# Patient Record
Sex: Female | Born: 1935 | Race: White | Hispanic: No | Smoking: Former smoker
Health system: Southern US, Community
[De-identification: ages and names within clinical notes are randomized; demographics above are authoritative.]

## PROBLEM LIST (undated history)

## (undated) DIAGNOSIS — E039 Hypothyroidism, unspecified: Secondary | ICD-10-CM

## (undated) DIAGNOSIS — K5792 Diverticulitis of intestine, part unspecified, without perforation or abscess without bleeding: Secondary | ICD-10-CM

## (undated) DIAGNOSIS — M549 Dorsalgia, unspecified: Secondary | ICD-10-CM

## (undated) DIAGNOSIS — K219 Gastro-esophageal reflux disease without esophagitis: Secondary | ICD-10-CM

## (undated) DIAGNOSIS — I10 Essential (primary) hypertension: Secondary | ICD-10-CM

## (undated) DIAGNOSIS — F0390 Unspecified dementia without behavioral disturbance: Secondary | ICD-10-CM

## (undated) DIAGNOSIS — I5032 Chronic diastolic (congestive) heart failure: Secondary | ICD-10-CM

## (undated) DIAGNOSIS — N289 Disorder of kidney and ureter, unspecified: Secondary | ICD-10-CM

## (undated) DIAGNOSIS — Z7401 Bed confinement status: Secondary | ICD-10-CM

## (undated) DIAGNOSIS — Z950 Presence of cardiac pacemaker: Secondary | ICD-10-CM

## (undated) DIAGNOSIS — F419 Anxiety disorder, unspecified: Secondary | ICD-10-CM

## (undated) DIAGNOSIS — E785 Hyperlipidemia, unspecified: Secondary | ICD-10-CM

## (undated) DIAGNOSIS — M40209 Unspecified kyphosis, site unspecified: Secondary | ICD-10-CM

## (undated) DIAGNOSIS — I495 Sick sinus syndrome: Secondary | ICD-10-CM

## (undated) DIAGNOSIS — K589 Irritable bowel syndrome without diarrhea: Secondary | ICD-10-CM

## (undated) DIAGNOSIS — I509 Heart failure, unspecified: Secondary | ICD-10-CM

## (undated) HISTORY — PX: CARDIAC PACEMAKER PLACEMENT: SHX583

## (undated) HISTORY — PX: APPENDECTOMY: SHX54

## (undated) HISTORY — PX: COLECTOMY: SHX59

## (undated) HISTORY — DX: Essential (primary) hypertension: I10

## (undated) HISTORY — PX: OTHER SURGICAL HISTORY: SHX169

## (undated) HISTORY — PX: ABDOMINAL HYSTERECTOMY: SHX81

## (undated) HISTORY — DX: Gastro-esophageal reflux disease without esophagitis: K21.9

## (undated) HISTORY — DX: Irritable bowel syndrome, unspecified: K58.9

## (undated) HISTORY — DX: Presence of cardiac pacemaker: Z95.0

## (undated) HISTORY — PX: PACEMAKER PLACEMENT: SHX43

## (undated) HISTORY — DX: Sick sinus syndrome: I49.5

## (undated) HISTORY — DX: Hypothyroidism, unspecified: E03.9

## (undated) HISTORY — DX: Unspecified kyphosis, site unspecified: M40.209

## (undated) HISTORY — PX: CHOLECYSTECTOMY: SHX55

## (undated) HISTORY — DX: Hyperlipidemia, unspecified: E78.5

## (undated) HISTORY — DX: Diverticulitis of intestine, part unspecified, without perforation or abscess without bleeding: K57.92

## (undated) HISTORY — DX: Chronic diastolic (congestive) heart failure: I50.32

## (undated) HISTORY — PX: ECTOPIC PREGNANCY SURGERY: SHX613

## (undated) HISTORY — DX: Dorsalgia, unspecified: M54.9

---

## 2000-06-07 ENCOUNTER — Ambulatory Visit (HOSPITAL_COMMUNITY): Admission: RE | Admit: 2000-06-07 | Discharge: 2000-06-07 | Payer: Self-pay | Admitting: *Deleted

## 2000-06-07 ENCOUNTER — Encounter: Payer: Self-pay | Admitting: *Deleted

## 2000-07-18 ENCOUNTER — Ambulatory Visit (HOSPITAL_COMMUNITY): Admission: RE | Admit: 2000-07-18 | Discharge: 2000-07-18 | Payer: Self-pay | Admitting: Family Medicine

## 2000-07-18 ENCOUNTER — Encounter: Payer: Self-pay | Admitting: Family Medicine

## 2001-02-13 HISTORY — PX: COLONOSCOPY: SHX174

## 2001-10-30 ENCOUNTER — Ambulatory Visit (HOSPITAL_COMMUNITY): Admission: RE | Admit: 2001-10-30 | Discharge: 2001-10-30 | Payer: Self-pay | Admitting: Family Medicine

## 2001-10-30 ENCOUNTER — Encounter: Payer: Self-pay | Admitting: Family Medicine

## 2001-11-19 ENCOUNTER — Ambulatory Visit (HOSPITAL_COMMUNITY): Admission: RE | Admit: 2001-11-19 | Discharge: 2001-11-19 | Payer: Self-pay | Admitting: General Surgery

## 2002-08-25 ENCOUNTER — Ambulatory Visit (HOSPITAL_COMMUNITY): Admission: RE | Admit: 2002-08-25 | Discharge: 2002-08-25 | Payer: Self-pay | Admitting: *Deleted

## 2002-09-01 ENCOUNTER — Encounter (HOSPITAL_COMMUNITY): Admission: RE | Admit: 2002-09-01 | Discharge: 2002-10-01 | Payer: Self-pay | Admitting: *Deleted

## 2003-04-27 ENCOUNTER — Emergency Department (HOSPITAL_COMMUNITY): Admission: EM | Admit: 2003-04-27 | Discharge: 2003-04-27 | Payer: Self-pay | Admitting: Emergency Medicine

## 2003-10-24 ENCOUNTER — Emergency Department (HOSPITAL_COMMUNITY): Admission: EM | Admit: 2003-10-24 | Discharge: 2003-10-24 | Payer: Self-pay | Admitting: Emergency Medicine

## 2004-11-07 ENCOUNTER — Emergency Department (HOSPITAL_COMMUNITY): Admission: EM | Admit: 2004-11-07 | Discharge: 2004-11-07 | Payer: Self-pay | Admitting: Emergency Medicine

## 2004-11-21 ENCOUNTER — Ambulatory Visit (HOSPITAL_COMMUNITY): Admission: RE | Admit: 2004-11-21 | Discharge: 2004-11-21 | Payer: Self-pay | Admitting: Family Medicine

## 2005-05-22 ENCOUNTER — Ambulatory Visit (HOSPITAL_COMMUNITY): Admission: RE | Admit: 2005-05-22 | Discharge: 2005-05-22 | Payer: Self-pay | Admitting: Family Medicine

## 2005-05-26 ENCOUNTER — Ambulatory Visit: Payer: Self-pay | Admitting: Cardiology

## 2005-06-26 ENCOUNTER — Ambulatory Visit: Payer: Self-pay | Admitting: Cardiology

## 2005-07-06 ENCOUNTER — Ambulatory Visit: Payer: Self-pay | Admitting: Cardiology

## 2005-07-11 ENCOUNTER — Ambulatory Visit: Payer: Self-pay | Admitting: Internal Medicine

## 2005-07-11 ENCOUNTER — Inpatient Hospital Stay (HOSPITAL_COMMUNITY): Admission: RE | Admit: 2005-07-11 | Discharge: 2005-07-12 | Payer: Self-pay | Admitting: Internal Medicine

## 2005-07-13 ENCOUNTER — Ambulatory Visit: Payer: Self-pay | Admitting: *Deleted

## 2005-07-17 ENCOUNTER — Encounter (HOSPITAL_COMMUNITY): Admission: RE | Admit: 2005-07-17 | Discharge: 2005-08-16 | Payer: Self-pay | Admitting: Internal Medicine

## 2005-07-20 ENCOUNTER — Ambulatory Visit: Payer: Self-pay | Admitting: *Deleted

## 2005-07-26 ENCOUNTER — Ambulatory Visit: Payer: Self-pay

## 2005-10-31 ENCOUNTER — Ambulatory Visit: Payer: Self-pay | Admitting: Internal Medicine

## 2006-05-24 ENCOUNTER — Ambulatory Visit (HOSPITAL_COMMUNITY): Admission: RE | Admit: 2006-05-24 | Discharge: 2006-05-24 | Payer: Self-pay | Admitting: Family Medicine

## 2006-05-24 ENCOUNTER — Ambulatory Visit: Payer: Self-pay | Admitting: Cardiology

## 2006-06-27 ENCOUNTER — Ambulatory Visit (HOSPITAL_COMMUNITY): Admission: RE | Admit: 2006-06-27 | Discharge: 2006-06-27 | Payer: Self-pay | Admitting: Ophthalmology

## 2006-07-12 ENCOUNTER — Ambulatory Visit (HOSPITAL_COMMUNITY): Admission: RE | Admit: 2006-07-12 | Discharge: 2006-07-12 | Payer: Self-pay | Admitting: Ophthalmology

## 2006-08-22 ENCOUNTER — Ambulatory Visit: Payer: Self-pay | Admitting: Internal Medicine

## 2006-10-19 ENCOUNTER — Ambulatory Visit (HOSPITAL_COMMUNITY): Admission: RE | Admit: 2006-10-19 | Discharge: 2006-10-19 | Payer: Self-pay | Admitting: Family Medicine

## 2006-11-21 ENCOUNTER — Ambulatory Visit: Payer: Self-pay | Admitting: Internal Medicine

## 2007-02-20 ENCOUNTER — Ambulatory Visit: Payer: Self-pay | Admitting: Internal Medicine

## 2007-04-19 ENCOUNTER — Ambulatory Visit: Payer: Self-pay | Admitting: Internal Medicine

## 2007-07-17 ENCOUNTER — Ambulatory Visit: Payer: Self-pay | Admitting: Internal Medicine

## 2007-07-19 ENCOUNTER — Ambulatory Visit: Payer: Self-pay | Admitting: Cardiology

## 2007-07-24 ENCOUNTER — Ambulatory Visit: Payer: Self-pay | Admitting: Cardiology

## 2007-09-05 ENCOUNTER — Ambulatory Visit: Payer: Self-pay | Admitting: Cardiology

## 2007-10-16 ENCOUNTER — Ambulatory Visit: Payer: Self-pay | Admitting: Internal Medicine

## 2007-10-16 ENCOUNTER — Ambulatory Visit (HOSPITAL_COMMUNITY): Admission: RE | Admit: 2007-10-16 | Discharge: 2007-10-16 | Payer: Self-pay | Admitting: Internal Medicine

## 2007-12-02 ENCOUNTER — Ambulatory Visit: Payer: Self-pay | Admitting: Cardiology

## 2008-01-15 ENCOUNTER — Ambulatory Visit: Payer: Self-pay | Admitting: Internal Medicine

## 2008-01-28 ENCOUNTER — Ambulatory Visit (HOSPITAL_COMMUNITY): Admission: RE | Admit: 2008-01-28 | Discharge: 2008-01-28 | Payer: Self-pay | Admitting: Internal Medicine

## 2008-04-10 ENCOUNTER — Encounter: Payer: Self-pay | Admitting: Internal Medicine

## 2008-04-15 ENCOUNTER — Ambulatory Visit: Payer: Self-pay | Admitting: Internal Medicine

## 2008-06-24 ENCOUNTER — Ambulatory Visit (HOSPITAL_COMMUNITY): Admission: RE | Admit: 2008-06-24 | Discharge: 2008-06-24 | Payer: Self-pay | Admitting: Internal Medicine

## 2008-07-15 ENCOUNTER — Ambulatory Visit: Payer: Self-pay | Admitting: Internal Medicine

## 2008-07-23 ENCOUNTER — Encounter: Payer: Self-pay | Admitting: Internal Medicine

## 2008-10-04 ENCOUNTER — Emergency Department (HOSPITAL_COMMUNITY): Admission: EM | Admit: 2008-10-04 | Discharge: 2008-10-04 | Payer: Self-pay | Admitting: Emergency Medicine

## 2008-10-14 ENCOUNTER — Encounter: Payer: Self-pay | Admitting: Internal Medicine

## 2008-10-14 ENCOUNTER — Ambulatory Visit: Payer: Self-pay | Admitting: Internal Medicine

## 2008-11-19 ENCOUNTER — Encounter: Payer: Self-pay | Admitting: Internal Medicine

## 2009-01-20 ENCOUNTER — Encounter: Payer: Self-pay | Admitting: Internal Medicine

## 2009-01-28 ENCOUNTER — Encounter: Payer: Self-pay | Admitting: Internal Medicine

## 2009-04-30 ENCOUNTER — Ambulatory Visit (HOSPITAL_COMMUNITY): Admission: RE | Admit: 2009-04-30 | Discharge: 2009-04-30 | Payer: Self-pay | Admitting: Family Medicine

## 2009-06-02 DIAGNOSIS — F17200 Nicotine dependence, unspecified, uncomplicated: Secondary | ICD-10-CM

## 2009-06-02 DIAGNOSIS — E785 Hyperlipidemia, unspecified: Secondary | ICD-10-CM

## 2009-06-02 DIAGNOSIS — E039 Hypothyroidism, unspecified: Secondary | ICD-10-CM | POA: Insufficient documentation

## 2009-06-02 DIAGNOSIS — I10 Essential (primary) hypertension: Secondary | ICD-10-CM

## 2009-06-09 ENCOUNTER — Ambulatory Visit: Payer: Self-pay | Admitting: Internal Medicine

## 2009-06-09 DIAGNOSIS — Z950 Presence of cardiac pacemaker: Secondary | ICD-10-CM | POA: Insufficient documentation

## 2009-06-10 ENCOUNTER — Encounter: Payer: Self-pay | Admitting: Internal Medicine

## 2009-08-26 ENCOUNTER — Ambulatory Visit (HOSPITAL_COMMUNITY): Admission: RE | Admit: 2009-08-26 | Discharge: 2009-08-26 | Payer: Self-pay | Admitting: Family Medicine

## 2009-09-10 ENCOUNTER — Encounter: Payer: Self-pay | Admitting: Internal Medicine

## 2009-10-07 ENCOUNTER — Encounter: Payer: Self-pay | Admitting: Orthopedic Surgery

## 2009-10-14 ENCOUNTER — Encounter: Payer: Self-pay | Admitting: Orthopedic Surgery

## 2009-10-24 ENCOUNTER — Emergency Department (HOSPITAL_COMMUNITY): Admission: EM | Admit: 2009-10-24 | Discharge: 2009-10-24 | Payer: Self-pay | Admitting: Emergency Medicine

## 2009-11-13 ENCOUNTER — Encounter: Payer: Self-pay | Admitting: Orthopedic Surgery

## 2010-01-16 ENCOUNTER — Encounter: Payer: Self-pay | Admitting: Internal Medicine

## 2010-01-17 ENCOUNTER — Ambulatory Visit: Payer: Self-pay | Admitting: Cardiology

## 2010-01-17 ENCOUNTER — Ambulatory Visit: Payer: Self-pay | Admitting: Internal Medicine

## 2010-01-18 ENCOUNTER — Encounter: Payer: Self-pay | Admitting: Adult Health

## 2010-01-28 ENCOUNTER — Encounter: Payer: Self-pay | Admitting: Internal Medicine

## 2010-03-06 ENCOUNTER — Encounter: Payer: Self-pay | Admitting: Internal Medicine

## 2010-03-15 ENCOUNTER — Ambulatory Visit
Admission: RE | Admit: 2010-03-15 | Discharge: 2010-03-15 | Payer: Self-pay | Source: Home / Self Care | Attending: Gastroenterology | Admitting: Gastroenterology

## 2010-03-15 DIAGNOSIS — K589 Irritable bowel syndrome without diarrhea: Secondary | ICD-10-CM | POA: Insufficient documentation

## 2010-03-15 DIAGNOSIS — R1013 Epigastric pain: Secondary | ICD-10-CM | POA: Insufficient documentation

## 2010-03-15 NOTE — Assessment & Plan Note (Signed)
Summary: F2Y  Medications Added ASPIR-LOW 81 MG TBEC (ASPIRIN) take 1 tab daily ATENOLOL 50 MG TABS (ATENOLOL) take 1 tab daily PRILOSEC 20 MG CPDR (OMEPRAZOLE) take 1 tab daily      Allergies Added: NKDA  Visit Type:  Follow-up Primary Climmie Cronce:  Dr.Fusco  CC:  some chest pain.  History of Present Illness: Kari Hall is a pleasant 76 y/o CF we are following for continued assessement and treatment of SSS s/p pacemaker implantation by Dr. Graciela Husbands on 07/11/2005, hypertension, former tobacco abuse who has been lost to cardiac follow-up with the exception of pacemaker checks.  She is followed by Dr. Sherwood Gambler for cholesterol checks and thyroid assessement with known history of hypothyroidism.  She comes today as a walk-in  secondary to recurrent chest discomfort around the pacemaker site when she lays on her left side.  She denies chest discomfort when she is exerting herself, none that wakes her up, no associated symptoms of SOB, dizziness, or diaphoresis. This has concerned her over the last couple of weeks. She says that she is doing rehb exercises for chronic back pain in the setting of kyphosis. Since starting these exercises, the chest discomfort has begun.  Current Medications (verified): 1)  Levoxyl 75 Mcg Tabs (Levothyroxine Sodium) .... Take 1 Tab Daily 2)  Vytorin 10-40 Mg Tabs (Ezetimibe-Simvastatin) .... Take 1 Tab Daily 3)  Multivitamins  Caps (Multiple Vitamin) .... Take 1 Tab Daily 4)  Aspir-Low 81 Mg Tbec (Aspirin) .... Take 1 Tab Daily 5)  Atenolol 50 Mg Tabs (Atenolol) .... Take 1 Tab Daily 6)  Prilosec 20 Mg Cpdr (Omeprazole) .... Take 1 Tab Daily  Allergies (verified): No Known Drug Allergies  Comments:  Nurse/Medical Assistant: patient didn't bring meds or list stated all meds she uses the [facility]  we reviewed meds from her ov with gregg taylor  Past History:  Past medical, surgical, family and social histories (including risk factors) reviewed, and no  changes noted (except as noted below).  Past Medical History: Reviewed history from 06/02/2009 and no changes required. Current Problems:  TOBACCO ABUSE (ICD-305.1) DYSLIPIDEMIA (ICD-272.4) HYPERTENSION (ICD-401.9) HYPOTHYROIDISM (ICD-244.9)  Past Surgical History: Reviewed history from 06/02/2009 and no changes required. cholecystectomy hysterectomy appendectomy colectomy  tubal pregnancy  Family History: Reviewed history and no changes required.  Social History: Reviewed history from 06/02/2009 and no changes required. Full Time Tobacco Use - Former.  Alcohol Use - no Regular Exercise - no Drug Use - no  Review of Systems       All other systems have been reviewed and are negative unless stated above.   Vital Signs:  Patient profile:   76 year old female Weight:      179 pounds BMI:     29.00 O2 Sat:      94 % on Room air Pulse rate:   74 / minute BP sitting:   160 / 87  (right arm)  Vitals Entered By: Dreama Saa, CNA (January 17, 2010 2:54 PM)  O2 Flow:  Room air  Physical Exam  General:  Well developed, well nourished, in no acute distress. Chest Wall:  Tender over pacemaker site on palpation. Lungs:  Clear bilaterally to auscultation and percussion. Heart:  Non-displaced PMI, chest non-tender; regular rate and rhythm, S1, S2 without murmurs, rubs or gallops. Carotid upstroke normal, no bruit. Normal abdominal aortic size, no bruits. Femorals normal pulses, no bruits. Pedals normal pulses. No edema, no varicosities. Abdomen:  Bowel sounds positive; abdomen soft and  non-tender without masses, organomegaly, or hernias noted. No hepatosplenomegaly. Msk:  Kyphosis with mild scolosis to the Left. Pulses:  pulses normal in all 4 extremities Extremities:  No clubbing or cyanosis. Neurologic:  Alert and oriented x 3. Psych:  anxious.     EKG  Procedure date:  01/17/2010  Findings:       Atrium and ventricle are paced.    PPM  Specifications Following MD:  Lewayne Bunting, MD     PPM Vendor:  Medtronic     PPM Model Number:  [id number]     PPM Serial Number:  [id number] PPM DOI:  07/11/2005     PPM Implanting MD:  Sherryl Manges, MD  Lead 1    Location: [facility]     DOI: 07/11/2005     Model #: 7829     Serial #: [id number]     Status: active Lead 2    Location: RV     DOI: 07/11/2005     Model #: [id number]     Serial #: [id number]     Status: active  Magnet Response Rate:  BOL 85 ERI  65  Indications:  Sinus node dysfunction  Explantation Comments:  [facility]link  PPM Follow Up Pacer Dependent:  No      Parameters Mode:  DDDR     Lower Rate Limit:  50     Upper Rate Limit:  130 Paced AV Delay:  350     Sensed AV Delay:  350 Rate Response Parameters:  ADL repsonse reprogrammed 2  Impression & Recommendations:  Problem # 1:  CARDIAC PACEMAKER IN SITU (ICD-V45.01) She is experiencing pain over the pacemaker site on palpation and lying on left side, relieved with changing sides when she sleeps.  She appears to be having musculoskeletal pain and not cardiac pain.  Will give ultram 50mg  tablets for pain relief and have her see Dr. Ladona Ridgel for follow-up on his next available.  If pain is persistant, can consider stress test for evaluation of ischemia.  Problem # 2:  HYPERTENSION (ICD-401.9) Currently elevated, but patient has been in a hurry to get her on time.  Will check again on next appointment and make no changes in medications at this time. Her updated medication list for this problem includes:    Aspir-low 81 Mg Tbec (Aspirin) .Marland Kitchen... Take 1 tab daily    Atenolol 50 Mg Tabs (Atenolol) .Marland Kitchen... Take 1 tab daily  Appended Document: F2Y    Clinical Lists Changes  Medications: Added new medication of ULTRAM 50 MG TABS (TRAMADOL HCL) take 1 to 2 tablets at bedtime by mouth as needed for pain - Signed Rx of ULTRAM 50 MG TABS (TRAMADOL HCL) take 1 to 2 tablets at bedtime by mouth as needed for pain;  #60 x 0;  Signed;  Entered by: Larita Fife  Via LPN;  Authorized by: Joni Reining, NP;  Method used: Print then Give to Patient    Prescriptions: ULTRAM 50 MG TABS (TRAMADOL HCL) take 1 to 2 tablets at bedtime by mouth as needed for pain  #60 x 0   Entered by:   Larita Fife Via LPN   Authorized by:   Joni Reining, NP   Signed by:   Larita Fife Via LPN on 44/02/270   Method used:   Print then Give to Patient   RxID:   [id number]

## 2010-03-15 NOTE — Assessment & Plan Note (Signed)
Summary: 1 YR PACER CK PER CKOUT 04/15/08-DSF  Medications Added DRISDOL 65784 UNIT CAPS (ERGOCALCIFEROL) take one by mouth weekly      Allergies Added: NKDA  Visit Type:  Pacemaker check Primary Provider:  [facility]  CC:  pacer check.  History of Present Illness: Ms. Helfrich returns today for followup. She is a pleasant 76 yo woman with a h/o symptomatic bradycardia, s/p PPM, and HTN.  She denies c/p or sob.  She does have some fatigue with exertion. Minimal peripheral edema.  Preventive Screening-Counseling & Management  Alcohol-Tobacco     Smoking Status: quit     Year Quit: 1970's  Current Medications (verified): 1)  Levoxyl 75 Mcg Tabs (Levothyroxine Sodium) .... Take 1 Tab Daily 2)  Vytorin 10-40 Mg Tabs (Ezetimibe-Simvastatin) .... Take 1 Tab Daily 3)  Multivitamins  Caps (Multiple Vitamin) .... Take 1 Tab Daily 4)  Drisdol 69629 Unit Caps (Ergocalciferol) .... Take One By Mouth Weekly  Allergies (verified): No Known Drug Allergies  Comments:  Nurse/Medical Assistant: The patient's medications and allergies were reviewed with the patient and were updated in the Medication and Allergy Lists. Verbally gave names.  Past History:  Past Medical History: Last updated: 06/02/2009 Current Problems:  TOBACCO ABUSE (ICD-305.1) DYSLIPIDEMIA (ICD-272.4) HYPERTENSION (ICD-401.9) HYPOTHYROIDISM (ICD-244.9)  Past Surgical History: Last updated: 06/02/2009 cholecystectomy hysterectomy appendectomy colectomy  tubal pregnancy  Review of Systems  The patient denies chest pain, syncope, dyspnea on exertion, and peripheral edema.    Vital Signs:  Patient profile:   76 year old female Height:      66 inches Weight:      181 pounds BMI:     29.32 Pulse rate:   79 / minute BP sitting:   127 / 71  (left arm) Cuff size:   large  Vitals Entered By: Roxy Cedar  Nutrition Counseling: Patient's BMI is greater than 25 and therefore counseled on weight management  options. CC: pacer check   Physical Exam  General:  Well developed, well nourished, in no acute distress.  HEENT: normal Neck: supple. No JVD. Carotids 2+ bilaterally no bruits Cor: RRR no rubs, gallops or murmur Lungs: CTA. Well healed PPM incision. Ab: soft, nontender. nondistended. No HSM. Good bowel sounds Ext: warm. no cyanosis, clubbing or edema Neuro: alert and oriented. Grossly nonfocal. affect pleasant    PPM Specifications Following MD:  Lewayne Bunting, MD     PPM Vendor:  [employer]tronic     PPM Model Number:  [id number]     PPM Serial Number:  [id number] PPM DOI:  07/11/2005     PPM Implanting MD:  Sherryl Manges, MD  Lead 1    Location: RA     DOI: 07/11/2005     Model #: [id number]     Serial #: [id number]     Status: active Lead 2    Location: RV     DOI: 07/11/2005     Model #: 4742     Serial #: [id number]     Status: active  Magnet Response Rate:  BOL 85 ERI  65  Indications:  Sinus node dysfunction  Explantation Comments:  Carelink  PPM Follow Up Remote Check?  No Battery Voltage:  2.78 V     Battery Est. Longevity:  5.5 years     Pacer Dependent:  No       PPM Device Measurements Atrium  Amplitude: 5.6 mV, Impedance: 426 ohms, Threshold: 0.625 V at 0.4 msec Right Ventricle  Amplitude: 11.2 mV, Impedance:  546 ohms, Threshold: 0.75 V at 0.4 msec  Episodes MS Episodes:  2     Percent Mode Switch:  <0.1%     Ventricular High Rate:  0     Atrial Pacing:  70.1%     Ventricular Pacing:  86.7%  Parameters Mode:  DDDR     Lower Rate Limit:  50     Upper Rate Limit:  130 Paced AV Delay:  350     Sensed AV Delay:  350 Rate Response Parameters:  ADL repsonse reprogrammed 2 Next Remote Date:  09/09/2009     Next Cardiology Appt Due:  05/15/2010 Tech Comments:  c/o lack of energy, ADL response reprogrammed.  AV delay 350/350 with base rate lowered to 50.  Carelink transmissions every 3 hours.   ROV 1 year with Dr. Ladona Ridgel in [place]. Altha Harm, LPN  June 09, 2009 4:00 PM  MD  Comments:  Agree with above.  Impression & Recommendations:  Problem # 1:  CARDIAC PACEMAKER IN SITU (ICD-V45.01) Her device is working normally today.  I have reduced her lower rate to help promote intrinsic conduction. Will recheck in several months.  Problem # 2:  TOBACCO ABUSE (ICD-305.1) smoking cessation is encouraged.  Problem # 3:  HYPERTENSION (ICD-401.9) A low salt diet and continue medical therapy are recommended.

## 2010-03-15 NOTE — Letter (Signed)
Summary: Device-Delinquent Phone Journalist, newspaper, Main Office  [address]   [place], [place] [address]   Phone: [phone]  Fax: [phone]     September 10, 2009 MRN: [id number]   Kari Hall [address] [place], [place]  [address]   Dear Ms. DEVINNEY,  According to our records, you were scheduled for a device phone transmission on 09-09-2009 .     We did not receive any results from this check.  If you transmitted on your scheduled day, please call us to help troubleshoot your system.  If you forgot to send your transmission, please send one upon receipt of this letter.  Thank you,   Arch[facility]ural [facility]

## 2010-03-16 ENCOUNTER — Encounter: Payer: Self-pay | Admitting: Gastroenterology

## 2010-03-17 ENCOUNTER — Encounter: Payer: Self-pay | Admitting: Gastroenterology

## 2010-03-17 ENCOUNTER — Ambulatory Visit: Admit: 2010-03-17 | Payer: Self-pay | Admitting: Internal Medicine

## 2010-03-17 ENCOUNTER — Encounter: Payer: Self-pay | Admitting: Internal Medicine

## 2010-03-17 ENCOUNTER — Encounter (INDEPENDENT_AMBULATORY_CARE_PROVIDER_SITE_OTHER): Payer: PRIVATE HEALTH INSURANCE | Admitting: Internal Medicine

## 2010-03-17 DIAGNOSIS — I10 Essential (primary) hypertension: Secondary | ICD-10-CM

## 2010-03-17 DIAGNOSIS — I495 Sick sinus syndrome: Secondary | ICD-10-CM

## 2010-03-17 NOTE — Letter (Signed)
Summary: Remote Device Check  [employer], Main Office  [address]   [place], [place] [address]   Phone: [phone]  Fax: [phone]     January 28, 2010 MRN: [id number]   Kari Hall [address] [place], [place]  [address]   Dear Ms. VAWTER,   Your remote transmission was recieved and reviewed by your physician.  All diagnostics were within normal limits for you.   ___X___Your next office visit is scheduled for:   03-17-10 @ 1045 with Dr Ladona Ridgel.    Sincerely,  Vella Kohler

## 2010-03-17 NOTE — Cardiovascular Report (Signed)
Summary: Office Visit Remote   Office Visit Remote   Imported By: Roderic Ovens 01/31/2010 16:02:52  _____________________________________________________________________  External Attachment:    Type:   Image     Comment:   External Document

## 2010-03-21 LAB — CONVERTED CEMR LAB
Albumin: 4.1 g/dL (ref 3.5–5.2)
Calcium: 8.5 mg/dL (ref 8.4–10.5)
Creatinine, Ser: 0.97 mg/dL (ref 0.40–1.20)
Eosinophils Absolute: 0.2 10*3/uL (ref 0.0–0.7)
Eosinophils Relative: 4 % (ref 0–5)
Glucose, Bld: 90 mg/dL (ref 70–99)
HCT: 39.9 % (ref 36.0–46.0)
Lymphs Abs: 1.5 10*3/uL (ref 0.7–4.0)
MCHC: 32.8 g/dL (ref 30.0–36.0)
MCV: 93.9 fL (ref 78.0–100.0)
Neutrophils Relative %: 52 % (ref 43–77)
Potassium: 3.8 meq/L (ref 3.5–5.3)
RBC: 4.25 M/uL (ref 3.87–5.11)
Sodium: 144 meq/L (ref 135–145)
Total Protein: 6.2 g/dL (ref 6.0–8.3)
WBC: 4.6 10*3/uL (ref 4.0–10.5)

## 2010-03-23 NOTE — Letter (Signed)
Summary: REF BY [facility]  REF BY [facility]   Imported By: Rexene Alberts 03/16/2010 14:35:02  _____________________________________________________________________  External Attachment:    Type:   Image     Comment:   External Document

## 2010-03-23 NOTE — Letter (Signed)
Summary: AUTH FOR THE RELEASE OF MEDICAL INFO  AUTH FOR THE RELEASE OF MEDICAL INFO   Imported By: Rexene Alberts 03/16/2010 11:07:59  _____________________________________________________________________  External Attachment:    Type:   Image     Comment:   External Document

## 2010-03-23 NOTE — Assessment & Plan Note (Addendum)
Summary: IBS/SS   Visit Type:  Initial Consult Referring Provider:  [facility] Primary Care Provider:  Dr.Fusco  CC:  abd pain and loose stools.  History of Present Illness: Ms. Cockerell is a 76 year old pleasant Caucasian female who presents at the request of Dr. Regino Schultze secondary to abdominal pain and loose stools. Was taking pills for some type of infection X 5 days, prior to Christmas, which precipitated mid-abdominal/epigastric pain, followed by diarrhea.  Was given Bentyl on 1/18, which relieved the discomfort. Now, she denies any abdominal pain. Has reported hx of IBS per pt, normally more constipated than diarrhea. Pt actually presented to ED in [place] at one point secondary to pain.  Reports prior to taking Bentyl, would have immediate postprandial loose stools. No melena or hematochezia. Now reports one BM daily. Last colonoscopy several years ago, but these reports are not available.  Denies issues with reflux. Takes Prilosec each morning. No dysphagia or odynophagia. Is actually trying to lose weight, eating [employer] cereal for breakfast.   Current Medications (verified): 1)  Levoxyl 75 Mcg Tabs (Levothyroxine Sodium) .... Take 1 Tab Daily 2)  Vytorin 10-40 Mg Tabs (Ezetimibe-Simvastatin) .... Take 1 Tab Daily 3)  Multivitamins  Caps (Multiple Vitamin) .... Take 1 Tab Daily 4)  Aspir-Low 81 Mg Tbec (Aspirin) .... Take 1 Tab Daily 5)  Atenolol 50 Mg Tabs (Atenolol) .... Take 1 Tab Daily 6)  Prilosec 20 Mg Cpdr (Omeprazole) .... Take 1 Tab Daily 7)  Caltrate 600 1500 Mg Tabs (Calcium Carbonate) .... Once Daily 8)  Vicodin 5-500 Mg Tabs (Hydrocodone-Acetaminophen) .... Once Daily 9)  Dicyclomine Hcl 10 Mg Caps (Dicyclomine Hcl) .... 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  Alcohol Use - no Regular Exercise - no Drug Use - no  Review of Systems General:  Denies fever, chills, and anorexia. Eyes:  Denies blurring, irritation, and discharge. ENT:  Denies sore throat, hoarseness, and difficulty swallowing. CV:  Denies chest pains and syncope. Resp:  Denies dyspnea at rest and wheezing. GI:  Denies difficulty swallowing, pain on swallowing, nausea, indigestion/heartburn, abdominal pain, diarrhea, constipation, change in bowel habits, bloody BM's, and black BMs. GU:  Denies urinary burning and urinary frequency. MS:  Denies joint pain / LOM, joint swelling, and joint stiffness. Derm:  Denies rash, itching, and dry skin. Neuro:  Denies weakness and syncope. Psych:  Denies depression and anxiety. Endo:  Denies cold intolerance and heat intolerance. Heme:  Denies bruising and bleeding.  Vital Signs:  Patient profile:   76 year old female Height:      66 inches Weight:      180 pounds BMI:     29.16 Temp:     97.9 degrees F oral Pulse rate:   64 / minute BP sitting:   140 / 82  (left arm) Cuff size:   regular  Vitals Entered By: Hendricks Limes LPN (March 15, 2010 1:29 PM)  Physical Exam  General:  Well developed, well nourished, no acute distress. Head:  Normocephalic and atraumatic. Eyes:  sclera without icterus Neck:  Supple; no  masses or thyromegaly. Lungs:  Clear throughout to auscultation. Heart:  S1 S2 present Abdomen:  +BS, soft, non-tender, non-distended. No rebound or guarding. Without HSM.  Msk:  Symmetrical with no gross deformities. Normal posture. Pulses:  Normal pulses  noted. Extremities:  No clubbing, cyanosis, edema or deformities noted. Neurologic:  Alert and  oriented x4;  grossly normal neurologically. Skin:  Intact without significant lesions or rashes. Psych:  Alert and cooperative. Normal mood and affect.  Impression & Recommendations:  Problem # 1:  IBS (ICD-44.56) 76 year old pleasant Caucasian female with reported hx of IBS, usually constipation predominantly. Reports onset of mid-abdominal/epigastric pain prior to Christmas, preceeding loose stools, relieved after BM. +postprandial urgency. No melena or hematochezia. Last colonoscopy several years ago, but not done by [place]. No reports available. Prescribed Bentyl by PCP mid-January, abdominal discomfort completely resolved as well as postprandial urgency. On Prilosec as well, daily. Denies reflux-type symptoms, denies dysphagia/odynophagia. Likely symptoms r/t IBS flare. Doubt infectious etiology at this time. Symptoms have resolved as of this visit.  1. Obtain colonoscopy reports done at outside facility 2. Obtain records from hospital in [place] (radiology, labs, d/c summer as well as any OV from Dr. Regino Schultze from Nov/Dec (unsure name of antibiotic) 3. CBC/CMP 4. Continue Bentyl for now 5. Continue Prilosec daily 6. F/U in 3 mos.  Orders: T-CBC w/Diff ([id number]) T-Comprehensive Metabolic Panel ([id number]) Consultation Level III (46962)  Appended Document: IBS/SS 3 MONTH F/U OPV IS IN THE COMPUTER  Appended Document: IBS/SS received d/c summary. negative MI.  01/2010 H/H 11.8/34.3 colonoscopy Oct 2003 by Dr. Lovell Sheehan: few scattered diverticula in transverse and descending colon. otherwise normal. Repeat in 10 years.   Please NIC for 2013 colonoscopy. Repeat CBC in the next 4-6 weeks. Keep upcoming scheduled f/u appt.   Appended Document: IBS/SS lab order and letter mailed to pt  Appended Document: IBS/SS pt is aware of OV for 05/17/10 @ 0830 w/AS

## 2010-03-31 NOTE — Assessment & Plan Note (Signed)
Summary: pc2/hm   Vital Signs:  Patient profile:   76 year old female Weight:      180 pounds BMI:     29.16 Pulse rate:   76 / minute BP sitting:   142 / 87  (left arm)  Vitals Entered By: Dreama Saa, CNA (March 17, 2010 10:26 AM)  Referring Provider:  [id number] Primary Provider:  Dr.Fusco   History of Present Illness: Kari Hall returns today for followup. She is a pleasant 76 yo woman with a h/o HTN, dyslipidemia and bradycardia. She is s/p PPM. No c/p or sob. She remains active.   Current Medications (verified): 1)  Levoxyl 75 Mcg Tabs (Levothyroxine Sodium) .... Take 1 Tab Daily 2)  Vytorin 10-40 Mg Tabs (Ezetimibe-Simvastatin) .... Take 1 Tab Daily 3)  Multivitamins  Caps (Multiple Vitamin) .... Take 1 Tab Daily 4)  Aspir-Low 81 Mg Tbec (Aspirin) .... Take 1 Tab Daily 5)  Atenolol 50 Mg Tabs (Atenolol) .... Take 1 Tab Daily 6)  Prilosec 20 Mg Cpdr (Omeprazole) .... Take 1 Tab Daily 7)  Caltrate 600 1500 Mg Tabs (Calcium Carbonate) .... Once Daily 8)  Vicodin 5-500 Mg Tabs (Hydrocodone-Acetaminophen) .... Once Daily 9)  Dicyclomine Hcl 10 Mg Caps (Dicyclomine Hcl) .... Qid As Needed  Allergies (verified): No Known Drug Allergies  Comments:  Nurse/Medical Assistant: patient brought med list pharmacy [facility]  Past History:  Past Medical History: Last updated: 03/15/2010 GERD DYSLIPIDEMIA (ICD-272.4) HYPERTENSION (ICD-401.9) HYPOTHYROIDISM (ICD-244.9) IBS pacemaker  hx diverticulitis  Past Surgical History: Last updated: 03/15/2010 cholecystectomy hysterectomy appendectomy colectomy (secondary to diverticulitis) tubal pregnancy pacemaker  Review of Systems  The patient denies chest pain, syncope, dyspnea on exertion, and peripheral edema.    Physical Exam  General:  Well developed, well nourished, in no acute distress.  HEENT: normal Neck: supple. No JVD. Carotids 2+ bilaterally no bruits Cor: RRR no rubs, gallops or murmur Lungs:  CTA. Well healed PPM incision. Ab: soft, nontender. nondistended. No HSM. Good bowel sounds Ext: warm. no cyanosis, clubbing or edema Neuro: alert and oriented. Grossly nonfocal. affect pleasant    Impression & Recommendations:  Problem # 1:  CARDIAC PACEMAKER IN SITU (ICD-V45.01) Her device is working normally. Will recheck in several months.  Problem # 2:  HYPERTENSION (ICD-401.9) she will continue her current meds and maintain a low sodium diet. Her updated medication list for this problem includes:    Aspir-low 81 Mg Tbec (Aspirin) .Marland Kitchen... Take 1 tab daily    Atenolol 50 Mg Tabs (Atenolol) .Marland Kitchen... Take 1 tab daily  Problem # 3:  DYSLIPIDEMIA (ICD-272.4) I have asked her to maintain a low fat diet. Her updated medication list for this problem includes:    Vytorin 10-40 Mg Tabs (Ezetimibe-simvastatin) .Marland Kitchen... Take 1 tab daily  Patient Instructions: 1)  Your physician recommends that you schedule a follow-up appointment in: 1 year 2)  Your physician recommends that you continue on your current medications as directed. Please refer to the Current Medication list given to you today.      PPM Specifications Following MD:  Lewayne Bunting, MD     PPM Vendor:  [employer]     PPM Model Number:  [id number]     PPM Serial Number:  [id number] PPM DOI:  07/11/2005     PPM Implanting MD:  Sherryl Manges, MD  Lead 1    Location: RA     DOI: 07/11/2005     Model #: [id number]     Serial #:  [id number]     Status: active Lead 2    Location: RV     DOI: 07/11/2005     Model #: 5638     Serial #: [id number]     Status: active  Magnet Response Rate:  BOL 85 ERI  65  Indications:  Sinus node dysfunction  Explantation Comments:  [facility]  PPM Follow Up Remote Check?  No Battery Voltage:  2.78 V     Battery Est. Longevity:  5.5 years     Pacer Dependent:  No       PPM Device Measurements Atrium  Amplitude: 2.8 mV, Impedance: 425 ohms, Threshold: 0.75 V at 0.4 msec Right Ventricle  Amplitude: 11.20 mV,  Impedance: 542 ohms, Threshold: 0.75 V at 0.4 msec  Episodes MS Episodes:  6     Percent Mode Switch:  <0.1%     Coumadin:  No Ventricular High Rate:  0     Atrial Pacing:  23.1%     Ventricular Pacing:  7.9%  Parameters Mode:  DDDR     Lower Rate Limit:  50     Upper Rate Limit:  130 Paced AV Delay:  350     Sensed AV Delay:  350 Rate Response Parameters:  ADL repsonse reprogrammed 2 Next Remote Date:  06/16/2010     Next Cardiology Appt Due:  03/17/2011 Tech Comments:  No parameter changes. Device function normal.  Longest mode switch 2:21 minutes, - coumadin.  Ventricular rates sensed >140bpm during A-fib.  Carelink transmissions every 3 months.  ROV 1 year with Dr. Ladona Ridgel in [place]. Altha Harm, LPN  March 17, 2010 10:44 AM

## 2010-04-22 ENCOUNTER — Encounter (INDEPENDENT_AMBULATORY_CARE_PROVIDER_SITE_OTHER): Payer: Self-pay

## 2010-04-22 ENCOUNTER — Encounter: Payer: Self-pay | Admitting: Gastroenterology

## 2010-04-26 NOTE — Letter (Signed)
Summary: Recall, Labs Needed  [facility]  [address]   [place], [place] [address]   Phone: [phone]  Fax: [phone]    April 22, 2010  JOVONNA NICKELL [address] [facility] [address] [place], [place]  [address]  [place] May 27, 1934   Dear Ms. WETTSTEIN,   Our records indicate it is time to repeat your blood work.  You can take the enclosed form to the lab on or near the date indicated.  Please make note of the new location of the lab:   [address], 2nd floor   [facility]  Our office will call you within a week to ten business days with the results.  If you do not hear from [place] in 10 business days, you should call the office.  If you have any questions regarding this, call the office at [phone], and ask for the nurse.  Labs are due on 05/16/10.   Sincerely,    Hendricks Limes LPN  [facility] Ph: [phone]   Fax: [phone]

## 2010-04-26 NOTE — Letter (Signed)
Summary: RECORDS FROM [facility]  RECORDS FROM [facility]   Imported By: Rexene Alberts 04/22/2010 11:43:03  _____________________________________________________________________  External Attachment:    Type:   Image     Comment:   External Document

## 2010-04-26 NOTE — Letter (Signed)
Summary: [facility] PROC REPORT  [facility] PROC REPORT   Imported By: Rexene Alberts 04/22/2010 11:41:06  _____________________________________________________________________  External Attachment:    Type:   Image     Comment:   External Document

## 2010-04-26 NOTE — Letter (Signed)
Summary: [facility] EGD PROC REPORT  [facility] EGD PROC REPORT   Imported By: Rexene Alberts 04/22/2010 11:31:56  _____________________________________________________________________  External Attachment:    Type:   Image     Comment:   External Document

## 2010-05-03 NOTE — Assessment & Plan Note (Signed)
Summary: lab orders   

## 2010-05-04 ENCOUNTER — Encounter: Payer: Self-pay | Admitting: Gastroenterology

## 2010-05-17 ENCOUNTER — Ambulatory Visit: Payer: Medicare PPO | Admitting: Gastroenterology

## 2010-05-21 LAB — DIFFERENTIAL
Basophils Relative: 0 % (ref 0–1)
Lymphocytes Relative: 19 % (ref 12–46)
Lymphs Abs: 1.3 10*3/uL (ref 0.7–4.0)
Monocytes Relative: 12 % (ref 3–12)
Neutro Abs: 4.6 10*3/uL (ref 1.7–7.7)
Neutrophils Relative %: 66 % (ref 43–77)

## 2010-05-21 LAB — BASIC METABOLIC PANEL
Calcium: 7.3 mg/dL — ABNORMAL LOW (ref 8.4–10.5)
Calcium: 7.9 mg/dL — ABNORMAL LOW (ref 8.4–10.5)
Chloride: 108 mEq/L (ref 96–112)
Creatinine, Ser: 1.89 mg/dL — ABNORMAL HIGH (ref 0.4–1.2)
Creatinine, Ser: 2.29 mg/dL — ABNORMAL HIGH (ref 0.4–1.2)
GFR calc Af Amer: 25 mL/min — ABNORMAL LOW (ref >60)
GFR calc Af Amer: 32 mL/min — ABNORMAL LOW (ref >60)
GFR calc non Af Amer: 21 mL/min — ABNORMAL LOW (ref >60)
Sodium: 141 mEq/L (ref 135–145)
Sodium: 142 mEq/L (ref 135–145)

## 2010-05-21 LAB — TSH: TSH: 2.417 u[IU]/mL (ref 0.350–4.500)

## 2010-05-21 LAB — CBC
Hemoglobin: 13.9 g/dL (ref 12.0–15.0)
MCHC: 34.8 g/dL (ref 30.0–36.0)
RBC: 4.23 MIL/uL (ref 3.87–5.11)
WBC: 7.1 10*3/uL (ref 4.0–10.5)

## 2010-05-21 LAB — URINALYSIS, ROUTINE W REFLEX MICROSCOPIC
Hgb urine dipstick: NEGATIVE
Nitrite: NEGATIVE
Specific Gravity, Urine: 1.02 (ref 1.005–1.030)
Urobilinogen, UA: 0.2 mg/dL (ref 0.0–1.0)
pH: 6 (ref 5.0–8.0)

## 2010-06-16 ENCOUNTER — Encounter: Payer: PRIVATE HEALTH INSURANCE | Admitting: *Deleted

## 2010-06-19 ENCOUNTER — Encounter: Payer: Self-pay | Admitting: *Deleted

## 2010-06-28 NOTE — Letter (Signed)
September 05, 2007    Kirk Ruths, MD  [address]  [place], [place] [address]   RE:  ABRI, VACCA  MRN:  [id number]  /  DOB:  12-15-1934   Dear Kari Stable,   Ms. Spoon returns to the office for continued assessment and treatment of  palpitations.  Since she wore her event recorder, her symptoms have  lessened, but not completely resolved.  She notes only brief episodes.  Otherwise, she feels quite well and remains busy.   MEDICATIONS:  Unchanged from her last visit.   PHYSICAL EXAMINATION:  GENERAL:  A pleasant woman in no acute distress.  VITAL SIGNS:  The weight is 178, 2 pounds more than last month.  The  blood pressure 120/70, heart rate 75 and regular, and respirations 16.  NECK:  No jugular venous distention.  Normal carotid upstrokes without  bruits.  LUNGS:  Clear.  CARDIAC:  Normal first and second heart sounds.  Minimal systolic  murmur.  ABDOMEN:  Soft and nontender.  No organomegaly.  EXTREMITIES:  No edema.   Rhythm strip:  Normal sinus rhythm.   Event recorder was reviewed.  She had predominately normal sinus rhythm  with first-degree AV block.  She had a few PVCs and PACs.  She had a  emergence of wide complex rhythm at times that could represent  competition between her pacemaker and her native rhythm, but probably  represents an accelerated junctional rhythm or an AIVR.  She had  episodes of supraventricular tachycardia at a rate of 160.  These  typically lasted for a matter of minutes.  Most were asymptomatic, but  symptoms were reported on at least one occasion.   IMPRESSION:  Ms. Oaxaca as a supraventricular tachycardia at a rate of 160,  most likely atrial flutter.  This was mildly symptomatic.  She does not  have much in the way of risk factors for cardioembolism.  I do not  believe she needs warfarin, particularly since episodes are brief.  We  will start metoprolol 50 mg b.i.d. and reassess this nice woman in 3  months.    Sincerely,      Gerrit Friends.  Dietrich Pates, MD, [facility]  Electronically Signed    RMR/MedQ  DD: 09/05/2007  DT: 09/06/2007  Job #: [id number]

## 2010-06-28 NOTE — Assessment & Plan Note (Signed)
[facility]                         ELECTROPHYSIOLOGY OFFICE NOTE   GERIANNE, SIMONET                          MRN:          [id number]  DATE:04/19/2007                            DOB:          03-Mar-1934    HISTORY:  Ms. Filippone returns here for followup.  She is a very pleasant  elderly woman with a history of symptomatic bradycardia and sinus node  dysfunction status post pacemaker insertion.  She returns today for  followup.  The patient has had some intermittent chest discomfort and  palpitations over the last several months.  Otherwise she had no  specific complaints today.  She has not sought medical attention for  this.   PHYSICAL EXAMINATION:  GENERAL:  On exam today she is a pleasant well-  appearing woman in no acute distress.  VITAL SIGNS:  Blood pressure today was 140/76, pulse was 64 and regular,  respirations were 18.  Weight was 182 pounds.  NECK:  Revealed no jugular venous distention.  LUNGS:  Clear bilaterally to auscultation.  No wheezes, rales or rhonchi  are present.  CARDIOVASCULAR:  Regular rate and rhythm.  Normal S1 and S2.  EXTREMITIES:  Demonstrate no edema.   Interrogation of her pacemaker demonstrates a Medtronic Now[place].  The P  waves are 4 and the R waves 11 and the impedance 415 in the A and 517 in  the V.  The threshold 0.5 at 0.4 in the A and 1 at 0.4 in the V.  The  battery voltage was 2.79 volts.   Interrogation of her histograms demonstrate episodes of what probably  are SVT at rates up to 180 beats per minute for 3 minutes in duration.  I suspect this is an atrial tachycardia though other arrhythmias cannot  be excluded.   IMPRESSION:  1. Symptomatic bradycardia.  2. Status post pacemaker insertion.  3. Palpitations.   DISCUSSION:  Overall Ms. Parlow is stable and her pacemaker is working  normally.  We will plan to see her back in several months.     Doylene Canning. Ladona Ridgel, MD  Electronically Signed    GWT/MedQ   DD: 04/19/2007  DT: 04/21/2007  Job #: [id number]

## 2010-06-28 NOTE — Letter (Signed)
December 02, 2007    Kirk Ruths, MD  [address]  [place], [place] [address]   RE:  Kari Hall, Kari Hall  MRN:  [id number]  /  DOB:  09-24-1934   Dear Annette Stable,   Ms. Jagger returns to the office for continued evaluation of sick sinus  syndrome.  Since her last visit, she has substantially improved.  She  notes occasional and fairly brief palpitations.  She continues to have  chronic chest discomfort that is also infrequent, relatively mild and  unrelated to exertion.  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   CURRENT MEDICATIONS:  1. Prilosec 20 mg daily.  2. Levothyroxine 0.075 mg daily.  3. Aspirin 81 mg daily.  4. Atorvastatin 40 mg daily.  5. Atenolol 50 mg daily.  6. Calcium with vitamin D.   PHYSICAL EXAMINATION:  GENERAL:  A pleasant woman in no acute distress.  VITAL SIGNS:  The weight is 180, 2 pounds more than in July.  Blood  pressure 115/80, heart rate 70 and regular, respirations 14.  NECK:  No jugular venous distention; no carotid bruits.  LUNGS:  Clear.  CARDIAC:  Normal first and second heart sounds; fourth heart sound  present.  ABDOMEN:  Soft and nontender; no organomegaly.  EXTREMITIES:  No edema.   CBC in September was normal as was an anemia profile.  Chemistry profile  was normal in June.  Lipid profile was acceptable.  TSH was 2.1.   IMPRESSION:  Ms. Lampkins is doing well overall.  Her brief episodes of  supraventricular tachycardia are adequately controlled.  She is not  experiencing any serious symptoms.  Her chest discomfort has been  evaluated before without apparent ischemic etiology.  She has good  control of hypertension, hypothyroidism, and dyslipidemia.  I will see  this nice woman again in 6 months.    Sincerely,      Gerrit Friends. Dietrich Pates, MD, [facility]  Electronically Signed    RMR/MedQ  DD: 12/02/2007  DT: 12/03/2007  Job #: [id number]

## 2010-06-28 NOTE — Assessment & Plan Note (Signed)
[facility]                         ELECTROPHYSIOLOGY OFFICE NOTE   Kari, Hall                          MRN:          [id number]  DATE:04/15/2008                            DOB:          [birth year]    Kari Hall returns today for followup.  She is a very pleasant elderly  woman with sinus node dysfunction and borderline hypertension.  She  returns today for followup.  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  She  has some complaints of some peripheral edema today.  She denies sodium  indiscretion, but she does admit to drinking V8 juices several times per  day.  I carefully reminded her that V8 are full sodium, she did not know  this information.   Her medicines include:  1. Prilosec 20 a day.  2. Aspirin 81 a day.  3. Lipitor 40 a day.  4. Atenolol 50 a day.  5. Synthroid 75 mcg daily.   On exam, she is a pleasant 76 year old woman in no acute distress.  The  blood pressure was 130/74, the pulse was 68 and regular, the  respirations were 18, and the weight was 182 pounds.  Neck revealed no  jugular venous distention.  Lungs clear bilaterally to auscultation.  No  wheezes, rales, or rhonchi are present, and no increased work of  breathing.  Cardiovascular exam revealed a regular rate and rhythm.  Normal S1 and S2.  The abdominal exam was soft and nontender.  Extremities demonstrated no edema.   Interrogation of the patient's pacemaker demonstrates Medtronic [place].  The P-waves were greater than 2.  The R-waves were 8.  The impedance 420  in the A and 533 in the V.  The threshold 0.625 at 0.4 in the atrium and  1 at 0.4 in the right ventricle.  The battery voltage was 2.78 V.  She  was V pacing 38% and A pacing 46% of the time.   IMPRESSION:  1. Symptomatic bradycardia.  2. Status post pacemaker insertion.  3. Borderline hypertension, contributed to by dietary  indiscretion      with sodium.   DISCUSSION:  Kari Hall is stable.  I have encouraged her to maintain a low-  salt diet.  I have asked her to stop drinking the V8 juices that she has  been consuming.  Her pacemaker is working normally.  We will see her  back for pacemaker check in 1 year.     Doylene Canning. Ladona Ridgel, MD  Electronically Signed    GWT/MedQ  DD: 04/15/2008  DT: 04/16/2008  Job #: [id number]

## 2010-06-28 NOTE — Letter (Signed)
July 24, 2007    Kirk Ruths, M.D.  [address]  [place], [place] [address]   RE:  Kari Hall, Kari Hall  MRN:  [id number]  /  DOB:  Jan 25, 1935   Dear Annette Stable:   [name] returns to the office at your request for palpitations.  In  recent weeks, she has experienced individual and brief episodes that may  be recurrent over a period of hours or throughout the day.  She came to  the office for such symptoms about a week ago, but no arrhythmia was  captured while she was here.  She has no dyspnea nor chest discomfort.  Exercise tolerance is good.  Hypothyroidism is appropriately treated as  far as she knows.   CURRENT MEDICATIONS:  Include:  1. Prilosec 20 mg daily.  2. Levothyroxine 0.075 mg daily.  3. Aspirin 81 mg daily.  4. Atorvastatin 40 mg daily.   On exam, pleasant overweight woman in no acute distress.  The weight is 176, 6 pounds less than in March 2009.  Blood pressure  110/75.  Heart rate 66 and regular.  Respirations 17.  NECK:  No jugular venous distention; no carotid bruits.  LUNGS:  Clear.  CARDIAC:  Normal first heart sounds; slightly accentuated second heart  sound.  Modest basilar systolic ejection murmur.  ABDOMEN:  Soft and nontender; no masses; no organomegaly.  EXTREMITIES:  No edema; distal pulses intact.   Chemistry profile, CBC and TSH level pending.   IMPRESSION:  [name] has recurrent palpitations.  Most likely, this  represents a benign arrhythmia, perhaps premature atrial contractions or  premature ventricular contractions.  We will provide her with an event  recorder to document the nature of her arrhythmia.  I will see her after  that test has been completed.    Sincerely,      Gerrit Friends. Dietrich Pates, MD, [facility]  Electronically Signed    RMR/MedQ  DD: 07/24/2007  DT: 07/24/2007  Job #: [id number]

## 2010-07-01 NOTE — Assessment & Plan Note (Signed)
[facility]                         ELECTROPHYSIOLOGY OFFICE NOTE   LEZLIE, RITCHEY                          MRN:          [id number]  DATE:05/24/2006                            DOB:          [birth year]    Ms. Pourciau was seen today in the [facility] on May 24, 2006 for  followup of her Medtronic model No. VEDRO1 Versa.  Date of implant was  Jul 11, 2005, for sinus node dysfunction.  On interrogation of her  device today, her battery voltage is 2.78 with an estimated longevity of  7.5 to 10.5 years.  P-waves measured greater than 5.6 millivolts , with  an atrial capture threshold of 0.5 volts at 0.4 milliseconds and an  atrial lead impedence of 432 ohms.  R-waves measured 15.68 to 22.40  millivolts  with a ventricular pacing threshold of 1 volt at 0.4  milliseconds and an ventricular lead impedence of 553 ohms.  No changes  were made in her parameters.  She was enrolled in [facility] today and  will send transmissions in every 3 months, with a return office visit in  one year's times.      Altha Harm, LPN  Electronically Signed      Duke Salvia, MD, [facility]  Electronically Signed   PO/MedQ  DD: 05/24/2006  DT: 05/24/2006  Job #: [id number]

## 2010-07-01 NOTE — H&P (Signed)
Kari Hall, Kari Hall                   ACCOUNT NO.:  [id number]   MEDICAL RECORD NO.:  [id number]          PATIENT TYPE:  OIB   LOCATION:  2852                         FACILITY:  [facility]   PHYSICIAN:  Duke Salvia, M.D.  DATE OF BIRTH:  [birth year]   DATE OF ADMISSION:  07/11/2005  DATE OF DISCHARGE:                                HISTORY & PHYSICAL   CARDIOLOGIST:  Naples Bing, M.D.   PRIMARY CARE GIVER:  [facility] [place].   ELECTROPHYSIOLOGIST:  Duke Salvia, M.D.   ALLERGIES:  No known drug allergies.   PRESENTING PROBLEM:  Is this where they do pacemakers?   HISTORY OF PRESENT ILLNESS:  Kari Hall is a 76 year old female with a history  of presyncope which was correlated with PAUSES seen on an event monitor. She  is also shown to have short runs of SVT. Atenolol has been discontinued but  symptoms have not significantly improved. She has no history of frank  syncope. She presents for pacemaker implant.   PAST MEDICAL HISTORY:  1.  Treated hypothyroidism.  2.  Hypertension (formerly on hydrochlorothiazide/atenolol both now      discontinued).  3.  Dyslipidemia.  4.  Remote tobacco habituation.   PAST SURGICAL HISTORY:  1.  Status post cholecystectomy.  2.  Status post hysterectomy.  3.  Status post appendectomy.  4.  Status post partial colectomy secondary to diverticulitis.  5.  Tubal pregnancy.   SOCIAL HISTORY:  The patient lives in [place]. She does not partake of  alcoholic beverages or smoke tobacco anymore. 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   Echocardiogram July 2004, left ventricle is normal size, systolic function  normal, mildly impaired diastolic relaxation. Myoview July 2004, no  ischemia.   REVIEW OF SYSTEMS:  Negative review of systems. No fever, chills, nausea or  vomiting. No history of myocardial infarction or cerebrovascular accident or  peptic ulcer disease or diabetes. No untoward nasal discharge,  vertigo,  nonhealing ulcerations, rashes, chest pain, dyspnea on exertion, orthopnea,  paroxysmal nocturnal dyspnea, lower extremity edema, or syncope. Positives  are presyncope, thyroid insufficiency, hypertension.   MEDICATIONS:  1.  Levoxyl 75 mcg daily.  2.  Vytorin 10/40 daily at bedtime.  3.  Multivitamin daily.  4.  She could not use an aspirin daily.   PHYSICAL EXAMINATION:  VITAL SIGNS:  Temperature 97.1, pulses 80,  respirations 153/86, oxygen saturations are 96% on room air. Weight is 153.  GENERAL:  She is alert and oriented x3, no acute distress.  HEENT:  Eyes pupils equal round and reactive to light. Extraocular movements  intact. No nasal discharge.  NECK:  Supple, no carotid bruits auscultated. No jugular venous distention.  LUNGS:  Clear to auscultation and percussion bilaterally.  HEART:  Regular rate and rhythm without murmur.  ABDOMEN:  Soft, nontender, bowel sounds are present.  EXTREMITIES:  Show no evidence of edema, clubbing or cyanosis.  NEUROLOGIC:  No deficits noted.   IMPRESSION:  1.  Presyncope correlates with event monitor showing pauses.  2.  PSVT.  3.  Hypertension.  4.  Treated hypothyroidism.  5.  Dyslipidemia.  6.  Tubal pregnancy.  7.  Status post cholecystectomy, appendectomy, hysterectomy.   PLAN:  Pacemaker implantation, Dr. Sherryl Manges.      Maple Mirza, P.A.    ______________________________  Duke Salvia, M.D.    GM/MEDQ  D:  07/11/2005  T:  07/11/2005  Job:  [id number]

## 2010-07-01 NOTE — Procedures (Signed)
   NAME:  Kari Hall, Kari Hall                             ACCOUNT NO.:  [id number]   MEDICAL RECORD NO.:  [id number]                   PATIENT TYPE:  OUT   LOCATION:  RAD                                  FACILITY:  [facility]   PHYSICIAN:  Vida Roller, M.D.                DATE OF BIRTH:  01-Feb-1935   DATE OF PROCEDURE:  08/25/2002  DATE OF DISCHARGE:                                  ECHOCARDIOGRAM   TAPE NUMBER:  [id number]   TAPE COUNT:  [id number]   CLINICAL INFORMATION:  This is a 76 year old woman with an episode of  syncope.   TECHNICAL QUALITY:  Difficult, but not diagnostic.   M-MODE TRACINGS:  1. The aorta is 28 mm.  2. The left atrium is 40 mm.  3. The septum is 13 mm.  4. The posterior wall is 13 mm.  5. The left ventricular diastolic dimension is 41 mm.  6. The left ventricular systolic dimension is 33 mm.   2-D AND DOPPLER IMAGING:  1. The left ventricle is normal size with normal systolic function.  There     are no wall motion abnormalities seen.  Diastolic function seems to be     mildly impaired.  2. The right ventricle is normal size with normal systolic function.  3. Both atria are slightly enlarged, the left greater than the right.  There     is no obvious atrial septal defect.  4. The aortic valve is trileaflet and tricommisural with no evidence of     stenosis or regurgitation.  5. The mitral valve morphologically unremarkable with no stenosis or     regurgitation.  6. The tricuspid valve was morphologically unremarkable with no stenosis or     regurgitation.  7. The pulmonic valve was not well seen.  8. The ascending aorta looks mildly enlarged in the parasternal long axis     views.  Clinical correlation is advised.  9. The pericardium appears to have moderately extensive pericardial fat, but     there is no pericardial effusion seen.  10.      The inferior vena cava appears to be normal size.                                               Vida Roller,  M.D.    JH/MEDQ  D:  08/25/2002  T:  08/26/2002  Job:  [id number]

## 2010-07-01 NOTE — Assessment & Plan Note (Signed)
[facility]                                 ON-CALL NOTE   AVIRA, TILLISON                          MRN:          [id number]  DATE:08/12/2007                            DOB:          1935-01-16    PRIMARY CARDIOLOGIST:  Gerrit Friends. Dietrich Pates, MD, [facility] [address]   HISTORY:  [employer] called this evening at approximately 6:20 stating  that Ms. Muccio has had a run of atrial fibrillation with a rapid  ventricular rate, that she has activated the device.  Followup strip  showed atrial pacing.  They stated that they would send the information  to the office.   According to e-chart, the patient describes recurrent palpitations on  office visit of July 24, 2007.  I have left a message at the [facility], so that they may follow up on her events of this evening.      Joellyn Rued, PA-C  Electronically Signed      Jonelle Sidle, MD  Electronically Signed   EW/MedQ  DD: 08/12/2007  DT: 08/13/2007  Job #: [id number]

## 2010-07-01 NOTE — Assessment & Plan Note (Signed)
[facility]                           ELECTROPHYSIOLOGY OFFICE NOTE   Kari Hall, Kari Hall                          MRN:          [id number]  DATE:10/31/2005                            DOB:          11-20-1934    HISTORY:  Kari Hall returns today for follow up.  She is a very pleasant 76-  year-old woman with a history of symptomatic sinus nodal dysfunction and  pauses associated with presyncope who underwent permanent pacemaker  insertion by Dr. Graciela Husbands back in May 2007.  She returns for follow up as noted  above.   The patient has had no syncope and otherwise feels well.  She has noted very  mild amounts of peripheral edema which occurs when she has been on her feet  for a prolonged period of time and perhaps in relationship to salt  indiscretion.  This improves with wearing tighter fitting stockings.   PHYSICAL EXAMINATION:  GENERAL APPEARANCE:  On exam she is a pleasant woman  in no acute distress.  VITAL SIGNS:  Blood pressure today is 145/89 and the pulse is 79 and  regular.  Respirations are 18.  The weight is 163 pounds.  NECK:  The neck reveals no jugular venous distention.  LUNGS:  The lungs are clear bilaterally to auscultation.  HEART:  Cardiovascular exam reveals regular rate and rhythm with normal S1  and S2.  EXTREMITIES:  The extremities demonstrate no peripheral edema.   PACEMAKER INTERROGATION DATA:  Interrogation of her pacemaker demonstrates a  Medtronic Versa with P and R waves of 4 and 11.  The pacing impedance is 438  in the atrium and 551 in the ventricle with thresholds of 0.25 at 0.4 in the  atrium and 1 at 0.4 in the right ventricle.  The battery voltage is 2.78  volts.  Today her outputs are down to 2 at 0.4 in the atrium and 2.5 at 0.4  in the ventricle.   PLAN:  We will plan to see her back in our [place] in May for a  pacemaker evaluation.                                   Doylene Canning. Ladona Ridgel, MD    GWT/MedQ  DD:  10/31/2005  DT:  11/01/2005  Job #:  [id number]   cc:   Kirk Ruths, M.D.  Margarita Mail

## 2010-07-01 NOTE — Op Note (Signed)
Kari Hall, Kari Hall                   ACCOUNT NO.:  [id number]   MEDICAL RECORD NO.:  [id number]          PATIENT TYPE:  OIB   LOCATION:  4731                         FACILITY:  [facility]   PHYSICIAN:  Duke Salvia, M.D.  DATE OF BIRTH:  [birth year]   DATE OF PROCEDURE:  07/11/2005  DATE OF DISCHARGE:                                 OPERATIVE REPORT   PREOPERATIVE DIAGNOSIS:  Symptomatic sinus node dysfunction.   POSTOPERATIVE DIAGNOSIS:  Symptomatic sinus node dysfunction.   PROCEDURE:  Dual-chamber pacemaker implantation and contrast upper extremity  venogram.   SURGEON:  Duke Salvia, MD.   Following obtaining informed consent, the patient was brought to the  electrophysiology laboratory and placed on the fluoroscopic table in the  supine position.  After routine prep and drape of the left upper chest,  Lidocaine was infiltrated in the prepectoral and subclavicular region.  An  incision was made and then carried down to the layer of the prepectoral  fascia using electrocautery and sharp dissection.  A pocket was formed  similarly, hemostasis was obtained.   Thereafter, attention was turned to gaining access to the extrathoracic left  subclavian vein, which turned out to be quite difficult.  I actually  punctured the artery, held pressure on it for four minutes, and then we  undertook a venogram demonstrating a more caudal displacement of the vein  than had been anticipated and I actually must have walked right over it.  In  any case, we were then able to cannulate the vein on two separate occasions,  guidewires were placed and retained, and then 7-French tearaway introducer  sheaths were placed to assure passage sequentially, and then a Medtronics  5076, 58-cm, active fixation ventricular lead, Serial #[id number], and a  Medtronics 5076, 52-cm, active fixation atrial lead, Serial #[id number].  Under fluoroscopic guidance, these leads were manipulated to the right  ventricular  septum and the right atrial appendage where the bipolar R-wave  was 15 mV with a pacing impedance of 868 ohms, a threshold of 0.7-V at 0.5  msec, currented threshold was 1.0 MA, and there was no diaphragmatic pacing  at 10-V.   The bipolar P-wave was 4.1 mV with a pacing impedance of 2.1-V at 0.5 msec.  Shortly after lead deployment, the currented threshold was 2.3 MA and the  impedance was 979 ohms.  With these acceptable parameters recorded, the  leads were secured to the prepectoral fascia; the ventricular lead was  marked with a tie.  The leads were then attached to a Medtronic Versa VEDR-  01, __________, Serial #[id number].  Through the device, atrial pacing and  then presynchronous pacing were identified.  The pocket was copiously  irrigated with antibiotic containing saline solution, hemostasis was  assured, and the leads in the pulse generator were placed in the pocket and  secured to the prepectoral fascia.  The wound was then closed in three  layers in the normal fashion.  The wound was washed  and dried and then a Benzoin-Steri-Strip dressing was applied.  Needle  counts, sponge counts, and instrument  counts were correct at the end of the  procedure, according to the staff.  The patient tolerated the procedure  without apparent complication.           ______________________________  Duke Salvia, M.D.     SCK/MEDQ  D:  07/11/2005  T:  07/11/2005  Job:  [id number]   cc:   Kirk Ruths, M.D.  Fax: [phone]   Electrophysiology Laboratory   [facility]   [place] - [place], [place]

## 2010-07-01 NOTE — Procedures (Signed)
Kari Hall, Kari Hall                   ACCOUNT NO.:  [id number]   MEDICAL RECORD NO.:  [id number]           PATIENT TYPE:   LOCATION:                                FACILITY:  [facility]   PHYSICIAN:  Vida Roller, M.D.        DATE OF BIRTH:   DATE OF PROCEDURE:  DATE OF DISCHARGE:                                    STRESS TEST   HISTORY:  [name] is a 76 year old female with no known coronary artery  disease.  She was recently admitted to [facility] for placement of  a pacemaker.  She has subsequently had chest discomfort following pacemaker  placement. She had troponins that are mildly elevated to 0.07.  A  recommendation was for a stress test versus catheterization.  She wanted to  have a stress test done.  She denies any recurrent episodes of chest  discomfort since that time.   BASELINE DATA:  Electrocardiogram reveals sinus rhythm at 71 beats per  minute with a prolonged PR interval, poor R wave progression, blood pressure  is 140/68.   The patient exercised for a total of 5 minutes 6 seconds at Bruce protocol  stage II at 7 minutes.  Maximal heart rate was 146 beats per minute which is  97% of predicted maximum.  Maximal blood pressure is 188/72 and resolved  down to 138/78 in recovery.  EKG revealed no significant arrhythmias.  No  ischemic changes were noted.  She did have some mild upsloping ST depression  in the inferolateral leads.  She denies any chest discomfort or shortness of  breath.   Final images results are pending MD review.      Jae Dire, P.A. [facility]      Vida Roller, M.D.  Electronically Signed    AB/MEDQ  D:  07/20/2005  T:  07/20/2005  Job:  [id number]

## 2010-07-01 NOTE — H&P (Signed)
   NAME:  Kari Hall, Kari Hall                             ACCOUNT NO.:  [id number]   MEDICAL RECORD NO.:  [id number]                   PATIENT TYPE:   LOCATION:  RAD                                  FACILITY:  [facility]   PHYSICIAN:  Dalia Heading, M.D.               DATE OF BIRTH:  [birth year]   DATE OF ADMISSION:  11/19/2001  DATE OF DISCHARGE:                                HISTORY & PHYSICAL   CHIEF COMPLAINT:  Diverticulosis, epigastric pain.   HISTORY OF PRESENT ILLNESS:  The patient is a 76 year old white female who  was referred for endoscopic evaluation.  She has a history of  diverticulosis, status post partial colectomy seven years ago for  diverticulitis.  She denies any lightheadedness, weight loss, fever,  constipation, diarrhea, hematochezia, melena.  She does complain of bloating  and epigastric pain.  She does feel that she has a hiatal hernia.  She has  never had a colonoscopy.  She denies any hemorrhoidal problems.  There is no  immediate family history of colon carcinoma.   PAST MEDICAL HISTORY:  Possible irritable bowel disease, hypertension,  hypothyroidism.   PAST SURGICAL HISTORY:  Partial colectomy, hysterectomy, cholecystectomy,  tubal pregnancy.   CURRENT MEDICATIONS:  1. Nexium.  2. Atenolol.  3. Hydrochlorothiazide.  4. Levoxyl.  5. Hyoscyamine.   ALLERGIES:  No known drug allergies.   REVIEW OF SYSTEMS:  Unremarkable.   PHYSICAL EXAMINATION:  GENERAL:  The patient is a well-developed, well-  nourished white female in no acute distress.  VITAL SIGNS:  She is afebrile and vital signs are stable.  NECK:  Supple without lymphadenopathy.  LUNGS:  Clear to auscultation with equal breath sounds bilaterally.  HEART:  Regular rate and rhythm without S3, S4, or murmurs.  ABDOMEN:  Soft, nontender, nondistended.  No hepatosplenomegaly or masses  are noted.  RECTAL:  Examination was deferred to the procedure.   IMPRESSION:  Diverticulosis, epigastric pain.    PLAN:  The patient is scheduled for an esophagogastroduodenoscopy and  colonoscopy on November 19, 2001.  The risks and benefits of the procedures  including bleeding and perforation were fully explained to the patient, gave  informed consent.                                                 Dalia Heading, M.D.    MAJ/MEDQ  D:  11/14/2001  T:  11/15/2001  Job:  [id number]   cc:   Kari Duel, MD  [address]  [place]  [place] [address]  Fax: [phone]

## 2010-07-01 NOTE — Group Therapy Note (Signed)
   NAMESAOIRSE, LEGERE                               ACCOUNT NO.:  [id number]   MEDICAL RECORD NO.:  [id number]                   PATIENT TYPE:  PREC   LOCATION:                                       FACILITY:   PHYSICIAN:  Vida Roller, M.D.                DATE OF BIRTH:   DATE OF PROCEDURE:  09/01/2002  DATE OF DISCHARGE:                                    STRESS TEST   EXERCISE CARDIOLITE   INDICATION:  Kari Hall is a 76 year old female with no known coronary artery  disease who presents with an episode of orthopnea, PND, and near syncope.  Her cardiac risk factors include history of tobacco abuse, hypertension, and  age.   Baseline data shows sinus rhythm at 78 beats per minute with nonspecific ST  abnormalities.  Blood pressure is 146/98.   The patient exercised for a total of six minutes to 7.8 METs and Bruce  protocol stage 3.  Maximum blood pressure was 182/90 which resolved down to  168/88 in recovery.  Maximum heart rate achieved was 138 beats per minute  which is 90% of predicted maximum heart rate.  EKG shows few PVCs and no  ischemic changes.   Final images and results are pending M.D. review.     Amy Mercy Riding, P.A. [facility]                     Vida Roller, M.D.    AB/MEDQ  D:  09/01/2002  T:  09/02/2002  Job:  [id number]

## 2010-07-01 NOTE — Discharge Summary (Signed)
NAMEGIABELLA, DUHART                   ACCOUNT NO.:  [id number]   MEDICAL RECORD NO.:  [id number]          PATIENT TYPE:  OIB   LOCATION:  4731                         FACILITY:  [facility]   PHYSICIAN:  Duke Salvia, M.D.  DATE OF BIRTH:  [birth year]   DATE OF ADMISSION:  07/11/2005  DATE OF DISCHARGE:  07/12/2005                                 DISCHARGE SUMMARY   This patient has no known drug allergies.   PRINCIPAL DIAGNOSES:  1.  Discharging day 1 status post implant Medtronic dual-chamber pacemaker,      with intraoperative venogram.  2.  Sinus node dysfunction, with documented pauses correlating with      presyncopal episodes.  3.  Postoperative chest pain, with troponin I studies 0.07-0.05,      electrocardiogram nondiagnostic for acute myocardial infarction.  The      pain was relieved with sublingual nitroglycerin and intravenous      morphine.   SECONDARY DIAGNOSES:  1.  Treated hypothyroidism.  2.  Hypertension.  3.  Dyslipidemia.  4.  Remote tobacco.  5.  Tubal pregnancy.  6.  Status post cholecystectomy, appendectomy, and hysterectomy.   PROCEDURE:  On Jul 11, 2005, implantation of Medtronic VERSA dual-chamber  pacemaker with intraoperative venogram for sinus node dysfunction by Dr.  Sherryl Manges.   BRIEF HISTORY:  Ms. Boughner is a 76 year old female with a history of presyncope  which is correlated with pauses seen on an event monitor.  She has also been  shown to have short runs of SVT.  Her atenolol was discontinued, but  symptoms have not significantly improved.  She gives no history of frank  syncope.  She presents for pacemaker implantation.   HOSPITAL COURSE:  The patient presented electively to [facility] on  Jul 11, 2005.  She underwent successful implantation of Medtronic dual-  chamber pacemaker.  In the postoperative period, she did have chest pain,  central, going through to the back.  It was relieved with sublingual  nitroglycerin and IV  morphine.  Troponin I studies were obtained, and they  were as follows:  0.07 and 0.05.  An electrocardiogram showed no evidence of  acute myocardial infarction.  The patient has had no recurrence of chest  pain and had no prior cardiac history.  The patient also complains of fairly  significant pain at the pacemaker implantation site, although the site  itself has no hematoma and no ecchymoses.  Telemetry on the morning after  the implantation showed sinus rhythm with A pacing and V sensing at times.  The chest x-[place] shows no evidence of pneumothorax.  The leads are in  appropriate position.  The device has been interrogated, and all values are  within normal limits, and no changes were made.  The patient has been  counseled upon movement of the left arm for the next 4 days.  She is to keep  her arm quiet by her side until Saturday July 15, 2005.  She is asked to keep  her incision dry for the next 7 days, to sponge bathe  until Tuesday July 18, 2005.   DISCHARGE DIET:  Low-sodium, low-cholesterol diet.   She goes home on the following medications:  1.  Percocet 5/325, 1-2 tabs q.4-6 h. as needed for pain.  2.  Ambien 5 mg at bedtime for insomnia.  3.  Prilosec 20 mg daily.  4.  Multivitamin daily.  5.  Vytorin 10/40 daily at bedtime.  6.  Levoxyl 75 mcg daily.   1.  She has followup at [facility], [address],      [facility], Wednesday July 26, 2005 at 9:20.  2.  To see Dr. Graciela Husbands Tuesday October 31, 2005 at 9:00.   LABORATORY STUDIES:  On Jul 11, 2005, white cells 5.1, hemoglobin 13.2,  hematocrit 38.6, platelets 141.  The PT was 14.1, the INR 1.1.  Serum  electrolytes on Jul 11, 2005:  Sodium 144, potassium 3.6, chloride 110,  bicarbonate 28, glucose 85, BUN 15, creatinine 0.9.      Maple Mirza, P.A.    ______________________________  Duke Salvia, M.D.    GM/MEDQ  D:  07/12/2005  T:  07/12/2005  Job:  [id number]   cc:   Charles Bing, M.D. [facility]  [address]  [address]  [place]  [place] [address]   [facility]  [place]

## 2010-07-04 ENCOUNTER — Other Ambulatory Visit: Payer: Self-pay

## 2010-07-04 ENCOUNTER — Ambulatory Visit (INDEPENDENT_AMBULATORY_CARE_PROVIDER_SITE_OTHER): Payer: PRIVATE HEALTH INSURANCE | Admitting: *Deleted

## 2010-07-04 DIAGNOSIS — I495 Sick sinus syndrome: Secondary | ICD-10-CM

## 2010-08-05 ENCOUNTER — Encounter: Payer: Self-pay | Admitting: *Deleted

## 2010-08-18 NOTE — Progress Notes (Signed)
Pacer remote 

## 2010-08-24 ENCOUNTER — Ambulatory Visit (INDEPENDENT_AMBULATORY_CARE_PROVIDER_SITE_OTHER): Payer: PRIVATE HEALTH INSURANCE | Admitting: Adult Health

## 2010-08-24 ENCOUNTER — Encounter: Payer: Self-pay | Admitting: Adult Health

## 2010-08-24 DIAGNOSIS — R404 Transient alteration of awareness: Secondary | ICD-10-CM

## 2010-08-24 DIAGNOSIS — I10 Essential (primary) hypertension: Secondary | ICD-10-CM

## 2010-08-24 DIAGNOSIS — R609 Edema, unspecified: Secondary | ICD-10-CM

## 2010-08-24 DIAGNOSIS — E039 Hypothyroidism, unspecified: Secondary | ICD-10-CM

## 2010-08-24 DIAGNOSIS — R0602 Shortness of breath: Secondary | ICD-10-CM

## 2010-08-24 DIAGNOSIS — Z950 Presence of cardiac pacemaker: Secondary | ICD-10-CM

## 2010-08-24 MED ORDER — HYDROCHLOROTHIAZIDE 12.5 MG PO CAPS: 12.5000 mg | ORAL_CAPSULE | Freq: Every day | ORAL | Status: DC

## 2010-08-24 MED ORDER — HYDROCHLOROTHIAZIDE 12.5 MG PO CAPS
12.5000 mg | ORAL_CAPSULE | Freq: Every day | ORAL | Status: DC | PRN
Start: 2010-08-24 — End: 2010-10-04

## 2010-08-24 NOTE — Assessment & Plan Note (Signed)
I will add HCTZ 12.5mg  daily prn edema.  I will check an echocardiogram to evaluate for Lv fx.  BMET and BNP will be ordered as well for kidney fx.  I have advised her on a low salt diet.  We will get labs recently drawn about a month ago to check TSH level drawn.  Follow-up in one month.

## 2010-08-24 NOTE — Assessment & Plan Note (Signed)
Currently controlled at present.  Will recheck on next visit with addition of HCTZ.

## 2010-08-24 NOTE — Patient Instructions (Addendum)
Your physician has requested that you have an echocardiogram. Echocardiography is a painless test that uses sound waves to create images of your heart. It provides your doctor with information about the size and shape of your heart and how well your heart's chambers and valves are working. This procedure takes approximately one hour. There are no restrictions for this procedure.  Your physician recommends that you return for lab work in: today  Your physician has recommended you make the following change in your medication: start taking Hydrochlorothiazide 12.5mg  as needed for edema  Your physician recommends that you schedule a follow-up appointment in: 1 month

## 2010-08-24 NOTE — Progress Notes (Signed)
HPI:  Mrs. Henkes is a 76 y/o patient of Dr.s Lewayne Bunting and Sherwood Gambler who is here for complaints of edema. She has a history of of SSS w/p implantation of pacemaker on 07/11/2005, hypertension, hypothyroidism,. She has noticed fluid retention for over a month now, with trouble putting on her shoes, breathing when lying down flat, and abdominal distention with trouble buttoning skirts and pants..  She is normally very sedentary and works as a Comptroller for the elderly.  She admits to eating some salty foods, mostly cold cuts.  She states that the edema is causing her to have pain in her legs bilaterally, and is better in the am.She has gained 4 lbs since last visit.  Allergies no known allergies  Current Outpatient Prescriptions  Medication Sig Dispense Refill  . aspirin 81 MG tablet Take 81 mg by mouth daily.        Marland Kitchen atenolol (TENORMIN) 50 MG tablet Take 50 mg by mouth daily.        . Calcium Carbonate (CALTRATE 600) 1500 MG TABS Take 1,500 mg by mouth 1 dose over 46 hours.        Marland Kitchen dicyclomine (BENTYL) 10 MG capsule Take 10 mg by mouth 4 (four) times daily as needed.        . ezetimibe-simvastatin (VYTORIN) 10-40 MG per tablet Take 1 tablet by mouth at bedtime.        Marland Kitchen HYDROcodone-acetaminophen (VICODIN) 5-500 MG per tablet Take 1 tablet by mouth at bedtime as needed.       Marland Kitchen levothyroxine (SYNTHROID, LEVOTHROID) 75 MCG tablet Take 75 mcg by mouth daily.        Marland Kitchen omeprazole (PRILOSEC) 20 MG capsule Take 20 mg by mouth daily.          Past Medical History  Diagnosis Date  . GERD (gastroesophageal reflux disease)   . Dyslipidemia   . Hypertension   . Hypothyroidism   . IBS (irritable bowel syndrome)   . Diverticulitis     Past Surgical History  Procedure Date  . Cholecystectomy   . Appendectomy   . Colectomy     SECONDARY TO DIVERTICULITIS  . Ectopic pregnancy surgery   . Pacemaker placement     ZOX:WRUEAV of systems complete and found to be negative unless listed above PHYSICAL  EXAM BP 129/71  Pulse 59  Ht 5\' 5"  (1.651 m)  Wt 183 lb (83.008 kg)  BMI 30.45 kg/m2  SpO2 95% General: Well developed, well nourished, in no acute distress Head: Eyes PERRLA, No xanthomas.   Normal cephalic and atramatic  Lungs: Clear bilaterally to auscultation and percussion. Heart: HRRR S1 S2,r.  Pulses are 2+ & equal.            No carotid bruit. No JVD.  No abdominal bruits. No femoral bruits. Abdomen: Bowel sounds are positive, abdomen soft and non-tender without masses or                  Hernia's noted. Msk:  Back normal, normal gait. Normal strength and tone for age. Extremities: No clubbing, cyanosis  1+ edema bilaterally.  DP +1 Neuro: Alert and oriented X 3. Psych:  Good affect, responds appropriately    ASSESSMENT AND PLAN

## 2010-08-25 LAB — BASIC METABOLIC PANEL
Chloride: 106 mEq/L (ref 96–112)
Potassium: 3.8 mEq/L (ref 3.5–5.3)
Sodium: 142 mEq/L (ref 135–145)

## 2010-08-25 LAB — TSH: TSH: 1.369 u[IU]/mL (ref 0.350–4.500)

## 2010-08-25 LAB — BRAIN NATRIURETIC PEPTIDE: Brain Natriuretic Peptide: 47.1 pg/mL (ref 0.0–100.0)

## 2010-08-31 ENCOUNTER — Ambulatory Visit (HOSPITAL_COMMUNITY)
Admission: RE | Admit: 2010-08-31 | Discharge: 2010-08-31 | Disposition: A | Discharge status: Home / Self Care | Payer: Medicare Other | Source: Ambulatory Visit | Attending: Adult Health | Admitting: Adult Health

## 2010-08-31 DIAGNOSIS — R0609 Other forms of dyspnea: Secondary | ICD-10-CM | POA: Insufficient documentation

## 2010-08-31 DIAGNOSIS — R0989 Other specified symptoms and signs involving the circulatory and respiratory systems: Secondary | ICD-10-CM | POA: Insufficient documentation

## 2010-08-31 DIAGNOSIS — E785 Hyperlipidemia, unspecified: Secondary | ICD-10-CM | POA: Insufficient documentation

## 2010-08-31 DIAGNOSIS — R0602 Shortness of breath: Secondary | ICD-10-CM

## 2010-08-31 DIAGNOSIS — R609 Edema, unspecified: Secondary | ICD-10-CM | POA: Insufficient documentation

## 2010-08-31 DIAGNOSIS — I369 Nonrheumatic tricuspid valve disorder, unspecified: Secondary | ICD-10-CM

## 2010-08-31 NOTE — Progress Notes (Signed)
*  PRELIMINARY RESULTS* Echocardiogram 2D Echocardiogram has been performed.  Kari Hall 08/31/2010, 9:54 AM

## 2010-09-08 ENCOUNTER — Encounter: Payer: Self-pay | Admitting: Adult Health

## 2010-09-08 ENCOUNTER — Ambulatory Visit (INDEPENDENT_AMBULATORY_CARE_PROVIDER_SITE_OTHER): Payer: Medicare Other | Admitting: Adult Health

## 2010-09-08 DIAGNOSIS — I10 Essential (primary) hypertension: Secondary | ICD-10-CM

## 2010-09-08 DIAGNOSIS — Z950 Presence of cardiac pacemaker: Secondary | ICD-10-CM

## 2010-09-08 DIAGNOSIS — E039 Hypothyroidism, unspecified: Secondary | ICD-10-CM

## 2010-09-08 NOTE — Patient Instructions (Signed)
You will follow up with Kari Hall in 1 year. You will receive a reminder letter in the mail about 1-2 months in advance. If you don't receive this letter, please contact our office to schedule.  Continue your current medications. Refer to the medication list given to you today.

## 2010-09-08 NOTE — Assessment & Plan Note (Signed)
BP is currently controlled. She has no further complaints of edema with addition of HCTZ 12.5 mg daily.  She states her clothes are fitting better and she feels better in general. She is advised to avoid salty foods.   Follow-up one year.

## 2010-09-08 NOTE — Assessment & Plan Note (Signed)
She is followed by [facility] for this. Labs have been recently drawn, she states that she was told they were normal.

## 2010-09-08 NOTE — Assessment & Plan Note (Signed)
Continue follow-up appointments with pacemaker clinic. She has one scheduled in August.

## 2010-09-19 ENCOUNTER — Encounter: Payer: Self-pay | Admitting: Internal Medicine

## 2010-10-03 ENCOUNTER — Telehealth: Payer: Self-pay | Admitting: Adult Health

## 2010-10-03 NOTE — Telephone Encounter (Signed)
PT WANTS TO KNOW WHAT ELSE TO DO THAT THEY NEW MED [facility] IS NOT WORKING.

## 2010-10-04 MED ORDER — HYDROCHLOROTHIAZIDE 25 MG PO TABS: 25.0000 mg | ORAL_TABLET | Freq: Every day | ORAL | Status: DC

## 2010-10-04 NOTE — Telephone Encounter (Signed)
May increase HCTZ to 25mg  daily and prn. If not better, make appointment if not already completed.  Joni Reining NP

## 2010-10-06 ENCOUNTER — Other Ambulatory Visit: Payer: Self-pay | Admitting: Internal Medicine

## 2010-10-06 ENCOUNTER — Ambulatory Visit (INDEPENDENT_AMBULATORY_CARE_PROVIDER_SITE_OTHER): Payer: Medicare Other | Admitting: *Deleted

## 2010-10-06 DIAGNOSIS — I495 Sick sinus syndrome: Secondary | ICD-10-CM

## 2010-10-10 LAB — REMOTE PACEMAKER DEVICE
AL THRESHOLD: 0.5 V
BAMS-0001: 175 {beats}/min
RV LEAD AMPLITUDE: 22.4 mv
RV LEAD THRESHOLD: 0.875 V

## 2010-10-24 NOTE — Progress Notes (Signed)
Pacer checked remotely. 

## 2010-10-26 ENCOUNTER — Encounter: Payer: Self-pay | Admitting: *Deleted

## 2010-11-18 NOTE — Progress Notes (Signed)
No additional notes needed  

## 2011-01-12 ENCOUNTER — Encounter: Payer: Medicare Other | Admitting: *Deleted

## 2011-01-17 ENCOUNTER — Encounter: Payer: Self-pay | Admitting: *Deleted

## 2011-01-23 ENCOUNTER — Ambulatory Visit (INDEPENDENT_AMBULATORY_CARE_PROVIDER_SITE_OTHER): Payer: Medicare Other | Admitting: *Deleted

## 2011-01-23 ENCOUNTER — Encounter: Payer: Self-pay | Admitting: Internal Medicine

## 2011-01-23 ENCOUNTER — Other Ambulatory Visit: Payer: Self-pay | Admitting: Internal Medicine

## 2011-01-23 DIAGNOSIS — I495 Sick sinus syndrome: Secondary | ICD-10-CM

## 2011-01-24 ENCOUNTER — Other Ambulatory Visit (HOSPITAL_COMMUNITY): Payer: Self-pay | Admitting: Family Medicine

## 2011-01-24 DIAGNOSIS — Z139 Encounter for screening, unspecified: Secondary | ICD-10-CM

## 2011-01-26 ENCOUNTER — Ambulatory Visit (HOSPITAL_COMMUNITY)
Admission: RE | Admit: 2011-01-26 | Discharge: 2011-01-26 | Disposition: A | Discharge status: Home / Self Care | Payer: Medicare Other | Source: Ambulatory Visit | Attending: Family Medicine | Admitting: Family Medicine

## 2011-01-26 DIAGNOSIS — Z139 Encounter for screening, unspecified: Secondary | ICD-10-CM

## 2011-01-26 DIAGNOSIS — Z780 Asymptomatic menopausal state: Secondary | ICD-10-CM | POA: Insufficient documentation

## 2011-01-26 DIAGNOSIS — M818 Other osteoporosis without current pathological fracture: Secondary | ICD-10-CM | POA: Insufficient documentation

## 2011-01-26 LAB — REMOTE PACEMAKER DEVICE
AL AMPLITUDE: 2.8 mv
AL IMPEDENCE PM: 450 Ohm
AL THRESHOLD: 0.5 V
RV LEAD AMPLITUDE: 22.4 mv
RV LEAD IMPEDENCE PM: 597 Ohm

## 2011-01-31 NOTE — Progress Notes (Signed)
PPM remote 

## 2011-02-15 ENCOUNTER — Encounter: Payer: Self-pay | Admitting: *Deleted

## 2011-05-04 ENCOUNTER — Encounter: Payer: Self-pay | Admitting: Internal Medicine

## 2011-05-04 ENCOUNTER — Ambulatory Visit (INDEPENDENT_AMBULATORY_CARE_PROVIDER_SITE_OTHER): Payer: 59 | Admitting: Internal Medicine

## 2011-05-04 VITALS — BP 143/83 | HR 60 | Resp 18 | Ht 66.0 in | Wt 191.0 lb

## 2011-05-04 DIAGNOSIS — Z950 Presence of cardiac pacemaker: Secondary | ICD-10-CM

## 2011-05-04 DIAGNOSIS — I5032 Chronic diastolic (congestive) heart failure: Secondary | ICD-10-CM

## 2011-05-04 DIAGNOSIS — I10 Essential (primary) hypertension: Secondary | ICD-10-CM

## 2011-05-04 DIAGNOSIS — I509 Heart failure, unspecified: Secondary | ICD-10-CM

## 2011-05-04 DIAGNOSIS — I495 Sick sinus syndrome: Secondary | ICD-10-CM

## 2011-05-04 LAB — PACEMAKER DEVICE OBSERVATION
AL AMPLITUDE: 4 mv
BAMS-0001: 175 {beats}/min
BATTERY VOLTAGE: 2.76 V
RV LEAD IMPEDENCE PM: 549 Ohm
VENTRICULAR PACING PM: 21

## 2011-05-04 MED ORDER — FUROSEMIDE 40 MG PO TABS: 40.0000 mg | ORAL_TABLET | Freq: Every day | ORAL | Status: DC

## 2011-05-04 NOTE — Progress Notes (Signed)
HPI Mrs. Paradise returns today for followup. She is a pleasant 77 yo woman with symptomatic bradycardia, s/p PPM, HTN, and diastolic chf. She c/o peripheral edema in the p.m.and sob. She admits to dietary indiscretion with sodium. No syncope. No chest pain.  No Known Allergies   Current Outpatient Prescriptions  Medication Sig Dispense Refill  . aspirin 81 MG tablet Take 81 mg by mouth daily.        Marland Kitchen atenolol (TENORMIN) 50 MG tablet Take 50 mg by mouth daily.        Marland Kitchen atorvastatin (LIPITOR) 40 MG tablet Take 40 mg by mouth daily.      . Calcium Carbonate (CALTRATE 600) 1500 MG TABS Take 1,500 mg by mouth 1 dose over 46 hours.        Marland Kitchen dicyclomine (BENTYL) 10 MG capsule Take 10 mg by mouth 4 (four) times daily as needed.        Marland Kitchen HYDROcodone-acetaminophen (VICODIN) 5-500 MG per tablet Take 1 tablet by mouth every 6 (six) hours as needed.      Marland Kitchen levothyroxine (SYNTHROID, LEVOTHROID) 75 MCG tablet Take 75 mcg by mouth daily.        Marland Kitchen omeprazole (PRILOSEC) 20 MG capsule Take 20 mg by mouth daily.        Marland Kitchen POTASSIUM GLUCONATE PO Take by mouth daily.      . vitamin C (ASCORBIC ACID) 500 MG tablet Take 500 mg by mouth daily.      . furosemide (LASIX) 40 MG tablet Take 1 tablet (40 mg total) by mouth daily.  30 tablet  11     Past Medical History  Diagnosis Date  . GERD (gastroesophageal reflux disease)   . Dyslipidemia   . Hypertension   . Hypothyroidism   . IBS (irritable bowel syndrome)   . Diverticulitis   . Sinoatrial node dysfunction     ROS:   All systems reviewed and negative except as noted in the HPI.   Past Surgical History  Procedure Date  . Cholecystectomy   . Appendectomy   . Colectomy     SECONDARY TO DIVERTICULITIS  . Ectopic pregnancy surgery   . Pacemaker placement      No family history on file.   History   Social History  . Marital Status: Divorced    Spouse Name: N/A    Number of Children: N/A  . Years of Education: N/A   Occupational History  .  Not on file.   Social History Main Topics  . Smoking status: Former Smoker    Types: Cigarettes    Quit date: 02/24/1979  . Smokeless tobacco: Not on file  . Alcohol Use: Not on file  . Drug Use: Not on file  . Sexually Active: Not on file   Other Topics Concern  . Not on file   Social History Narrative  . No narrative on file     BP 143/83  Pulse 60  Resp 18  Ht 5\' 6"  (1.676 m)  Wt 86.637 kg (191 lb)  BMI 30.83 kg/m2  Physical Exam:  Well appearing 77 yo woman, NAD HEENT: Unremarkable Neck:  No JVD, no thyromegally Lungs:  Clear except for basilar rales. HEART:  Regular rate rhythm, no murmurs, no rubs, no clicks Abd:  soft, positive bowel sounds, no organomegally, no rebound, no guarding Ext:  2 plus pulses, trace edema, no cyanosis, no clubbing Skin:  No rashes no nodules Neuro:  CN II through XII intact, motor grossly intact  DEVICE  Normal device function.  See PaceArt for details.   Assess/Plan:

## 2011-05-04 NOTE — Assessment & Plan Note (Signed)
Her symptoms are worsened. We discussed the importance of maintaining a low sodium diet and I have asked the patient to start lasix 40 mg daily and stop HCTZ.

## 2011-05-04 NOTE — Patient Instructions (Signed)
**Note De-Identified  Obfuscation** Your physician recommends that you return for lab work in:BMP on 05-11-11  Your physician has recommended you make the following change in your medication: stop taking HCTZ and start taking Lasix 40 mg daily  Your physician recommends that you schedule a follow-up appointment in: 1 year

## 2011-05-04 NOTE — Assessment & Plan Note (Signed)
Her device is working normally. Will plan to recheck in several months. 

## 2011-05-05 LAB — BASIC METABOLIC PANEL
BUN: 20 mg/dL (ref 6–23)
Chloride: 103 mEq/L (ref 96–112)
Potassium: 3.6 mEq/L (ref 3.5–5.3)

## 2011-05-08 ENCOUNTER — Encounter: Payer: Self-pay | Admitting: Internal Medicine

## 2011-05-31 ENCOUNTER — Telehealth: Payer: Self-pay | Admitting: Internal Medicine

## 2011-05-31 NOTE — Telephone Encounter (Signed)
Patient states that she had to stop taking Furosemide because it made her weak.  States that her feet and legs are still swelling.  Wants to know what she should do. / tg

## 2011-05-31 NOTE — Telephone Encounter (Signed)
**Note De-Identified  Obfuscation** Pt. c/o fatigue and weakness since starting Furosemide 40 mg about 3 weeks ago ( at last OV with Dr. Ladona Ridgel on 3/21). She states that she has not taken Furosemide for about 3 days and the s/s stopped. Pt. encouraged to continue Furosemide but she refuses and wants to know what else she should do. Please advise./LV

## 2011-07-02 ENCOUNTER — Emergency Department (HOSPITAL_COMMUNITY)
Admission: EM | Admit: 2011-07-02 | Discharge: 2011-07-02 | Disposition: A | Discharge status: Home / Self Care | Payer: Medicare PPO | Attending: Emergency Medicine | Admitting: Emergency Medicine

## 2011-07-02 ENCOUNTER — Emergency Department (HOSPITAL_COMMUNITY): Payer: Medicare PPO

## 2011-07-02 ENCOUNTER — Encounter (HOSPITAL_COMMUNITY): Payer: Self-pay

## 2011-07-02 DIAGNOSIS — R0602 Shortness of breath: Secondary | ICD-10-CM | POA: Insufficient documentation

## 2011-07-02 DIAGNOSIS — R5383 Other fatigue: Secondary | ICD-10-CM

## 2011-07-02 DIAGNOSIS — R110 Nausea: Secondary | ICD-10-CM | POA: Insufficient documentation

## 2011-07-02 DIAGNOSIS — R42 Dizziness and giddiness: Secondary | ICD-10-CM | POA: Insufficient documentation

## 2011-07-02 DIAGNOSIS — I440 Atrioventricular block, first degree: Secondary | ICD-10-CM | POA: Insufficient documentation

## 2011-07-02 DIAGNOSIS — E785 Hyperlipidemia, unspecified: Secondary | ICD-10-CM | POA: Insufficient documentation

## 2011-07-02 DIAGNOSIS — K219 Gastro-esophageal reflux disease without esophagitis: Secondary | ICD-10-CM | POA: Insufficient documentation

## 2011-07-02 DIAGNOSIS — Z87891 Personal history of nicotine dependence: Secondary | ICD-10-CM | POA: Insufficient documentation

## 2011-07-02 DIAGNOSIS — R5381 Other malaise: Secondary | ICD-10-CM | POA: Insufficient documentation

## 2011-07-02 DIAGNOSIS — I10 Essential (primary) hypertension: Secondary | ICD-10-CM | POA: Insufficient documentation

## 2011-07-02 DIAGNOSIS — Z950 Presence of cardiac pacemaker: Secondary | ICD-10-CM | POA: Insufficient documentation

## 2011-07-02 DIAGNOSIS — E876 Hypokalemia: Secondary | ICD-10-CM

## 2011-07-02 DIAGNOSIS — E039 Hypothyroidism, unspecified: Secondary | ICD-10-CM | POA: Insufficient documentation

## 2011-07-02 LAB — URINALYSIS, ROUTINE W REFLEX MICROSCOPIC
Bilirubin Urine: NEGATIVE
Glucose, UA: NEGATIVE mg/dL
Hgb urine dipstick: NEGATIVE
Ketones, ur: NEGATIVE mg/dL
Leukocytes, UA: NEGATIVE
Specific Gravity, Urine: 1.01 (ref 1.005–1.030)
pH: 6.5 (ref 5.0–8.0)

## 2011-07-02 LAB — BASIC METABOLIC PANEL
BUN: 19 mg/dL (ref 6–23)
CO2: 28 mEq/L (ref 19–32)
Calcium: 8.6 mg/dL (ref 8.4–10.5)
Chloride: 103 mEq/L (ref 96–112)
Creatinine, Ser: 0.97 mg/dL (ref 0.50–1.10)
Glucose, Bld: 122 mg/dL — ABNORMAL HIGH (ref 70–99)

## 2011-07-02 LAB — CBC
MCH: 31.7 pg (ref 26.0–34.0)
MCV: 91 fL (ref 78.0–100.0)
Platelets: 129 10*3/uL — ABNORMAL LOW (ref 150–400)
RBC: 4.23 MIL/uL (ref 3.87–5.11)

## 2011-07-02 LAB — HEPATIC FUNCTION PANEL
ALT: 13 U/L (ref 0–35)
Bilirubin, Direct: 0.1 mg/dL (ref 0.0–0.3)
Indirect Bilirubin: 0.4 mg/dL (ref 0.3–0.9)

## 2011-07-02 LAB — DIFFERENTIAL
Basophils Absolute: 0 10*3/uL (ref 0.0–0.1)
Eosinophils Relative: 3 % (ref 0–5)
Lymphocytes Relative: 23 % (ref 12–46)
Monocytes Absolute: 0.8 10*3/uL (ref 0.1–1.0)
Monocytes Relative: 12 % (ref 3–12)
Neutro Abs: 4 10*3/uL (ref 1.7–7.7)

## 2011-07-02 LAB — TROPONIN I: Troponin I: <0.3 ng/mL (ref <0.30)

## 2011-07-02 MED ORDER — POTASSIUM CHLORIDE 20 MEQ/15ML (10%) PO LIQD
40.0000 meq | Freq: Once | ORAL | Status: AC
  Administered 2011-07-02: 40 meq via ORAL
  Filled 2011-07-02 (×2): qty 30

## 2011-07-02 MED ORDER — POTASSIUM CHLORIDE 10 MEQ/100ML IV SOLN
10.0000 meq | Freq: Once | INTRAVENOUS | Status: AC
  Administered 2011-07-02: 10 meq via INTRAVENOUS
  Filled 2011-07-02: qty 100

## 2011-07-02 NOTE — ED Notes (Signed)
Decreased pt potassium drip to 30 ml, c/o pain during infusion

## 2011-07-02 NOTE — Discharge Instructions (Signed)
RESOURCE GUIDE  Dental Problems  Patients with Medicaid: [facility]                     [facility] [address]                                           [address] Phone:  [phone]                                                  Phone:  [phone]  If unable to pay or uninsured, contact:  [facility] or [facility] to become qualified for the adult dental clinic.  Chronic Pain Problems Contact [facility]  [phone] Patients need to be referred by their primary care doctor.  Insufficient Money for Medicine Contact [employer]:  call "211" or [facility] 271-5999.  No Primary Care Doctor Call [facility]  832-8000 Other agencies that provide inexpensive medical care    [facility]  [phone]    [facility]  [phone]    [facility]  [phone]    [facility]  [phone]    [facility]  [phone]    [facility]  [phone]  Psychological Services [facility]  [phone] [facility]  [phone] [facility]   [phone] (emergency services [phone])  Substance Abuse Resources [facility]  336-882-2125 [facility] 336-784-9470 [facility] 336-285-9073 [facility] 336-845-3988 Residential & Outpatient Substance Abuse Program  800-659-3381  Abuse/Neglect [place] Child Abuse Hotline (336) 641-3795 [place] [facility] Abuse Hotline 800-378-5315 (After Hours)  Emergency Shelter  [facility] (336) 271-5985  [facility] (336) 275-9566 [facility] (704) 372-4663  MRSA Hotline #:   832-7006    [place] [facility]  [facility] [facility]     [employer]                          [facility] [address] [place]                       [address]      [address]  [place]                                                [place]                            [place] Phone:  [phone]                                   Phone:  [phone]                 Phone:  [phone]  [facility] Phone:  [phone]    [place] Child Abuse Hotline (336) 342-1394 (336) 342-3537 (After Hours)    Take your usual prescriptions as previously directed.  Call your regular medical doctor tomorrow morning to schedule a follow up appointment this week.  Return to the Emergency Department immediately sooner if worsening.   

## 2011-07-02 NOTE — ED Notes (Signed)
Pt reports that she was started on "fluid pill" about 3 months ago and has not been feeling like herself since,  More tired than normal, has gained approx 30 pounds in weight, sob at times and dizzy at times. Denies any cp, or cough. sats on room air 90% at arrival.  Placed on o2 and increased to 94%.

## 2011-07-02 NOTE — ED Notes (Signed)
Dr.mcmanus to see pt, pt denies complaints at this time

## 2011-07-02 NOTE — ED Notes (Signed)
Patient walked around E.R. - denied any sob or dizziness.  O2 sat maintained at 96-97%.

## 2011-07-02 NOTE — ED Notes (Signed)
Pt stated that she has not been feeling well--weak and dizzy off/on for 3 months, all symptoms started after starting new "fluid pill", sob at times. Has not followed up w/ pmd. Denies any cp, no cough or fever, no vomiting.  "I stay thirsty"

## 2011-07-02 NOTE — ED Provider Notes (Signed)
History     CSN: [id number]  Arrival date & time 07/02/11  1127   First MD Initiated Contact with Patient 07/02/11 1311      Chief Complaint  Patient presents with  . Fatigue    HPI Pt was seen at 1315.  Per pt, c/o gradual onset and persistence of constant generalized weakness, nausea, and lightheadedness for the past 3-4 months.  States she has not seen her PMD or her Cardiologist for same.  Denies any change in her symptoms for the past 3 to 4 months.  Denies CP/palpitations, no SOB/cough, no abd pain, no vomiting/diarrhea, no back pain, no visual changes, no focal motor weakness, no tingling/numbness in extremities, no fevers, no rash.     Past Medical History  Diagnosis Date  . GERD (gastroesophageal reflux disease)   . Dyslipidemia   . Hypertension   . Hypothyroidism   . IBS (irritable bowel syndrome)   . Diverticulitis   . Sinoatrial node dysfunction     Past Surgical History  Procedure Date  . Cholecystectomy   . Appendectomy   . Colectomy     SECONDARY TO DIVERTICULITIS  . Ectopic pregnancy surgery   . Pacemaker placement     History  Substance Use Topics  . Smoking status: Former Smoker    Types: Cigarettes    Quit date: 02/24/1979  . Smokeless tobacco: Not on file  . Alcohol Use: No     Review of Systems ROS: Statement: All systems negative except as marked or noted in the HPI; Constitutional: Negative for fever and chills. ; ; Eyes: Negative for eye pain, redness and discharge. ; ; ENMT: Negative for ear pain, hoarseness, nasal congestion, sinus pressure and sore throat. ; ; Cardiovascular: Negative for chest pain, palpitations, diaphoresis, dyspnea and peripheral edema. ; ; Respiratory: Negative for cough, wheezing and stridor. ; ; Gastrointestinal: +nausea. Negative for vomiting, diarrhea, abdominal pain, blood in stool, hematemesis, jaundice and rectal bleeding. . ; ; Genitourinary: Negative for dysuria, flank pain and hematuria. ; ; Musculoskeletal:  Negative for back pain and neck pain. Negative for swelling and trauma.; ; Skin: Negative for pruritus, rash, abrasions, blisters, bruising and skin lesion.; ; Neuro: +weakness, lightheadedness. Negative for headache and neck stiffness. Negative for altered level of consciousness , altered mental status, extremity weakness, paresthesias, involuntary movement, seizure and syncope.      Allergies  Review of patient's allergies indicates no known allergies.  Home Medications   Current Outpatient Rx  Name Route Sig Dispense Refill  . ASPIRIN 81 MG PO TABS Oral Take 81 mg by mouth daily.      . ATENOLOL 50 MG PO TABS Oral Take 50 mg by mouth daily.      . ATORVASTATIN CALCIUM 40 MG PO TABS Oral Take 40 mg by mouth daily.    Marland Kitchen CALCIUM CARBONATE 1500 MG PO TABS Oral Take 1,500 mg by mouth daily.     Marland Kitchen DICYCLOMINE HCL 10 MG PO CAPS Oral Take 10 mg by mouth 4 (four) times daily as needed. IBS    . FUROSEMIDE 40 MG PO TABS Oral Take 1 tablet (40 mg total) by mouth daily. 30 tablet 11    To replace HCTZ  . HYDROCODONE-ACETAMINOPHEN 5-500 MG PO TABS Oral Take 1 tablet by mouth every 6 (six) hours as needed. Pain    . LEVOTHYROXINE SODIUM 75 MCG PO TABS Oral Take 75 mcg by mouth daily.      Marland Kitchen OMEPRAZOLE 20 MG PO CPDR Oral  Take 20 mg by mouth daily.      Marland Kitchen POTASSIUM GLUCONATE PO Oral Take 1 tablet by mouth daily.     Marland Kitchen VITAMIN C 500 MG PO TABS Oral Take 500 mg by mouth daily.      BP 115/81  Pulse 69  Temp(Src) 98.2 F (36.8 C) (Oral)  Resp 20  Ht 5\' 4"  (1.626 m)  Wt 180 lb (81.647 kg)  BMI 30.90 kg/m2  SpO2 95%  Physical Exam 1320: Physical examination:  Nursing notes reviewed; Vital signs and O2 SAT reviewed;  Constitutional: Well developed, Well nourished, Well hydrated, In no acute distress; Head:  Normocephalic, atraumatic; Eyes: EOMI, PERRL, No scleral icterus; ENMT: TM's clear bilat. Mouth and pharynx normal, Mucous membranes moist; Neck: Supple, Full range of motion, No  lymphadenopathy; Cardiovascular: Regular rate and rhythm, No murmur or gallop; Respiratory: Breath sounds clear & equal bilaterally, No rales, rhonchi, wheezes, speaking full sentences with ease.  Normal respiratory effort/excursion; Chest: Nontender, Movement normal; Abdomen: Soft, Nontender, Nondistended, Normal bowel sounds; Genitourinary: No CVA tenderness; Extremities: Pulses normal, No tenderness, No edema, No calf edema or asymmetry.; Neuro: AA&Ox3, Major CN grossly intact.  Strength 5/5 equal bilat UE's and LE's.  DTR 2/4 equal bilat UE's and LE's.  No gross sensory deficits.  Normal cerebellar testing bilat UE's and LE's. Speech clear.  No facial droop.  No nystagmus..; Skin: Color normal, Warm, Dry, no rash.   ED Course  Procedures    MDM  MDM Reviewed: nursing note, vitals and previous chart Reviewed previous: ECG and labs Interpretation: ECG, labs, x-Taul and CT scan    Date: 07/02/2011  Rate: 61  Rhythm: normal sinus rhythm  QRS Axis: normal  Intervals: PR prolonged  ST/T Wave abnormalities: normal  Conduction Disutrbances:first-degree A-V block   Narrative Interpretation:   Old EKG Reviewed: changes noted; previous EKG was electronic pacemaker.   Results for orders placed during the hospital encounter of 07/02/11  CBC      Component Value Range   WBC 6.4  4.0 - 10.5 (K/uL)   RBC 4.23  3.87 - 5.11 (MIL/uL)   Hemoglobin 13.4  12.0 - 15.0 (g/dL)   HCT 16.1  09.6 - 04.5 (%)   MCV 91.0  78.0 - 100.0 (fL)   MCH 31.7  26.0 - 34.0 (pg)   MCHC 34.8  30.0 - 36.0 (g/dL)   RDW 40.9  81.1 - 91.4 (%)   Platelets 129 (*) 150 - 400 (K/uL)  DIFFERENTIAL      Component Value Range   Neutrophils Relative 62  43 - 77 (%)   Neutro Abs 4.0  1.7 - 7.7 (K/uL)   Lymphocytes Relative 23  12 - 46 (%)   Lymphs Abs 1.5  0.7 - 4.0 (K/uL)   Monocytes Relative 12  3 - 12 (%)   Monocytes Absolute 0.8  0.1 - 1.0 (K/uL)   Eosinophils Relative 3  0 - 5 (%)   Eosinophils Absolute 0.2  0.0 - 0.7  (K/uL)   Basophils Relative 1  0 - 1 (%)   Basophils Absolute 0.0  0.0 - 0.1 (K/uL)  BASIC METABOLIC PANEL      Component Value Range   Sodium 143  135 - 145 (mEq/L)   Potassium 2.8 (*) 3.5 - 5.1 (mEq/L)   Chloride 103  96 - 112 (mEq/L)   CO2 28  19 - 32 (mEq/L)   Glucose, Bld 122 (*) 70 - 99 (mg/dL)   BUN 19  6 -  23 (mg/dL)   Creatinine, Ser 1.61  0.50 - 1.10 (mg/dL)   Calcium 8.6  8.4 - 09.6 (mg/dL)   GFR calc non Af Amer 56 (*) >90 (mL/min)   GFR calc Af Amer 65 (*) >90 (mL/min)  PRO B NATRIURETIC PEPTIDE      Component Value Range   Pro B Natriuretic peptide (BNP) 239.3  0 - 450 (pg/mL)  URINALYSIS, ROUTINE W REFLEX MICROSCOPIC      Component Value Range   Color, Urine STRAW (*) YELLOW    APPearance CLEAR  CLEAR    Specific Gravity, Urine 1.010  1.005 - 1.030    pH 6.5  5.0 - 8.0    Glucose, UA NEGATIVE  NEGATIVE (mg/dL)   Hgb urine dipstick NEGATIVE  NEGATIVE    Bilirubin Urine NEGATIVE  NEGATIVE    Ketones, ur NEGATIVE  NEGATIVE (mg/dL)   Protein, ur NEGATIVE  NEGATIVE (mg/dL)   Urobilinogen, UA 0.2  0.0 - 1.0 (mg/dL)   Nitrite NEGATIVE  NEGATIVE    Leukocytes, UA NEGATIVE  NEGATIVE   TROPONIN I      Component Value Range   Troponin I <0.30  <0.30 (ng/mL)  HEPATIC FUNCTION PANEL      Component Value Range   Total Protein 7.3  6.0 - 8.3 (g/dL)   Albumin 3.8  3.5 - 5.2 (g/dL)   AST 17  0 - 37 (U/L)   ALT 13  0 - 35 (U/L)   Alkaline Phosphatase 91  39 - 117 (U/L)   Total Bilirubin 0.5  0.3 - 1.2 (mg/dL)   Bilirubin, Direct 0.1  0.0 - 0.3 (mg/dL)   Indirect Bilirubin 0.4  0.3 - 0.9 (mg/dL)  LIPASE, BLOOD      Component Value Range   Lipase 54  11 - 59 (U/L)   Dg Chest 2 View 07/02/2011  *RADIOLOGY REPORT*  Clinical Data: Shortness of breath, weakness and loss of appetite.  CHEST - 2 VIEW  Comparison: 08/26/2009 and 10/19/2006.  Findings: Cardiomegaly and dual lead left-sided pacemaker again identified. Probable COPD/emphysema noted. There is no evidence of focal  airspace disease, pulmonary edema, suspicious pulmonary nodule/mass, pleural effusion, or pneumothorax. No acute bony abnormalities are identified. A mid thoracic compression fracture is again noted.  IMPRESSION: No evidence of acute cardiopulmonary disease.  Cardiomegaly and probable COPD/emphysema.  Original Report Authenticated By: Rosendo Gros, M.D.   Ct Head Wo Contrast 07/02/2011  *RADIOLOGY REPORT*  Clinical Data: Fatigue and weakness  CT HEAD WITHOUT CONTRAST  Technique:  Contiguous axial images were obtained from the base of the skull through the vertex without contrast.  Comparison: None.  Findings: Generalized atrophy which is age appropriate. Hypodensity throughout the cerebral white matter compatible with chronic microvascular ischemia.  Negative for acute infarct. Negative for hemorrhage or mass.  Calvarium is normal.  Paranasal sinuses are clear.  IMPRESSION: Atrophy and chronic microvascular ischemia in the white matter.  No acute abnormality.  Original Report Authenticated By: Camelia Phenes, M.D.   Results for LORIJEAN, HUSSER (MRN [id number]) as of 07/02/2011 15:29  Ref. Range 10/04/2008 10:25 03/17/2010 23:13 07/02/2011 11:50  Platelets Latest Range: 150-400 K/uL 107 (L) 130 (L) 129 (L)      3:27 PM:  Pt not orthostatic.  Has been ambulatory in the ED with steady gait, easy resps, Sats remaining 97% R/A.  Platelets per her baseline.  Potassium repleted PO and IV; already has rx for potassium to take daily.  Has tol PO well while  in ED without N/V.  Wants to go home now.  Dx testing d/w pt.  Questions answered.  Verb understanding, agreeable to d/c home with outpt f/u.           Laray Anger, DO 07/03/11 1759

## 2011-07-21 ENCOUNTER — Other Ambulatory Visit (HOSPITAL_COMMUNITY): Payer: Self-pay | Admitting: Internal Medicine

## 2011-07-21 DIAGNOSIS — Z139 Encounter for screening, unspecified: Secondary | ICD-10-CM

## 2011-07-27 ENCOUNTER — Ambulatory Visit (HOSPITAL_COMMUNITY): Payer: Medicare PPO

## 2011-08-03 ENCOUNTER — Encounter: Payer: Self-pay | Admitting: Internal Medicine

## 2011-08-03 ENCOUNTER — Ambulatory Visit (INDEPENDENT_AMBULATORY_CARE_PROVIDER_SITE_OTHER): Payer: Medicare PPO | Admitting: *Deleted

## 2011-08-03 DIAGNOSIS — Z950 Presence of cardiac pacemaker: Secondary | ICD-10-CM

## 2011-08-03 DIAGNOSIS — I495 Sick sinus syndrome: Secondary | ICD-10-CM

## 2011-08-14 LAB — REMOTE PACEMAKER DEVICE
AL AMPLITUDE: 2.8 mv
BAMS-0001: 175 {beats}/min
BATTERY VOLTAGE: 2.76 V
RV LEAD AMPLITUDE: 22.4 mv

## 2011-09-08 ENCOUNTER — Encounter: Payer: Self-pay | Admitting: *Deleted

## 2011-09-13 ENCOUNTER — Encounter: Payer: Self-pay | Admitting: Physician Assistant

## 2011-09-13 ENCOUNTER — Ambulatory Visit (INDEPENDENT_AMBULATORY_CARE_PROVIDER_SITE_OTHER): Payer: Medicare PPO | Admitting: Physician Assistant

## 2011-09-13 VITALS — BP 142/86 | HR 96 | Resp 16 | Ht 64.0 in | Wt 187.0 lb

## 2011-09-13 DIAGNOSIS — I509 Heart failure, unspecified: Secondary | ICD-10-CM

## 2011-09-13 DIAGNOSIS — R609 Edema, unspecified: Secondary | ICD-10-CM

## 2011-09-13 DIAGNOSIS — I10 Essential (primary) hypertension: Secondary | ICD-10-CM

## 2011-09-13 DIAGNOSIS — I5032 Chronic diastolic (congestive) heart failure: Secondary | ICD-10-CM

## 2011-09-13 NOTE — Assessment & Plan Note (Signed)
No evidence of heart failure on exam today. 

## 2011-09-13 NOTE — Patient Instructions (Addendum)
Follow a 2 gm sodium diet as you were given today  Check your blood pressure every day, write it down and bring it to your next appt  Your physician recommends that you schedule a follow-up appointment in: 3-4 weeks with Wynell Balloon

## 2011-09-13 NOTE — Assessment & Plan Note (Signed)
Patient has no evidence of edema today. I believe it is probably due to her dietary indiscretion. I've asked her to follow low-sodium diet.

## 2011-09-13 NOTE — Progress Notes (Signed)
HPI:  This is a pleasant 77 year old white female patient who has history of symptomatic bradycardia status post permanent pacemaker, hypertension, and diastolic heart failure. She had a 2-D echo in July 2012 that showed normal LV function with LVH.  The patient states that she had an emergency room visit in May when she became extremely weak and had a lot of swelling. Her potassium was low and was replaced. She thinks  She was taking another medication and this was stopped, but she can't recall the name. The only thing I can find was Vytorin was stopped. She does admit to eating quite a bit of salt. She had [employer] french fries yesterday and frequently eats baloney and high salt foods.  The patient states she still has swelling in her ankles off-and-on. She also has a  tingling in her toes and feet which she has talked to Dr. Sherwood Gambler about.     No Known Allergies  Current Outpatient Prescriptions on File Prior to Visit: aspirin 81 MG tablet, Take 81 mg by mouth daily.  , Disp: , Rfl:  atenolol (TENORMIN) 50 MG tablet, Take 50 mg by mouth daily.  , Disp: , Rfl:  dicyclomine (BENTYL) 10 MG capsule, Take 10 mg by mouth 4 (four) times daily as needed. IBS, Disp: , Rfl:  HYDROcodone-acetaminophen (VICODIN) 5-500 MG per tablet, Take 1 tablet by mouth every 6 (six) hours as needed. Pain, Disp: , Rfl:  levothyroxine (SYNTHROID, LEVOTHROID) 75 MCG tablet, Take 75 mcg by mouth daily.  , Disp: , Rfl:  omeprazole (PRILOSEC) 20 MG capsule, Take 20 mg by mouth daily.  , Disp: , Rfl:  POTASSIUM GLUCONATE PO, Take 1 tablet by mouth daily. , Disp: , Rfl:  atorvastatin (LIPITOR) 40 MG tablet, Take 40 mg by mouth daily., Disp: , Rfl:  Calcium Carbonate (CALTRATE 600) 1500 MG TABS, Take 1,500 mg by mouth daily. , Disp: , Rfl:  furosemide (LASIX) 40 MG tablet, Take 1 tablet (40 mg total) by mouth daily., Disp: 30 tablet, Rfl: 11 vitamin C (ASCORBIC ACID) 500 MG tablet, Take 500 mg by mouth daily., Disp: , Rfl:    DISCONTD: ezetimibe-simvastatin (VYTORIN) 10-40 MG per tablet, Take 1 tablet by mouth at bedtime.  , Disp: , Rfl:     Past Medical History:   GERD (gastroesophageal reflux disease)                       Dyslipidemia                                                 Hypertension                                                 Hypothyroidism                                               IBS (irritable bowel syndrome)                               Diverticulitis  Sinoatrial node dysfunction                                 Past Surgical History:   CHOLECYSTECTOMY                                              APPENDECTOMY                                                 COLECTOMY                                                      Comment:SECONDARY TO DIVERTICULITIS   ECTOPIC PREGNANCY SURGERY                                    PACEMAKER PLACEMENT                                         No family history on file.   Social History   Marital Status: Divorced            Spouse Name:                      Years of Education:                 Number of children:             Occupational History   None on file  Social History Main Topics   Smoking Status: Former Smoker                   Packs/Day:       Years:           Types: Cigarettes     Quit date: 02/24/1979   Smokeless Status: Not on file                      Alcohol Use: No             Drug Use: No             Sexual Activity: No                 Other Topics            Concern   None on file  Social History Narrative   None on file    ROS:see history of present illness otherwise negative   PHYSICAL EXAM: Well-nournished, in no acute distress. Neck: No JVD, HJR, Bruit, or thyroid enlargement  Lungs: No tachypnea, clear without wheezing, rales, or rhonchi  Cardiovascular: RRR, PMI not displaced,positive S4-1 1/6 systolic ejection murmur at the left border, no bruit, thrill,  or heave.  Abdomen: BS normal. Soft without organomegaly, masses, lesions or tenderness.  Extremities: without cyanosis, clubbing or edema.  Good distal pulses bilateral  SKin: Warm, no lesions or rashes   Musculoskeletal: No deformities  Neuro: no focal signs  BP 142/86  Pulse 96  Resp 16  Ht 5\' 4"  (1.626 m)  Wt 187 lb 0.6 oz (84.841 kg)  BMI 32.11 kg/m2   AVW:UJWJX checked in March of this year, no EKG performed today  2Decho:08/31/10 Study Conclusions  - Left ventricle: The cavity size was normal. Mild tomoderate LVH.   Systolic function was vigorous. The estimated ejection fraction   was in the range of 65% to 70%. Wall motion was normal; there were   no regional wall motion abnormalities. - Aortic valve: Mildly calcified annulus. Trileaflet; mildly   calcified leaflets. - Mitral valve: Calcified annulus. - Right ventricle: The cavity size was normal. Wall thickness was   moderately increased. - Atrial septum: No defect or patent foramen ovale was identified.

## 2011-09-13 NOTE — Assessment & Plan Note (Signed)
Patient's blood pressures on the high side today. She's never had this trouble before. I've asked her to cut back on her sodium intake. She will start to keep a daily log of her blood pressures at home and bring them with her at her next visit. I will see her back for blood pressure followup in 3-4 weeks.

## 2011-10-03 ENCOUNTER — Ambulatory Visit (HOSPITAL_COMMUNITY)
Admission: RE | Admit: 2011-10-03 | Discharge: 2011-10-03 | Disposition: A | Discharge status: Home / Self Care | Payer: Medicare PPO | Source: Ambulatory Visit | Attending: Internal Medicine | Admitting: Internal Medicine

## 2011-10-03 DIAGNOSIS — Z1231 Encounter for screening mammogram for malignant neoplasm of breast: Secondary | ICD-10-CM | POA: Insufficient documentation

## 2011-10-03 DIAGNOSIS — R922 Inconclusive mammogram: Secondary | ICD-10-CM | POA: Insufficient documentation

## 2011-10-03 DIAGNOSIS — Z139 Encounter for screening, unspecified: Secondary | ICD-10-CM

## 2011-10-05 ENCOUNTER — Other Ambulatory Visit: Payer: Self-pay | Admitting: Internal Medicine

## 2011-10-05 DIAGNOSIS — R928 Other abnormal and inconclusive findings on diagnostic imaging of breast: Secondary | ICD-10-CM

## 2011-10-09 ENCOUNTER — Encounter: Payer: Self-pay | Admitting: Adult Health

## 2011-10-09 ENCOUNTER — Ambulatory Visit (INDEPENDENT_AMBULATORY_CARE_PROVIDER_SITE_OTHER): Payer: Medicare PPO | Admitting: Adult Health

## 2011-10-09 VITALS — BP 130/82 | HR 72 | Ht 64.0 in | Wt 191.0 lb

## 2011-10-09 DIAGNOSIS — E785 Hyperlipidemia, unspecified: Secondary | ICD-10-CM

## 2011-10-09 DIAGNOSIS — I10 Essential (primary) hypertension: Secondary | ICD-10-CM

## 2011-10-09 DIAGNOSIS — Z950 Presence of cardiac pacemaker: Secondary | ICD-10-CM

## 2011-10-09 NOTE — Assessment & Plan Note (Signed)
She offers no complaints with her pacemaker. She is due to followup with Dr. Graciela Husbands in usual visits for interrogation.

## 2011-10-09 NOTE — Progress Notes (Signed)
HPI: Kari Hall is a 77 year old patient of Dr. Charlotte Bing we are seeing for ongoing assessment treatment of symptomatic bradycardia status post Medtronic Versa dual-chamber pacemaker on 07/11/2005, hypertension, and diastolic CHF. The patient was last seen by Herma Carson, PA, secondary to lower shin edema, and moderate hypertension. On that visit she was given instructions on low-salt diet, and asked to take her blood pressure daily and record. Since that visit she has significantly decreased her salt intake, is actually spoken with the nutritionist, and has been compliant concerning blood pressure checks; she brings recordings with her on this visit. She is without complaint of lower extremity edema, shortness of breath, or chest pressure.  No Known Allergies  Current Outpatient Prescriptions  Medication Sig Dispense Refill  . aspirin 81 MG tablet Take 81 mg by mouth daily.        Marland Kitchen atenolol (TENORMIN) 50 MG tablet Take 50 mg by mouth daily.        . Calcium Carbonate (CALTRATE 600) 1500 MG TABS Take 1,500 mg by mouth daily.       Marland Kitchen dicyclomine (BENTYL) 10 MG capsule Take 10 mg by mouth 4 (four) times daily as needed. IBS      . HYDROcodone-acetaminophen (VICODIN) 5-500 MG per tablet Take 1 tablet by mouth every 6 (six) hours as needed. Pain      . ibuprofen (ADVIL,MOTRIN) 200 MG tablet Take 200 mg by mouth as needed.      Marland Kitchen levothyroxine (SYNTHROID, LEVOTHROID) 75 MCG tablet Take 75 mcg by mouth daily.        Marland Kitchen omeprazole (PRILOSEC) 20 MG capsule Take 20 mg by mouth daily.        Marland Kitchen POTASSIUM GLUCONATE PO Take 1 tablet by mouth daily.       . vitamin C (ASCORBIC ACID) 500 MG tablet Take 500 mg by mouth daily.      . furosemide (LASIX) 40 MG tablet Take 1 tablet (40 mg total) by mouth daily.  30 tablet  11  . DISCONTD: ezetimibe-simvastatin (VYTORIN) 10-40 MG per tablet Take 1 tablet by mouth at bedtime.          Past Medical History  Diagnosis Date  . GERD (gastroesophageal reflux  disease)   . Dyslipidemia   . Hypertension   . Hypothyroidism   . IBS (irritable bowel syndrome)   . Diverticulitis   . Sinoatrial node dysfunction     Past Surgical History  Procedure Date  . Cholecystectomy   . Appendectomy   . Colectomy     SECONDARY TO DIVERTICULITIS  . Ectopic pregnancy surgery   . Pacemaker placement     WUX:LKGMWN of systems complete and found to be negative unless listed above  PHYSICAL EXAM BP 130/82  Pulse 72  Ht 5\' 4"  (1.626 m)  Wt 191 lb (86.637 kg)  BMI 32.79 kg/m2  General: Well developed, well nourished, in no acute distress Head: Eyes PERRLA, No xanthomas.   Normal cephalic and atramatic  Lungs: Clear bilaterally to auscultation and percussion. Heart: HRRR S1 S2, without MRG.  Pulses are 2+ & equal.            No carotid bruit. No JVD.  No abdominal bruits. No femoral bruits. Abdomen: Bowel sounds are positive, abdomen soft and non-tender without masses or                  Hernia's noted. Msk:  Back normal, normal gait. Normal strength and tone for age. Extremities:  No clubbing, cyanosis or edema.  DP +1 Neuro: Alert and oriented X 3. Psych:  Good affect, responds appropriately    ASSESSMENT AND PLAN

## 2011-10-09 NOTE — Patient Instructions (Addendum)
Your physician wants you to follow-up in: 6 months with Kathryn Lawrence, NP. You will receive a reminder letter in the mail two months in advance. If you don't receive a letter, please call our office to schedule the follow-up appointment.  

## 2011-10-09 NOTE — Assessment & Plan Note (Signed)
Review of blood pressure recordings shows excellent control. Blood pressure running 120 -130 systolic. She is avoiding salt. Reading labels, and not adding extra salt to her food. She is getting use to a low-salt diet and on occasion when she does have something salt he is unable to tolerate it as it becomes too salty to eat. I am pleased that she has been following her medical regimen and has better blood pressure control. I will make no changes. She is encouraged to continue this behavior.

## 2011-10-09 NOTE — Assessment & Plan Note (Signed)
She is followed by Dr. Sherwood Gambler for ongoing labs to include cholesterol studies, and thyroid levels. She states that she has an appointment with him within the next month.

## 2011-10-11 ENCOUNTER — Ambulatory Visit (HOSPITAL_COMMUNITY)
Admission: RE | Admit: 2011-10-11 | Discharge: 2011-10-11 | Disposition: A | Discharge status: Home / Self Care | Payer: Medicare PPO | Source: Ambulatory Visit | Attending: Internal Medicine | Admitting: Internal Medicine

## 2011-10-11 ENCOUNTER — Other Ambulatory Visit: Payer: Self-pay | Admitting: Internal Medicine

## 2011-10-11 DIAGNOSIS — R928 Other abnormal and inconclusive findings on diagnostic imaging of breast: Secondary | ICD-10-CM

## 2011-10-18 ENCOUNTER — Ambulatory Visit (HOSPITAL_COMMUNITY): Payer: Medicare PPO

## 2011-11-20 ENCOUNTER — Ambulatory Visit (INDEPENDENT_AMBULATORY_CARE_PROVIDER_SITE_OTHER): Payer: Medicare PPO | Admitting: *Deleted

## 2011-11-20 DIAGNOSIS — I495 Sick sinus syndrome: Secondary | ICD-10-CM

## 2011-11-20 DIAGNOSIS — Z950 Presence of cardiac pacemaker: Secondary | ICD-10-CM

## 2011-11-23 LAB — REMOTE PACEMAKER DEVICE
AL IMPEDENCE PM: 444 Ohm
BATTERY VOLTAGE: 2.76 V
RV LEAD AMPLITUDE: 22.4 mv
VENTRICULAR PACING PM: 19

## 2011-11-30 ENCOUNTER — Encounter: Payer: Self-pay | Admitting: *Deleted

## 2011-12-11 ENCOUNTER — Encounter: Payer: Self-pay | Admitting: Internal Medicine

## 2012-02-26 ENCOUNTER — Ambulatory Visit (INDEPENDENT_AMBULATORY_CARE_PROVIDER_SITE_OTHER): Payer: Medicare PPO | Admitting: *Deleted

## 2012-02-26 DIAGNOSIS — I495 Sick sinus syndrome: Secondary | ICD-10-CM

## 2012-02-26 DIAGNOSIS — Z950 Presence of cardiac pacemaker: Secondary | ICD-10-CM

## 2012-02-26 LAB — REMOTE PACEMAKER DEVICE
AL IMPEDENCE PM: 450 Ohm
AL THRESHOLD: 0.5 V
ATRIAL PACING PM: 41
BAMS-0001: 175 {beats}/min
RV LEAD THRESHOLD: 0.75 V

## 2012-02-28 ENCOUNTER — Encounter: Payer: Self-pay | Admitting: *Deleted

## 2012-03-13 ENCOUNTER — Encounter: Payer: Self-pay | Admitting: Internal Medicine

## 2012-04-08 ENCOUNTER — Ambulatory Visit (INDEPENDENT_AMBULATORY_CARE_PROVIDER_SITE_OTHER): Payer: Medicare PPO | Admitting: Adult Health

## 2012-04-08 ENCOUNTER — Encounter: Payer: Self-pay | Admitting: Adult Health

## 2012-04-08 ENCOUNTER — Other Ambulatory Visit (HOSPITAL_COMMUNITY): Payer: Self-pay | Admitting: Family Medicine

## 2012-04-08 ENCOUNTER — Ambulatory Visit (HOSPITAL_COMMUNITY)
Admission: RE | Admit: 2012-04-08 | Discharge: 2012-04-08 | Disposition: A | Discharge status: Home / Self Care | Payer: Medicare PPO | Source: Ambulatory Visit | Attending: Family Medicine | Admitting: Family Medicine

## 2012-04-08 VITALS — BP 112/76 | HR 71 | Ht 66.0 in | Wt 189.1 lb

## 2012-04-08 DIAGNOSIS — M546 Pain in thoracic spine: Secondary | ICD-10-CM | POA: Insufficient documentation

## 2012-04-08 DIAGNOSIS — M810 Age-related osteoporosis without current pathological fracture: Secondary | ICD-10-CM

## 2012-04-08 DIAGNOSIS — M8448XA Pathological fracture, other site, initial encounter for fracture: Secondary | ICD-10-CM | POA: Insufficient documentation

## 2012-04-08 DIAGNOSIS — I495 Sick sinus syndrome: Secondary | ICD-10-CM

## 2012-04-08 DIAGNOSIS — Z950 Presence of cardiac pacemaker: Secondary | ICD-10-CM

## 2012-04-08 NOTE — Assessment & Plan Note (Signed)
Continue scheduled follow up with Dr. Ladona Ridgel

## 2012-04-08 NOTE — Assessment & Plan Note (Signed)
Will request recent labs from Dr. Sherwood Gambler for our records.

## 2012-04-08 NOTE — Assessment & Plan Note (Signed)
Good control of BP. Continue current medication regimen. Follow up in one year.

## 2012-04-08 NOTE — Assessment & Plan Note (Signed)
NO evidence of fluid overload or decompensation. No symptoms to suggest this.  Continue current medications.

## 2012-04-08 NOTE — Progress Notes (Deleted)
Name: Kari Hall    DOB: 04/01/1934  Age: 78 y.o.  MR#: [id number]       PCP:  Cassell Smiles., MD      Insurance: Payor: Francine Graven MEDICARE  Plan: [employer] MEDICARE CHOICE PPO  Product Type: *No Product type*    CC:   No chief complaint on file.   VS Filed Vitals:   04/08/12 1244  BP: 112/76  Pulse: 71  Height: 5\' 6"  (1.676 m)  Weight: 189 lb 1.9 oz (85.784 kg)    Weights Current Weight  04/08/12 189 lb 1.9 oz (85.784 kg)  10/09/11 191 lb (86.637 kg)  09/13/11 187 lb 0.6 oz (84.841 kg)    Blood Pressure  BP Readings from Last 3 Encounters:  04/08/12 112/76  10/09/11 130/82  09/13/11 142/86     Admit date:  (Not on file) Last encounter with RMR:  Visit date not found   Allergy Review of patient's allergies indicates no known allergies.  Current Outpatient Prescriptions  Medication Sig Dispense Refill  . aspirin 81 MG tablet Take 81 mg by mouth daily.        Marland Kitchen atenolol (TENORMIN) 50 MG tablet Take 50 mg by mouth daily.        . Calcium Carbonate (CALTRATE 600) 1500 MG TABS Take 1,500 mg by mouth daily.       Marland Kitchen HYDROcodone-acetaminophen (VICODIN) 5-500 MG per tablet Take 1 tablet by mouth every 6 (six) hours as needed. Pain      . ibuprofen (ADVIL,MOTRIN) 200 MG tablet Take 200 mg by mouth as needed.      Marland Kitchen levothyroxine (SYNTHROID, LEVOTHROID) 75 MCG tablet Take 75 mcg by mouth daily.        Marland Kitchen omeprazole (PRILOSEC) 20 MG capsule Take 20 mg by mouth daily.        . potassium chloride SA (K-DUR,KLOR-CON) 20 MEQ tablet Take 20 mEq by mouth daily.      . [DISCONTINUED] ezetimibe-simvastatin (VYTORIN) 10-40 MG per tablet Take 1 tablet by mouth at bedtime.         No current facility-administered medications for this visit.    Discontinued Meds:    Medications Discontinued During This Encounter  Medication Reason  . POTASSIUM GLUCONATE PO Error  . furosemide (LASIX) 40 MG tablet Error  . vitamin C (ASCORBIC ACID) 500 MG tablet Error  . dicyclomine (BENTYL) 10 MG capsule  Error    Patient Active Problem List  Diagnosis  . HYPOTHYROIDISM  . DYSLIPIDEMIA  . TOBACCO ABUSE  . HYPERTENSION  . CARDIAC PACEMAKER IN SITU  . IBS  . EPIGASTRIC PAIN  . Edema  . Sinoatrial node dysfunction  . Chronic diastolic congestive heart failure    LABS    Component Value Date/Time   NA 143 07/02/2011 1150   NA 144 05/04/2011 1000   NA 142 08/24/2010 1344   K 2.8* 07/02/2011 1150   K 3.6 05/04/2011 1000   K 3.8 08/24/2010 1344   CL 103 07/02/2011 1150   CL 103 05/04/2011 1000   CL 106 08/24/2010 1344   CO2 28 07/02/2011 1150   CO2 29 05/04/2011 1000   CO2 25 08/24/2010 1344   GLUCOSE 122* 07/02/2011 1150   GLUCOSE 89 05/04/2011 1000   GLUCOSE 88 08/24/2010 1344   BUN 19 07/02/2011 1150   BUN 20 05/04/2011 1000   BUN 17 08/24/2010 1344   CREATININE 0.97 07/02/2011 1150   CREATININE 1.03 05/04/2011 1000   CREATININE 0.97 08/24/2010 1344  CREATININE 0.97 03/17/2010 2313   CREATININE 1.89* 10/04/2008 1320   CALCIUM 8.6 07/02/2011 1150   CALCIUM 8.9 05/04/2011 1000   CALCIUM 9.0 08/24/2010 1344   GFRNONAA 56* 07/02/2011 1150   GFRNONAA 26* 10/04/2008 1320   GFRNONAA 21* 10/04/2008 1025   GFRAA 65* 07/02/2011 1150   GFRAA  Value: 32        The eGFR has been calculated using the MDRD equation. This calculation has not been validated in all clinical situations. eGFR's persistently <60 mL/min signify possible Chronic Kidney Disease.* 10/04/2008 1320   GFRAA  Value: 25        The eGFR has been calculated using the MDRD equation. This calculation has not been validated in all clinical situations. eGFR's persistently <60 mL/min signify possible Chronic Kidney Disease.* 10/04/2008 1025   CMP     Component Value Date/Time   NA 143 07/02/2011 1150   K 2.8* 07/02/2011 1150   CL 103 07/02/2011 1150   CO2 28 07/02/2011 1150   GLUCOSE 122* 07/02/2011 1150   BUN 19 07/02/2011 1150   CREATININE 0.97 07/02/2011 1150   CREATININE 1.03 05/04/2011 1000   CALCIUM 8.6 07/02/2011 1150   PROT 7.3 07/02/2011 1150    ALBUMIN 3.8 07/02/2011 1150   AST 17 07/02/2011 1150   ALT 13 07/02/2011 1150   ALKPHOS 91 07/02/2011 1150   BILITOT 0.5 07/02/2011 1150   GFRNONAA 56* 07/02/2011 1150   GFRAA 65* 07/02/2011 1150       Component Value Date/Time   WBC 6.4 07/02/2011 1150   WBC 4.6 03/17/2010 2313   WBC 7.1 10/04/2008 1025   HGB 13.4 07/02/2011 1150   HGB 13.1 03/17/2010 2313   HGB 13.9 10/04/2008 1025   HCT 38.5 07/02/2011 1150   HCT 39.9 03/17/2010 2313   HCT 39.8 10/04/2008 1025   MCV 91.0 07/02/2011 1150   MCV 93.9 03/17/2010 2313   MCV 94.2 10/04/2008 1025    Lipid Panel  No results found for this basename: chol, trig, hdl, cholhdl, vldl, ldlcalc    ABG No results found for this basename: phart, pco2, pco2art, po2, po2art, hco3, tco2, acidbasedef, o2sat     Lab Results  Component Value Date   TSH 1.369 08/24/2010   BNP (last 3 results)  Recent Labs  07/02/11 1150  PROBNP 239.3   Cardiac Panel (last 3 results) No results found for this basename: CKTOTAL, CKMB, TROPONINI, RELINDX,  in the last 72 hours  Iron/TIBC/Ferritin No results found for this basename: iron, tibc, ferritin     EKG Orders placed in visit on 04/08/12  . EKG 12-LEAD     Prior Assessment and Plan Problem List as of 04/08/2012     ICD-9-CM     Cardiology Problems   DYSLIPIDEMIA   Last Assessment & Plan   10/09/2011 Office Visit Written 10/09/2011  3:44 PM by Jodelle Gross, NP     She is followed by Dr. Sherwood Gambler for ongoing labs to include cholesterol studies, and thyroid levels. She states that she has an appointment with him within the next month.    HYPERTENSION   Last Assessment & Plan   10/09/2011 Office Visit Written 10/09/2011  3:42 PM by Jodelle Gross, NP     Review of blood pressure recordings shows excellent control. Blood pressure running 120 -130 systolic. She is avoiding salt. Reading labels, and not adding extra salt to her food. She is getting use to a low-salt diet and on occasion when she does  have  something salt he is unable to tolerate it as it becomes too salty to eat. I am pleased that she has been following her medical regimen and has better blood pressure control. I will make no changes. She is encouraged to continue this behavior.    Sinoatrial node dysfunction   Chronic diastolic congestive heart failure   Last Assessment & Plan   09/13/2011 Office Visit Written 09/13/2011  1:24 PM by Dyann Kief, PA     No evidence of heart failure on exam today.      Other   Edema   Last Assessment & Plan   09/13/2011 Office Visit Written 09/13/2011  1:23 PM by Dyann Kief, PA     Patient has no evidence of edema today. I believe it is probably due to her dietary indiscretion. I've asked her to follow low-sodium diet.    HYPOTHYROIDISM   Last Assessment & Plan   09/08/2010 Office Visit Written 09/08/2010 11:25 AM by Jodelle Gross, NP     She is followed by Robbie Lis for this. Labs have been recently drawn, she states that she was told they were normal.    TOBACCO ABUSE   CARDIAC PACEMAKER IN SITU   Last Assessment & Plan   10/09/2011 Office Visit Written 10/09/2011  3:42 PM by Jodelle Gross, NP     She offers no complaints with her pacemaker. She is due to followup with Dr. Graciela Husbands in usual visits for interrogation.    IBS   EPIGASTRIC PAIN       Imaging: No results found.

## 2012-04-08 NOTE — Progress Notes (Signed)
   HPI: Mrs. Hennen is a pleasant 78 y/o patient of Dr.Rothbart we are seeing for ongoing assessment and treatment of symptomatic bradycardia s/p Medtronic pacemaker in 2007, hypertension, and diastolic CHF. She comes today without cardiac complaints. She is followed by her PCP, Dr. Sherwood Gambler for ongoing labs for cholesterol evaluation and general lab assessment.  No Known Allergies  Current Outpatient Prescriptions  Medication Sig Dispense Refill  . aspirin 81 MG tablet Take 81 mg by mouth daily.        Marland Kitchen atenolol (TENORMIN) 50 MG tablet Take 50 mg by mouth daily.        . Calcium Carbonate (CALTRATE 600) 1500 MG TABS Take 1,500 mg by mouth daily.       Marland Kitchen HYDROcodone-acetaminophen (VICODIN) 5-500 MG per tablet Take 1 tablet by mouth every 6 (six) hours as needed. Pain      . ibuprofen (ADVIL,MOTRIN) 200 MG tablet Take 200 mg by mouth as needed.      Marland Kitchen levothyroxine (SYNTHROID, LEVOTHROID) 75 MCG tablet Take 75 mcg by mouth daily.        Marland Kitchen omeprazole (PRILOSEC) 20 MG capsule Take 20 mg by mouth daily.        . potassium chloride SA (K-DUR,KLOR-CON) 20 MEQ tablet Take 20 mEq by mouth daily.      . [DISCONTINUED] ezetimibe-simvastatin (VYTORIN) 10-40 MG per tablet Take 1 tablet by mouth at bedtime.         No current facility-administered medications for this visit.    Past Medical History  Diagnosis Date  . GERD (gastroesophageal reflux disease)   . Dyslipidemia   . Hypertension   . Hypothyroidism   . IBS (irritable bowel syndrome)   . Diverticulitis   . Sinoatrial node dysfunction     Past Surgical History  Procedure Laterality Date  . Cholecystectomy    . Appendectomy    . Colectomy      SECONDARY TO DIVERTICULITIS  . Ectopic pregnancy surgery    . Pacemaker placement      ROS: Review of systems complete and found to be negative unless listed above  PHYSICAL EXAM BP 112/76  Pulse 71  Ht 5\' 6"  (1.676 m)  Wt 189 lb 1.9 oz (85.784 kg)  BMI 30.54 kg/m2  General: Well  developed, well nourished, in no acute distress Head: Eyes PERRLA, No xanthomas.   Normal cephalic and atramatic  Lungs: Clear bilaterally to auscultation and percussion. Heart: HRRR S1 S2, without MRG.  Pulses are 2+ & equal.            No carotid bruit. No JVD.  No abdominal bruits. No femoral bruits. Abdomen: Bowel sounds are positive, abdomen soft and non-tender without masses or                  Hernia's noted. Msk:  Back normal, normal gait. Normal strength and tone for age. Extremities: No clubbing, cyanosis or edema.  DP +1 Neuro: Alert and oriented X 3. Psych:  Good affect, responds appropriately  EKG: Paced rhythm  ASSESSMENT AND PLAN

## 2012-04-08 NOTE — Patient Instructions (Addendum)
Your physician recommends that you schedule a follow-up appointment in: 1 year  

## 2012-05-06 ENCOUNTER — Encounter: Payer: Self-pay | Admitting: Internal Medicine

## 2012-05-06 ENCOUNTER — Ambulatory Visit (INDEPENDENT_AMBULATORY_CARE_PROVIDER_SITE_OTHER): Payer: Medicare PPO | Admitting: Internal Medicine

## 2012-05-06 VITALS — BP 102/80 | HR 64 | Ht 66.0 in | Wt 190.4 lb

## 2012-05-06 DIAGNOSIS — I495 Sick sinus syndrome: Secondary | ICD-10-CM

## 2012-05-06 DIAGNOSIS — Z950 Presence of cardiac pacemaker: Secondary | ICD-10-CM

## 2012-05-06 DIAGNOSIS — I10 Essential (primary) hypertension: Secondary | ICD-10-CM

## 2012-05-06 LAB — PACEMAKER DEVICE OBSERVATION
AL AMPLITUDE: 5.6 mv
BAMS-0001: 175 {beats}/min
RV LEAD AMPLITUDE: 11.2 mv
RV LEAD IMPEDENCE PM: 526 Ohm
RV LEAD THRESHOLD: 0.5 V

## 2012-05-06 NOTE — Patient Instructions (Addendum)
Your physician recommends that you schedule a follow-up appointment in:  1 - [facility] on June 30 th and 1 year with Dr Ladona Ridgel

## 2012-05-06 NOTE — Assessment & Plan Note (Signed)
Her Medtronic dual-chamber pacemaker is working normally. We'll plan to recheck in several months. 

## 2012-05-06 NOTE — Progress Notes (Signed)
HPI Mrs. Axtell returns today for followup. She is a very pleasant 78 year old woman, with a history of symptomatic bradycardia, status post permanent pacemaker insertion. She also is a history of hypertension. In the interim, she has done well. She denies chest pain or shortness of breath. She does admit to some dietary indiscretion, and has gained weight. No Known Allergies   Current Outpatient Prescriptions  Medication Sig Dispense Refill  . aspirin 81 MG tablet Take 81 mg by mouth daily.        Marland Kitchen atenolol (TENORMIN) 50 MG tablet Take 50 mg by mouth daily.        . Calcium Carbonate (CALTRATE 600) 1500 MG TABS Take 1,500 mg by mouth daily.       Marland Kitchen HYDROcodone-acetaminophen (VICODIN) 5-500 MG per tablet Take 1 tablet by mouth every 6 (six) hours as needed. Pain      . ibuprofen (ADVIL,MOTRIN) 200 MG tablet Take 200 mg by mouth as needed.      Marland Kitchen levothyroxine (SYNTHROID, LEVOTHROID) 75 MCG tablet Take 75 mcg by mouth daily.        Marland Kitchen omeprazole (PRILOSEC) 20 MG capsule Take 20 mg by mouth daily.        . potassium chloride SA (K-DUR,KLOR-CON) 20 MEQ tablet Take 20 mEq by mouth daily.      . [DISCONTINUED] ezetimibe-simvastatin (VYTORIN) 10-40 MG per tablet Take 1 tablet by mouth at bedtime.         No current facility-administered medications for this visit.     Past Medical History  Diagnosis Date  . GERD (gastroesophageal reflux disease)   . Dyslipidemia   . Hypertension   . Hypothyroidism   . IBS (irritable bowel syndrome)   . Diverticulitis   . Sinoatrial node dysfunction     ROS:   All systems reviewed and negative except as noted in the HPI.   Past Surgical History  Procedure Laterality Date  . Cholecystectomy    . Appendectomy    . Colectomy      SECONDARY TO DIVERTICULITIS  . Ectopic pregnancy surgery    . Pacemaker placement       No family history on file.   History   Social History  . Marital Status: Divorced    Spouse Name: N/A    Number of Children:  N/A  . Years of Education: N/A   Occupational History  . Not on file.   Social History Main Topics  . Smoking status: Former Smoker    Types: Cigarettes    Quit date: 02/24/1979  . Smokeless tobacco: Not on file  . Alcohol Use: No  . Drug Use: No  . Sexually Active: No   Other Topics Concern  . Not on file   Social History Narrative  . No narrative on file     BP 102/80  Pulse 64  Ht 5\' 6"  (1.676 m)  Wt 190 lb 6.4 oz (86.365 kg)  BMI 30.75 kg/m2  SpO2 98%  Physical Exam:  Well appearing 78 year old woman,NAD HEENT: Unremarkable Neck:  6 cm JVD, no thyromegally Lungs:  Clear with no wheezes, rales, or rhonchi. Well-healed pacemaker incision. HEART:  Regular rate rhythm, no murmurs, no rubs, no clicks Abd:  soft, positive bowel sounds, no organomegally, no rebound, no guarding Ext:  2 plus pulses, no edema, no cyanosis, no clubbing Skin:  No rashes no nodules Neuro:  CN II through XII intact, motor grossly intact   DEVICE  Normal device function.  See PaceArt  for details.   Assess/Plan:

## 2012-05-06 NOTE — Assessment & Plan Note (Signed)
Her blood pressure is very well controlled today. She will continue her current medical therapy, and maintain a low-sodium diet.

## 2012-05-13 ENCOUNTER — Encounter: Payer: Self-pay | Admitting: Internal Medicine

## 2012-08-12 ENCOUNTER — Encounter: Payer: Medicare PPO | Admitting: *Deleted

## 2012-08-21 ENCOUNTER — Encounter: Payer: Self-pay | Admitting: *Deleted

## 2012-08-26 ENCOUNTER — Ambulatory Visit (INDEPENDENT_AMBULATORY_CARE_PROVIDER_SITE_OTHER): Payer: Medicare PPO | Admitting: *Deleted

## 2012-08-26 DIAGNOSIS — Z950 Presence of cardiac pacemaker: Secondary | ICD-10-CM

## 2012-08-26 DIAGNOSIS — I495 Sick sinus syndrome: Secondary | ICD-10-CM

## 2012-09-03 LAB — REMOTE PACEMAKER DEVICE
ATRIAL PACING PM: 28
BAMS-0001: 175 {beats}/min
BATTERY VOLTAGE: 2.75 V
RV LEAD IMPEDENCE PM: 484 Ohm
RV LEAD THRESHOLD: 0.5 V
VENTRICULAR PACING PM: 21

## 2012-10-04 ENCOUNTER — Encounter: Payer: Self-pay | Admitting: *Deleted

## 2012-10-28 ENCOUNTER — Encounter: Payer: Self-pay | Admitting: Internal Medicine

## 2012-11-12 ENCOUNTER — Telehealth: Payer: Self-pay | Admitting: Internal Medicine

## 2012-11-12 NOTE — Telephone Encounter (Signed)
Patient wants to know if it is ok if she starts to work out at [employer] with having a pacemaker. Please call patient. /t gs

## 2012-11-12 NOTE — Telephone Encounter (Signed)
Spoke with patient.  Normal exercise is fine just avoid any magnets.  Patient voiced understanding.

## 2012-12-02 ENCOUNTER — Ambulatory Visit (INDEPENDENT_AMBULATORY_CARE_PROVIDER_SITE_OTHER): Payer: Medicare PPO | Admitting: *Deleted

## 2012-12-02 DIAGNOSIS — Z950 Presence of cardiac pacemaker: Secondary | ICD-10-CM

## 2012-12-02 DIAGNOSIS — I495 Sick sinus syndrome: Secondary | ICD-10-CM

## 2012-12-11 LAB — REMOTE PACEMAKER DEVICE
AL AMPLITUDE: 2.8 mv
BAMS-0001: 175 {beats}/min
RV LEAD AMPLITUDE: 22.4 mv
RV LEAD THRESHOLD: 0.5 V
VENTRICULAR PACING PM: 17

## 2012-12-20 ENCOUNTER — Encounter: Payer: Self-pay | Admitting: *Deleted

## 2012-12-30 ENCOUNTER — Encounter: Payer: Self-pay | Admitting: Internal Medicine

## 2013-02-26 ENCOUNTER — Telehealth: Payer: Self-pay | Admitting: Internal Medicine

## 2013-02-26 NOTE — Telephone Encounter (Signed)
Pt states her SOB started in the middle of the night last night, pt can not find her blood pressure machine and advised she was to have her pacemaker change of this upcoming march and notes breathless on and off for a couple of weeks, pt notes she is shaking because she is  from being weak that also started last night,denies chest pain, notes her feet and legs swells that come down with propping up, advised pt we will call her back with next steps however if her sxs worsen to please seek evaluation in the ED, pt understood, please advise

## 2013-02-26 NOTE — Telephone Encounter (Signed)
Pt declined apt with Dr. Cristopher Peru this afternoon per cancellation, pt accepted apt with Leonia Reader NP this upcoming Friday 02-28-13 at 3:10pm pt accepted apt and made aware to go to the ED if sxs worsen, pt understood

## 2013-02-26 NOTE — Telephone Encounter (Signed)
Patient states that last night she started having trouble catching her breath and today she is feeling really weak and nervous.  / tgs

## 2013-02-28 ENCOUNTER — Encounter: Payer: Self-pay | Admitting: Adult Health

## 2013-02-28 ENCOUNTER — Ambulatory Visit (INDEPENDENT_AMBULATORY_CARE_PROVIDER_SITE_OTHER): Payer: Medicare PPO | Admitting: Adult Health

## 2013-02-28 VITALS — BP 150/81 | HR 77 | Ht 65.0 in | Wt 193.0 lb

## 2013-02-28 DIAGNOSIS — R0602 Shortness of breath: Secondary | ICD-10-CM

## 2013-02-28 DIAGNOSIS — I10 Essential (primary) hypertension: Secondary | ICD-10-CM

## 2013-02-28 DIAGNOSIS — Z950 Presence of cardiac pacemaker: Secondary | ICD-10-CM

## 2013-02-28 NOTE — Progress Notes (Deleted)
Name: Kari Hall    DOB: 08/02/1934  Age: 79 y.o.  MR#: [id number]       PCP:  Glo Herring., MD      Insurance: Payor: [employer] MEDICARE / Plan: [employer] MEDICARE CHOICE PPO / Product Type: *No Product type* /   CC:    Chief Complaint  Patient presents with  . Congestive Heart Failure  . Hypertension    VS Filed Vitals:   02/28/13 1428  BP: 150/81  Pulse: 77  Height: $Remove'5\' 5"'dQnLTqP$  (1.651 m)  Weight: 193 lb (87.544 kg)    Weights Current Weight  02/28/13 193 lb (87.544 kg)  05/06/12 190 lb 6.4 oz (86.365 kg)  04/08/12 189 lb 1.9 oz (85.784 kg)    Blood Pressure  BP Readings from Last 3 Encounters:  02/28/13 150/81  05/06/12 102/80  04/08/12 112/76     Admit date:  (Not on file) Last encounter with RMR:  Visit date not found   Allergy Review of patient's allergies indicates no known allergies.  Current Outpatient Prescriptions  Medication Sig Dispense Refill  . aspirin 81 MG tablet Take 81 mg by mouth daily.        Marland Kitchen atenolol (TENORMIN) 50 MG tablet Take 50 mg by mouth daily.        Marland Kitchen ibuprofen (ADVIL,MOTRIN) 200 MG tablet Take 200 mg by mouth as needed.      Marland Kitchen levothyroxine (SYNTHROID, LEVOTHROID) 75 MCG tablet Take 75 mcg by mouth daily.        . potassium chloride SA (K-DUR,KLOR-CON) 20 MEQ tablet Take 20 mEq by mouth 2 (two) times daily.       . [DISCONTINUED] ezetimibe-simvastatin (VYTORIN) 10-40 MG per tablet Take 1 tablet by mouth at bedtime.         No current facility-administered medications for this visit.    Discontinued Meds:    Medications Discontinued During This Encounter  Medication Reason  . omeprazole (PRILOSEC) 20 MG capsule Error  . HYDROcodone-acetaminophen (VICODIN) 5-500 MG per tablet Error  . Calcium Carbonate (CALTRATE 600) 1500 MG TABS Error    Patient Active Problem List   Diagnosis Date Noted  . Chronic diastolic congestive heart failure 05/04/2011  . Sinoatrial node dysfunction   . Edema 08/24/2010  . IBS 03/15/2010  . EPIGASTRIC  PAIN 03/15/2010  . CARDIAC PACEMAKER IN SITU 06/09/2009  . HYPOTHYROIDISM 06/02/2009  . DYSLIPIDEMIA 06/02/2009  . TOBACCO ABUSE 06/02/2009  . HYPERTENSION 06/02/2009    LABS    Component Value Date/Time   NA 143 07/02/2011 1150   NA 144 05/04/2011 1000   NA 142 08/24/2010 1344   K 2.8* 07/02/2011 1150   K 3.6 05/04/2011 1000   K 3.8 08/24/2010 1344   CL 103 07/02/2011 1150   CL 103 05/04/2011 1000   CL 106 08/24/2010 1344   CO2 28 07/02/2011 1150   CO2 29 05/04/2011 1000   CO2 25 08/24/2010 1344   GLUCOSE 122* 07/02/2011 1150   GLUCOSE 89 05/04/2011 1000   GLUCOSE 88 08/24/2010 1344   BUN 19 07/02/2011 1150   BUN 20 05/04/2011 1000   BUN 17 08/24/2010 1344   CREATININE 0.97 07/02/2011 1150   CREATININE 1.03 05/04/2011 1000   CREATININE 0.97 08/24/2010 1344   CREATININE 0.97 03/17/2010 2313   CREATININE 1.89* 10/04/2008 1320   CALCIUM 8.6 07/02/2011 1150   CALCIUM 8.9 05/04/2011 1000   CALCIUM 9.0 08/24/2010 1344   GFRNONAA 56* 07/02/2011 1150   GFRNONAA 26* 10/04/2008 1320  GFRNONAA 21* 10/04/2008 1025   GFRAA 65* 07/02/2011 1150   GFRAA  Value: 32        The eGFR has been calculated using the MDRD equation. This calculation has not been validated in all clinical situations. eGFR's persistently <60 mL/min signify possible Chronic Kidney Disease.* 10/04/2008 1320   GFRAA  Value: 25        The eGFR has been calculated using the MDRD equation. This calculation has not been validated in all clinical situations. eGFR's persistently <60 mL/min signify possible Chronic Kidney Disease.* 10/04/2008 1025   CMP     Component Value Date/Time   NA 143 07/02/2011 1150   K 2.8* 07/02/2011 1150   CL 103 07/02/2011 1150   CO2 28 07/02/2011 1150   GLUCOSE 122* 07/02/2011 1150   BUN 19 07/02/2011 1150   CREATININE 0.97 07/02/2011 1150   CREATININE 1.03 05/04/2011 1000   CALCIUM 8.6 07/02/2011 1150   PROT 7.3 07/02/2011 1150   ALBUMIN 3.8 07/02/2011 1150   AST 17 07/02/2011 1150   ALT 13 07/02/2011 1150   ALKPHOS 91  07/02/2011 1150   BILITOT 0.5 07/02/2011 1150   GFRNONAA 56* 07/02/2011 1150   GFRAA 65* 07/02/2011 1150       Component Value Date/Time   WBC 6.4 07/02/2011 1150   WBC 4.6 03/17/2010 2313   WBC 7.1 10/04/2008 1025   HGB 13.4 07/02/2011 1150   HGB 13.1 03/17/2010 2313   HGB 13.9 10/04/2008 1025   HCT 38.5 07/02/2011 1150   HCT 39.9 03/17/2010 2313   HCT 39.8 10/04/2008 1025   MCV 91.0 07/02/2011 1150   MCV 93.9 03/17/2010 2313   MCV 94.2 10/04/2008 1025    Lipid Panel  No results found for this basename: chol, trig, hdl, cholhdl, vldl, ldlcalc    ABG No results found for this basename: phart, pco2, pco2art, po2, po2art, hco3, tco2, acidbasedef, o2sat     Lab Results  Component Value Date   TSH 1.369 08/24/2010   BNP (last 3 results) No results found for this basename: PROBNP,  in the last 8760 hours Cardiac Panel (last 3 results) No results found for this basename: CKTOTAL, CKMB, TROPONINI, RELINDX,  in the last 72 hours  Iron/TIBC/Ferritin No results found for this basename: iron, tibc, ferritin     EKG Orders placed in visit on 05/13/12  . EKG 12-LEAD     Prior Assessment and Plan Problem List as of 02/28/2013     Cardiovascular and Mediastinum   HYPERTENSION   Last Assessment & Plan   05/06/2012 Office Visit Written 05/06/2012 11:32 AM by Evans Lance, MD     Her blood pressure is very well controlled today. She will continue her current medical therapy, and maintain a low-sodium diet.    Sinoatrial node dysfunction   Chronic diastolic congestive heart failure   Last Assessment & Plan   04/08/2012 Office Visit Written 04/08/2012  1:18 PM by Lendon Colonel, NP     NO evidence of fluid overload or decompensation. No symptoms to suggest this.  Continue current medications.      Digestive   IBS     Endocrine   HYPOTHYROIDISM   Last Assessment & Plan   09/08/2010 Office Visit Written 09/08/2010 11:25 AM by Lendon Colonel, NP     She is followed by Larene Pickett for this.  Labs have been recently drawn, she states that she was told they were normal.      Other  Edema   Last Assessment & Plan   09/13/2011 Office Visit Written 09/13/2011  1:23 PM by Imogene Burn, PA     Patient has no evidence of edema today. I believe it is probably due to her dietary indiscretion. I've asked her to follow low-sodium diet.    DYSLIPIDEMIA   Last Assessment & Plan   04/08/2012 Office Visit Written 04/08/2012  1:19 PM by Lendon Colonel, NP     Will request recent labs from Dr. Gerarda Fraction for our records.    TOBACCO ABUSE   CARDIAC PACEMAKER IN SITU   Last Assessment & Plan   05/06/2012 Office Visit Written 05/06/2012 11:31 AM by Evans Lance, MD     Her Medtronic dual-chamber pacemaker is working normally. We'll plan to recheck in several months.    EPIGASTRIC PAIN       Imaging: No results found.

## 2013-02-28 NOTE — Assessment & Plan Note (Signed)
I have given her lasix 20 mg po daily. She is to have Pro-BNP, CXR, BMET, and CBC today.

## 2013-02-28 NOTE — Assessment & Plan Note (Signed)
Blood pressure is elevated. She is taking medications as directed. There is some evidence of fluid retention and 5 lbs wt gain. Will see her in 2 weeks.

## 2013-02-28 NOTE — Progress Notes (Signed)
    HPI: [name] is a 79 year old former patient of Dr. Lattie Haw we are following for ongoing assessment and management of symptomatic bradycardia, status post Medtronic pacemaker in 2007, with hypertension and diastolic CHF chest lasting one year ago, and is being followed in the pacemaker clinic as well.    She comes today with complaints of PND, DOE, and elevated HR. She was seen by PCP at [facility] where she had TSH drawn which was found to be negative. She states that she has to get out of bed and sit up due the breathing status. HR sometimes is related to dyspnea.      No Known Allergies  Current Outpatient Prescriptions  Medication Sig Dispense Refill  . aspirin 81 MG tablet Take 81 mg by mouth daily.        Marland Kitchen atenolol (TENORMIN) 50 MG tablet Take 50 mg by mouth daily.        Marland Kitchen ibuprofen (ADVIL,MOTRIN) 200 MG tablet Take 200 mg by mouth as needed.      Marland Kitchen levothyroxine (SYNTHROID, LEVOTHROID) 75 MCG tablet Take 75 mcg by mouth daily.        . potassium chloride SA (K-DUR,KLOR-CON) 20 MEQ tablet Take 20 mEq by mouth 2 (two) times daily.       . [DISCONTINUED] ezetimibe-simvastatin (VYTORIN) 10-40 MG per tablet Take 1 tablet by mouth at bedtime.         No current facility-administered medications for this visit.    Past Medical History  Diagnosis Date  . GERD (gastroesophageal reflux disease)   . Dyslipidemia   . Hypertension   . Hypothyroidism   . IBS (irritable bowel syndrome)   . Diverticulitis   . Sinoatrial node dysfunction     Past Surgical History  Procedure Laterality Date  . Cholecystectomy    . Appendectomy    . Colectomy      SECONDARY TO DIVERTICULITIS  . Ectopic pregnancy surgery    . Pacemaker placement      ROS:.NROS  PHYSICAL EXAM BP 150/81  Pulse 77  Ht 5\' 5"  (1.651 m)  Wt 193 lb (87.544 kg)  BMI 32.12 kg/m2  General: Well developed, well nourished, in no acute distress Head: Eyes PERRLA, No xanthomas.   Normal cephalic and atramatic  Lungs: No  wheezes or crackles are noted. Essentially clear. Heart: HRRR S1 S2, without MRG.  Pulses are 2+ & equal.            No carotid bruit. No JVD.  No abdominal bruits. No femoral bruits. Abdomen: Bowel sounds are positive, abdomen soft and non-tender without masses or                  Hernia's noted. Msk:  Back normal, normal gait. Normal strength and tone for age. Extremities: No clubbing, cyanosis 1+ edema.  DP +1 Neuro: Alert and oriented X 3. Psych:  Good affect, responds appropriately    ASSESSMENT AND PLAN

## 2013-02-28 NOTE — Assessment & Plan Note (Signed)
She is due for PMK check on the 23rd remote. She is due for in person pacemaker check in March.

## 2013-02-28 NOTE — Patient Instructions (Signed)
Your physician recommends that you schedule a follow-up appointment in: 2 WEEKS, KEEP YOUR APPOINTMENT WITH Dr. Cristopher Peru ON [facility] [address] AT 8:45AM [place] 03-07-13  Your physician recommends that you return for lab work in: TODAY ( [place] BNP,BMET,CBC)  A chest x-Ciresi takes a picture of the organs and structures inside the chest, including the heart, lungs, and blood vessels. This test can show several things, including, whether the heart is enlarges; whether fluid is building up in the lungs; and whether pacemaker / defibrillator leads are still in place.  WE WILL CALL YOU WITH YOUR TEST RESULTS/INSTRUCTIONS/NEXT STEPS ONCE RECEIVED BY THE PROVIDER   Your physician has recommended you make the following change in your medication:   1) START PRILOSEC 20MG  ONCE DAILY 2) START LASIX 20MG  ONCE DAILY

## 2013-03-01 LAB — CBC
HEMATOCRIT: 41.5 % (ref 36.0–46.0)
HEMOGLOBIN: 14.2 g/dL (ref 12.0–15.0)
MCH: 31.3 pg (ref 26.0–34.0)
MCHC: 34.2 g/dL (ref 30.0–36.0)
MCV: 91.6 fL (ref 78.0–100.0)
Platelets: 145 10*3/uL — ABNORMAL LOW (ref 150–400)
RBC: 4.53 MIL/uL (ref 3.87–5.11)
RDW: 13.5 % (ref 11.5–15.5)
WBC: 5.3 10*3/uL (ref 4.0–10.5)

## 2013-03-01 LAB — BASIC METABOLIC PANEL
BUN: 19 mg/dL (ref 6–23)
CALCIUM: 9.3 mg/dL (ref 8.4–10.5)
CHLORIDE: 104 meq/L (ref 96–112)
CO2: 30 meq/L (ref 19–32)
Creat: 1.06 mg/dL (ref 0.50–1.10)
GLUCOSE: 123 mg/dL — AB (ref 70–99)
POTASSIUM: 4.1 meq/L (ref 3.5–5.3)
SODIUM: 140 meq/L (ref 135–145)

## 2013-03-02 LAB — BRAIN NATRIURETIC PEPTIDE: Brain Natriuretic Peptide: 73.1 pg/mL (ref 0.0–100.0)

## 2013-03-03 ENCOUNTER — Telehealth: Payer: Self-pay | Admitting: *Deleted

## 2013-03-03 ENCOUNTER — Telehealth: Payer: Self-pay

## 2013-03-03 MED ORDER — FUROSEMIDE 20 MG PO TABS: 20.0000 mg | ORAL_TABLET | Freq: Every day | ORAL | Status: DC

## 2013-03-03 NOTE — Telephone Encounter (Signed)
Ehrenberg wanted clarification if pt was allergic to lasix.Per her chart there is NKDA

## 2013-03-03 NOTE — Telephone Encounter (Signed)
Lasix rx entered sent to pharm

## 2013-03-03 NOTE — Telephone Encounter (Signed)
Needs Rx called in for lasix 20mg 

## 2013-03-07 ENCOUNTER — Ambulatory Visit (INDEPENDENT_AMBULATORY_CARE_PROVIDER_SITE_OTHER): Payer: Medicare PPO | Admitting: *Deleted

## 2013-03-07 DIAGNOSIS — I495 Sick sinus syndrome: Secondary | ICD-10-CM

## 2013-03-08 LAB — MDC_IDC_ENUM_SESS_TYPE_REMOTE
Battery Impedance: 1771 Ohm
Brady Statistic AP VP Percent: 9 %
Brady Statistic AS VS Percent: 75 %
Date Time Interrogation Session: 20150123180221
Lead Channel Impedance Value: 441 Ohm
Lead Channel Impedance Value: 489 Ohm
Lead Channel Pacing Threshold Amplitude: 0.625 V
Lead Channel Pacing Threshold Pulse Width: 0.4 ms
Lead Channel Sensing Intrinsic Amplitude: 2.8 mV
Lead Channel Setting Pacing Amplitude: 1.5 V
MDC IDC MSMT BATTERY REMAINING LONGEVITY: 32 mo
MDC IDC MSMT BATTERY VOLTAGE: 2.75 V
MDC IDC MSMT LEADCHNL RV PACING THRESHOLD AMPLITUDE: 0.5 V
MDC IDC MSMT LEADCHNL RV PACING THRESHOLD PULSEWIDTH: 0.4 ms
MDC IDC MSMT LEADCHNL RV SENSING INTR AMPL: 8 mV
MDC IDC SET LEADCHNL RV PACING AMPLITUDE: 2 V
MDC IDC SET LEADCHNL RV PACING PULSEWIDTH: 0.4 ms
MDC IDC SET LEADCHNL RV SENSING SENSITIVITY: 5.6 mV
MDC IDC STAT BRADY AP VS PERCENT: 12 %
MDC IDC STAT BRADY AS VP PERCENT: 5 %

## 2013-03-11 ENCOUNTER — Encounter: Payer: Self-pay | Admitting: *Deleted

## 2013-03-14 ENCOUNTER — Encounter: Payer: Self-pay | Admitting: Adult Health

## 2013-03-14 ENCOUNTER — Encounter: Payer: Self-pay | Admitting: Internal Medicine

## 2013-03-14 ENCOUNTER — Ambulatory Visit (INDEPENDENT_AMBULATORY_CARE_PROVIDER_SITE_OTHER): Payer: Medicare PPO | Admitting: Adult Health

## 2013-03-14 VITALS — BP 131/69 | HR 76 | Ht 66.0 in | Wt 191.0 lb

## 2013-03-14 DIAGNOSIS — I509 Heart failure, unspecified: Secondary | ICD-10-CM

## 2013-03-14 DIAGNOSIS — I5032 Chronic diastolic (congestive) heart failure: Secondary | ICD-10-CM

## 2013-03-14 DIAGNOSIS — R609 Edema, unspecified: Secondary | ICD-10-CM

## 2013-03-14 DIAGNOSIS — I10 Essential (primary) hypertension: Secondary | ICD-10-CM

## 2013-03-14 MED ORDER — FUROSEMIDE 20 MG PO TABS: 10.0000 mg | ORAL_TABLET | Freq: Every day | ORAL | Status: DC

## 2013-03-14 NOTE — Progress Notes (Signed)
    HPI Kari Hall is a friendly 79 year old patient we are going to establish with Dr. Pennelope Bracken, whereupon for ongoing assessment and management of symptomatic bradycardia status post Medtronic pacemaker placed in 2007, hypertension, with a history of diastolic CHF. On last visit the patient was restarted on low-dose Lasix in the setting of recurrent PND and dyspnea on exertion. The patient is feeling much better with use of Lasix and has had no further symptoms. She has had no further fluid retention in her lower extremities. She occasionally complains of some lightheadedness in the morning after she gets up. It usually passes quickly. She is doing well.  No Known Allergies  Current Outpatient Prescriptions  Medication Sig Dispense Refill  . aspirin 81 MG tablet Take 81 mg by mouth daily.        Marland Kitchen atenolol (TENORMIN) 50 MG tablet Take 50 mg by mouth daily.        . furosemide (LASIX) 20 MG tablet Take 0.5 tablets (10 mg total) by mouth daily.  90 tablet  3  . ibuprofen (ADVIL,MOTRIN) 200 MG tablet Take 200 mg by mouth as needed.      Marland Kitchen levothyroxine (SYNTHROID, LEVOTHROID) 75 MCG tablet Take 100 mcg by mouth daily.       Marland Kitchen omeprazole (PRILOSEC) 20 MG capsule Take 20 mg by mouth daily.      . potassium chloride SA (K-DUR,KLOR-CON) 20 MEQ tablet Take 20 mEq by mouth 2 (two) times daily.       . [DISCONTINUED] ezetimibe-simvastatin (VYTORIN) 10-40 MG per tablet Take 1 tablet by mouth at bedtime.         No current facility-administered medications for this visit.    Past Medical History  Diagnosis Date  . GERD (gastroesophageal reflux disease)   . Dyslipidemia   . Hypertension   . Hypothyroidism   . IBS (irritable bowel syndrome)   . Diverticulitis   . Sinoatrial node dysfunction     Past Surgical History  Procedure Laterality Date  . Cholecystectomy    . Appendectomy    . Colectomy      SECONDARY TO DIVERTICULITIS  . Ectopic pregnancy surgery    . Pacemaker placement       ROS: Review of systems complete and found to be negative unless listed above  PHYSICAL EXAM BP 131/69  Pulse 76  Ht 5\' 6"  (1.676 m)  Wt 191 lb (86.637 kg)  BMI 30.84 kg/m2  SpO2 96%  General: Well developed, well nourished, in no acute distress Head: Eyes PERRLA, No xanthomas.   Normal cephalic and atramatic  Lungs: Clear bilaterally to auscultation and percussion. Heart: HRRR S1 S2, without MRG.  Pulses are 2+ & equal.            No carotid bruit. No JVD.  No abdominal bruits. No femoral bruits. Abdomen: Bowel sounds are positive, abdomen soft and non-tender without masses or                  Hernia's noted. Msk:  Back normal, normal gait. Normal strength and tone for age. Extremities: No clubbing, cyanosis or edema.  DP +1 Neuro: Alert and oriented X 3. Psych:  Good affect, responds appropriately    ASSESSMENT AND PLAN

## 2013-03-14 NOTE — Patient Instructions (Signed)
Your physician recommends that you schedule a follow-up appointment in: 6 months with Dr Virgina Jock will receive a reminder letter two months in advance reminding you to call and schedule your appointment. If you don't receive this letter, please contact our office.  Your physician has recommended you make the following change in your medication:  Take 1/2 tablet of Lasix in the evening.

## 2013-03-14 NOTE — Assessment & Plan Note (Signed)
This has resolved.

## 2013-03-14 NOTE — Progress Notes (Deleted)
Name: Kari Hall    DOB: 1934/02/26  Age: 79 y.o.  MR#: [id number]       PCP:  Glo Herring., MD      Insurance: Payor: [employer] MEDICARE / Plan: [employer] MEDICARE CHOICE PPO / Product Type: *No Product type* /   CC:   No chief complaint on file.   VS Filed Vitals:   03/14/13 1303  BP: 131/69  Pulse: 76  Height: $Remove'5\' 6"'[id number]$  (1.676 m)  Weight: 191 lb (86.637 kg)  SpO2: 96%    Weights Current Weight  03/14/13 191 lb (86.637 kg)  02/28/13 193 lb (87.544 kg)  05/06/12 190 lb 6.4 oz (86.365 kg)    Blood Pressure  BP Readings from Last 3 Encounters:  03/14/13 131/69  02/28/13 150/81  05/06/12 102/80     Admit date:  (Not on file) Last encounter with RMR:  02/28/2013   Allergy Review of patient's allergies indicates no known allergies.  Current Outpatient Prescriptions  Medication Sig Dispense Refill  . aspirin 81 MG tablet Take 81 mg by mouth daily.        Marland Kitchen atenolol (TENORMIN) 50 MG tablet Take 50 mg by mouth daily.        . furosemide (LASIX) 20 MG tablet Take 1 tablet (20 mg total) by mouth daily.  90 tablet  3  . ibuprofen (ADVIL,MOTRIN) 200 MG tablet Take 200 mg by mouth as needed.      Marland Kitchen levothyroxine (SYNTHROID, LEVOTHROID) 75 MCG tablet Take 100 mcg by mouth daily.       Marland Kitchen omeprazole (PRILOSEC) 20 MG capsule Take 20 mg by mouth daily.      . potassium chloride SA (K-DUR,KLOR-CON) 20 MEQ tablet Take 20 mEq by mouth 2 (two) times daily.       . [DISCONTINUED] ezetimibe-simvastatin (VYTORIN) 10-40 MG per tablet Take 1 tablet by mouth at bedtime.         No current facility-administered medications for this visit.    Discontinued Meds:   There are no discontinued medications.  Patient Active Problem List   Diagnosis Date Noted  . Chronic diastolic congestive heart failure 05/04/2011  . Sinoatrial node dysfunction   . Edema 08/24/2010  . IBS 03/15/2010  . EPIGASTRIC PAIN 03/15/2010  . CARDIAC PACEMAKER IN SITU 06/09/2009  . HYPOTHYROIDISM 06/02/2009  . DYSLIPIDEMIA  06/02/2009  . TOBACCO ABUSE 06/02/2009  . HYPERTENSION 06/02/2009    LABS    Component Value Date/Time   NA 140 02/28/2013 0855   NA 143 07/02/2011 1150   NA 144 05/04/2011 1000   K 4.1 02/28/2013 0855   K 2.8* 07/02/2011 1150   K 3.6 05/04/2011 1000   CL 104 02/28/2013 0855   CL 103 07/02/2011 1150   CL 103 05/04/2011 1000   CO2 30 02/28/2013 0855   CO2 28 07/02/2011 1150   CO2 29 05/04/2011 1000   GLUCOSE 123* 02/28/2013 0855   GLUCOSE 122* 07/02/2011 1150   GLUCOSE 89 05/04/2011 1000   BUN 19 02/28/2013 0855   BUN 19 07/02/2011 1150   BUN 20 05/04/2011 1000   CREATININE 1.06 02/28/2013 0855   CREATININE 0.97 07/02/2011 1150   CREATININE 1.03 05/04/2011 1000   CREATININE 0.97 08/24/2010 1344   CREATININE 0.97 03/17/2010 2313   CREATININE 1.89* 10/04/2008 1320   CALCIUM 9.3 02/28/2013 0855   CALCIUM 8.6 07/02/2011 1150   CALCIUM 8.9 05/04/2011 1000   GFRNONAA 56* 07/02/2011 1150   GFRNONAA 26* 10/04/2008 1320   GFRNONAA 21*  10/04/2008 1025   GFRAA 65* 07/02/2011 1150   GFRAA  Value: 32        The eGFR has been calculated using the MDRD equation. This calculation has not been validated in all clinical situations. eGFR's persistently <60 mL/min signify possible Chronic Kidney Disease.* 10/04/2008 1320   GFRAA  Value: 25        The eGFR has been calculated using the MDRD equation. This calculation has not been validated in all clinical situations. eGFR's persistently <60 mL/min signify possible Chronic Kidney Disease.* 10/04/2008 1025   CMP     Component Value Date/Time   NA 140 02/28/2013 0855   K 4.1 02/28/2013 0855   CL 104 02/28/2013 0855   CO2 30 02/28/2013 0855   GLUCOSE 123* 02/28/2013 0855   BUN 19 02/28/2013 0855   CREATININE 1.06 02/28/2013 0855   CREATININE 0.97 07/02/2011 1150   CALCIUM 9.3 02/28/2013 0855   PROT 7.3 07/02/2011 1150   ALBUMIN 3.8 07/02/2011 1150   AST 17 07/02/2011 1150   ALT 13 07/02/2011 1150   ALKPHOS 91 07/02/2011 1150   BILITOT 0.5 07/02/2011 1150   GFRNONAA 56* 07/02/2011  1150   GFRAA 65* 07/02/2011 1150       Component Value Date/Time   WBC 5.3 02/28/2013 0855   WBC 6.4 07/02/2011 1150   WBC 4.6 03/17/2010 2313   HGB 14.2 02/28/2013 0855   HGB 13.4 07/02/2011 1150   HGB 13.1 03/17/2010 2313   HCT 41.5 02/28/2013 0855   HCT 38.5 07/02/2011 1150   HCT 39.9 03/17/2010 2313   MCV 91.6 02/28/2013 0855   MCV 91.0 07/02/2011 1150   MCV 93.9 03/17/2010 2313    Lipid Panel  No results found for this basename: chol, trig, hdl, cholhdl, vldl, ldlcalc    ABG No results found for this basename: phart, pco2, pco2art, po2, po2art, hco3, tco2, acidbasedef, o2sat     Lab Results  Component Value Date   TSH 1.369 08/24/2010   BNP (last 3 results) No results found for this basename: PROBNP,  in the last 8760 hours Cardiac Panel (last 3 results) No results found for this basename: CKTOTAL, CKMB, TROPONINI, RELINDX,  in the last 72 hours  Iron/TIBC/Ferritin No results found for this basename: iron, tibc, ferritin     EKG Orders placed in visit on 05/13/12  . EKG 12-LEAD     Prior Assessment and Plan Problem List as of 03/14/2013     Cardiovascular and Mediastinum   HYPERTENSION   Last Assessment & Plan   02/28/2013 Office Visit Written 02/28/2013  3:39 PM by Lendon Colonel, NP     Blood pressure is elevated. She is taking medications as directed. There is some evidence of fluid retention and 5 lbs wt gain. Will see her in 2 weeks.    Sinoatrial node dysfunction   Chronic diastolic congestive heart failure   Last Assessment & Plan   04/08/2012 Office Visit Written 04/08/2012  1:18 PM by Lendon Colonel, NP     NO evidence of fluid overload or decompensation. No symptoms to suggest this.  Continue current medications.      Digestive   IBS     Endocrine   HYPOTHYROIDISM   Last Assessment & Plan   09/08/2010 Office Visit Written 09/08/2010 11:25 AM by Lendon Colonel, NP     She is followed by Larene Pickett for this. Labs have been recently drawn, she states  that she was told they were normal.  Other   Edema   Last Assessment & Plan   02/28/2013 Office Visit Written 02/28/2013  3:38 PM by Lendon Colonel, NP     I have given her lasix 20 mg po daily. She is to have Pro-BNP, CXR, BMET, and CBC today.     DYSLIPIDEMIA   Last Assessment & Plan   04/08/2012 Office Visit Written 04/08/2012  1:19 PM by Lendon Colonel, NP     Will request recent labs from Dr. Gerarda Fraction for our records.    TOBACCO ABUSE   CARDIAC PACEMAKER IN SITU   Last Assessment & Plan   02/28/2013 Office Visit Written 02/28/2013  3:38 PM by Lendon Colonel, NP     She is due for [facility] check on the 23rd remote. She is due for in person pacemaker check in March.     EPIGASTRIC PAIN       Imaging: No results found.

## 2013-03-14 NOTE — Assessment & Plan Note (Signed)
Excellent control of blood pressure. She denies any headaches. We will continue current medication regimen at this time.

## 2013-03-14 NOTE — Assessment & Plan Note (Signed)
There is no evidence of fluid retention. Breathing status has improved. She denies any PND or orthopnea and she was experiencing in the past. She does have some morning lightheadedness which passes as the day progresses. I have advised her to maybe start taking a half a tablet of the Lasix, 10 mg or skipped a dose altogether and only used when necessary now if she begins to be lightheaded. She is so happy with the results of daily Lasix if she would like to try taking half a tablet to see if the symptoms improve the next morning. I have advised her that we can always adjust the Lasix to every other day or as needed should symptoms persist. She is going to call us to let us know how things are going with medication changes.

## 2013-04-16 ENCOUNTER — Telehealth: Payer: Self-pay | Admitting: *Deleted

## 2013-04-16 NOTE — Telephone Encounter (Signed)
Continue with recent note......Marland KitchenTold pt she needs to go to the ER to get oxygen and get evaluated. This nurse asked pt if she had someone that would take her and she said her daughter could.

## 2013-04-16 NOTE — Telephone Encounter (Signed)
Pt calls and states she can not catch her breath. Spoke to pt and asked if she had oxygen. Pt does not. SOB has been going on for an hour. No complaints of wheezing or coughing, chest pain, or fluid retention. Told pt she needs to go to the ER to get oxygen and get

## 2013-05-02 ENCOUNTER — Ambulatory Visit (INDEPENDENT_AMBULATORY_CARE_PROVIDER_SITE_OTHER): Payer: Medicare PPO | Admitting: Internal Medicine

## 2013-05-02 ENCOUNTER — Encounter: Payer: Self-pay | Admitting: Internal Medicine

## 2013-05-02 VITALS — BP 145/53 | HR 61 | Ht 65.0 in | Wt 194.8 lb

## 2013-05-02 DIAGNOSIS — I509 Heart failure, unspecified: Secondary | ICD-10-CM

## 2013-05-02 DIAGNOSIS — I495 Sick sinus syndrome: Secondary | ICD-10-CM

## 2013-05-02 DIAGNOSIS — Z950 Presence of cardiac pacemaker: Secondary | ICD-10-CM

## 2013-05-02 DIAGNOSIS — I5032 Chronic diastolic (congestive) heart failure: Secondary | ICD-10-CM

## 2013-05-02 LAB — MDC_IDC_ENUM_SESS_TYPE_INCLINIC
Brady Statistic AS VS Percent: 77 %
Lead Channel Impedance Value: 434 Ohm
Lead Channel Impedance Value: 476 Ohm
Lead Channel Pacing Threshold Pulse Width: 0.4 ms
Lead Channel Sensing Intrinsic Amplitude: 15.67 mV
Lead Channel Setting Pacing Amplitude: 2 V
Lead Channel Setting Sensing Sensitivity: 5.6 mV
MDC IDC MSMT BATTERY IMPEDANCE: 1806 Ohm
MDC IDC MSMT BATTERY REMAINING LONGEVITY: 31 mo
MDC IDC MSMT BATTERY VOLTAGE: 2.74 V
MDC IDC MSMT LEADCHNL RA PACING THRESHOLD AMPLITUDE: 0.5 V
MDC IDC MSMT LEADCHNL RA PACING THRESHOLD PULSEWIDTH: 0.4 ms
MDC IDC MSMT LEADCHNL RA SENSING INTR AMPL: 4 mV
MDC IDC MSMT LEADCHNL RV PACING THRESHOLD AMPLITUDE: 0.5 V
MDC IDC SESS DTM: 20150320091220
MDC IDC SET LEADCHNL RV PACING AMPLITUDE: 2.5 V
MDC IDC SET LEADCHNL RV PACING PULSEWIDTH: 0.4 ms
MDC IDC STAT BRADY AP VP PERCENT: 8 %
MDC IDC STAT BRADY AP VS PERCENT: 11 %
MDC IDC STAT BRADY AS VP PERCENT: 4 %

## 2013-05-02 NOTE — Assessment & Plan Note (Signed)
Her symptoms are class 2. I have asked the patient to reduce her sodium intake and increase her physical activity.

## 2013-05-02 NOTE — Patient Instructions (Addendum)
Your physician recommends that you schedule a follow-up appointment in: 12 months with Dr Knox Saliva will receive a reminder letter two months in advance reminding you to call and schedule your appointment. If you don't receive this letter, please contact our office.  Remote monitoring is used to monitor your Pacemaker or ICD from home. This monitoring reduces the number of office visits required to check your device to one time per year. It allows [place] to keep an eye on the functioning of your device to ensure it is working properly. You are scheduled for a device check from home on 08/05/13. You may send your transmission at any time that day. If you have a wireless device, the transmission will be sent automatically. After your physician reviews your transmission, you will receive a postcard with your next transmission date.

## 2013-05-02 NOTE — Assessment & Plan Note (Signed)
Her Medtronic DDD PM is working normally. Will recheck in several months. 

## 2013-05-02 NOTE — Progress Notes (Signed)
HPI Kari Hall returns today for followup. She is a very pleasant 79 year old woman, with a history of symptomatic bradycardia, status post permanent pacemaker insertion. She also is a history of hypertension. In the interim, she has done well. She denies chest pain or shortness of breath. She does admit to some dietary indiscretion, and has gained weight. She has stopped eating supper. She exercises several times a week. No Known Allergies   Current Outpatient Prescriptions  Medication Sig Dispense Refill  . aspirin 81 MG tablet Take 81 mg by mouth daily.        Marland Kitchen atenolol (TENORMIN) 50 MG tablet Take 50 mg by mouth daily.        . furosemide (LASIX) 20 MG tablet Take 0.5 tablets (10 mg total) by mouth daily.  90 tablet  3  . ibuprofen (ADVIL,MOTRIN) 200 MG tablet Take 200 mg by mouth as needed.      Marland Kitchen levothyroxine (SYNTHROID, LEVOTHROID) 100 MCG tablet Take 100 mcg by mouth daily before breakfast.      . omeprazole (PRILOSEC) 20 MG capsule Take 20 mg by mouth daily.      . potassium chloride SA (K-DUR,KLOR-CON) 20 MEQ tablet Take 20 mEq by mouth daily.       . [DISCONTINUED] ezetimibe-simvastatin (VYTORIN) 10-40 MG per tablet Take 1 tablet by mouth at bedtime.         No current facility-administered medications for this visit.     Past Medical History  Diagnosis Date  . GERD (gastroesophageal reflux disease)   . Dyslipidemia   . Hypertension   . Hypothyroidism   . IBS (irritable bowel syndrome)   . Diverticulitis   . Sinoatrial node dysfunction     ROS:   All systems reviewed and negative except as noted in the HPI.   Past Surgical History  Procedure Laterality Date  . Cholecystectomy    . Appendectomy    . Colectomy      SECONDARY TO DIVERTICULITIS  . Ectopic pregnancy surgery    . Pacemaker placement       No family history on file.   History   Social History  . Marital Status: Divorced    Spouse Name: N/A    Number of Children: N/A  . Years of Education:  N/A   Occupational History  . Not on file.   Social History Main Topics  . Smoking status: Former Smoker    Types: Cigarettes    Quit date: 02/24/1979  . Smokeless tobacco: Not on file  . Alcohol Use: No  . Drug Use: No  . Sexual Activity: No   Other Topics Concern  . Not on file   Social History Narrative  . No narrative on file     BP 145/53  Pulse 61  Ht 5\' 5"  (1.651 m)  Wt 194 lb 12.8 oz (88.361 kg)  BMI 32.42 kg/m2  SpO2 95%  Physical Exam:  Well appearing 79 year old woman,NAD HEENT: Unremarkable Neck:  6 cm JVD, no thyromegally Lungs:  Clear with no wheezes, rales, or rhonchi. Well-healed pacemaker incision. HEART:  Regular rate rhythm, no murmurs, no rubs, no clicks Abd:  soft, positive bowel sounds, no organomegally, no rebound, no guarding Ext:  2 plus pulses, no edema, no cyanosis, no clubbing Skin:  No rashes no nodules Neuro:  CN II through XII intact, motor grossly intact   DEVICE  Normal device function.  See PaceArt for details.   Assess/Plan:

## 2013-06-02 ENCOUNTER — Telehealth: Payer: Self-pay

## 2013-06-02 NOTE — Telephone Encounter (Signed)
Patient with c/o severe SOB,onset just prior to call.No one is there with patient.I noted she was barely able to speak over phone.I advised her to hang up and call 911.She stated she would do so

## 2013-06-09 ENCOUNTER — Emergency Department (HOSPITAL_COMMUNITY): Payer: Medicare PPO

## 2013-06-09 ENCOUNTER — Emergency Department (HOSPITAL_COMMUNITY)
Admission: EM | Admit: 2013-06-09 | Discharge: 2013-06-09 | Disposition: A | Discharge status: Home / Self Care | Payer: Medicare PPO | Attending: Emergency Medicine | Admitting: Emergency Medicine

## 2013-06-09 ENCOUNTER — Encounter (HOSPITAL_COMMUNITY): Payer: Self-pay | Admitting: Emergency Medicine

## 2013-06-09 DIAGNOSIS — E039 Hypothyroidism, unspecified: Secondary | ICD-10-CM | POA: Insufficient documentation

## 2013-06-09 DIAGNOSIS — Z87891 Personal history of nicotine dependence: Secondary | ICD-10-CM | POA: Insufficient documentation

## 2013-06-09 DIAGNOSIS — Z7982 Long term (current) use of aspirin: Secondary | ICD-10-CM | POA: Insufficient documentation

## 2013-06-09 DIAGNOSIS — R5383 Other fatigue: Principal | ICD-10-CM

## 2013-06-09 DIAGNOSIS — R0602 Shortness of breath: Secondary | ICD-10-CM | POA: Insufficient documentation

## 2013-06-09 DIAGNOSIS — R609 Edema, unspecified: Secondary | ICD-10-CM | POA: Insufficient documentation

## 2013-06-09 DIAGNOSIS — R110 Nausea: Secondary | ICD-10-CM | POA: Insufficient documentation

## 2013-06-09 DIAGNOSIS — R42 Dizziness and giddiness: Secondary | ICD-10-CM | POA: Insufficient documentation

## 2013-06-09 DIAGNOSIS — Z950 Presence of cardiac pacemaker: Secondary | ICD-10-CM | POA: Insufficient documentation

## 2013-06-09 DIAGNOSIS — R5381 Other malaise: Secondary | ICD-10-CM | POA: Insufficient documentation

## 2013-06-09 DIAGNOSIS — Z79899 Other long term (current) drug therapy: Secondary | ICD-10-CM | POA: Insufficient documentation

## 2013-06-09 DIAGNOSIS — I10 Essential (primary) hypertension: Secondary | ICD-10-CM | POA: Insufficient documentation

## 2013-06-09 DIAGNOSIS — M542 Cervicalgia: Secondary | ICD-10-CM | POA: Insufficient documentation

## 2013-06-09 DIAGNOSIS — R531 Weakness: Secondary | ICD-10-CM

## 2013-06-09 DIAGNOSIS — K219 Gastro-esophageal reflux disease without esophagitis: Secondary | ICD-10-CM | POA: Insufficient documentation

## 2013-06-09 LAB — URINALYSIS, ROUTINE W REFLEX MICROSCOPIC
BILIRUBIN URINE: NEGATIVE
Glucose, UA: NEGATIVE mg/dL
HGB URINE DIPSTICK: NEGATIVE
KETONES UR: NEGATIVE mg/dL
Nitrite: NEGATIVE
PH: 6 (ref 5.0–8.0)
PROTEIN: NEGATIVE mg/dL
Specific Gravity, Urine: 1.015 (ref 1.005–1.030)
Urobilinogen, UA: 0.2 mg/dL (ref 0.0–1.0)

## 2013-06-09 LAB — URINE MICROSCOPIC-ADD ON

## 2013-06-09 LAB — CBC WITH DIFFERENTIAL/PLATELET
Basophils Absolute: 0 10*3/uL (ref 0.0–0.1)
Basophils Relative: 0 % (ref 0–1)
Eosinophils Absolute: 0.1 10*3/uL (ref 0.0–0.7)
Eosinophils Relative: 2 % (ref 0–5)
HCT: 40.9 % (ref 36.0–46.0)
HEMOGLOBIN: 13.8 g/dL (ref 12.0–15.0)
Lymphocytes Relative: 22 % (ref 12–46)
Lymphs Abs: 1.3 10*3/uL (ref 0.7–4.0)
MCH: 30.9 pg (ref 26.0–34.0)
MCHC: 33.7 g/dL (ref 30.0–36.0)
MCV: 91.7 fL (ref 78.0–100.0)
MONOS PCT: 14 % — AB (ref 3–12)
Monocytes Absolute: 0.8 10*3/uL (ref 0.1–1.0)
NEUTROS ABS: 3.7 10*3/uL (ref 1.7–7.7)
NEUTROS PCT: 62 % (ref 43–77)
PLATELETS: 144 10*3/uL — AB (ref 150–400)
RBC: 4.46 MIL/uL (ref 3.87–5.11)
RDW: 12.6 % (ref 11.5–15.5)
WBC: 6 10*3/uL (ref 4.0–10.5)

## 2013-06-09 LAB — BASIC METABOLIC PANEL
BUN: 27 mg/dL — ABNORMAL HIGH (ref 6–23)
CHLORIDE: 102 meq/L (ref 96–112)
CO2: 26 mEq/L (ref 19–32)
Calcium: 9.4 mg/dL (ref 8.4–10.5)
Creatinine, Ser: 1.1 mg/dL (ref 0.50–1.10)
GFR calc Af Amer: 55 mL/min — ABNORMAL LOW (ref >90)
GFR, EST NON AFRICAN AMERICAN: 47 mL/min — AB (ref >90)
Glucose, Bld: 96 mg/dL (ref 70–99)
POTASSIUM: 4.7 meq/L (ref 3.7–5.3)
Sodium: 141 mEq/L (ref 137–147)

## 2013-06-09 LAB — HEPATIC FUNCTION PANEL
ALT: 13 U/L (ref 0–35)
AST: 20 U/L (ref 0–37)
Albumin: 3.7 g/dL (ref 3.5–5.2)
Alkaline Phosphatase: 116 U/L (ref 39–117)
Bilirubin, Direct: <0.2 mg/dL (ref 0.0–0.3)
TOTAL PROTEIN: 7.2 g/dL (ref 6.0–8.3)
Total Bilirubin: 0.7 mg/dL (ref 0.3–1.2)

## 2013-06-09 LAB — TSH: TSH: 0.352 u[IU]/mL (ref 0.350–4.500)

## 2013-06-09 LAB — LIPASE, BLOOD: Lipase: 44 U/L (ref 11–59)

## 2013-06-09 LAB — TROPONIN I

## 2013-06-09 MED ORDER — ONDANSETRON 4 MG PO TBDP: 4.0000 mg | ORAL_TABLET | Freq: Three times a day (TID) | ORAL | Status: DC | PRN

## 2013-06-09 NOTE — Discharge Instructions (Signed)
Fatigue °Fatigue is a feeling of tiredness, lack of energy, lack of motivation, or feeling tired all the time. Having enough rest, good nutrition, and reducing stress will normally reduce fatigue. Consult your caregiver if it persists. The nature of your fatigue will help your caregiver to find out its cause. The treatment is based on the cause.  °CAUSES  °There are many causes for fatigue. Most of the time, fatigue can be traced to one or more of your habits or routines. Most causes fit into one or more of three general areas. They are: °Lifestyle problems °· Sleep disturbances. °· Overwork. °· Physical exertion. °· Unhealthy habits. °· Poor eating habits or eating disorders. °· Alcohol and/or drug use . °· Lack of proper nutrition (malnutrition). °Psychological problems °· Stress and/or anxiety problems. °· Depression. °· Grief. °· Boredom. °Medical Problems or Conditions °· Anemia. °· Pregnancy. °· Thyroid gland problems. °· Recovery from major surgery. °· Continuous pain. °· Emphysema or asthma that is not well controlled °· Allergic conditions. °· Diabetes. °· Infections (such as mononucleosis). °· Obesity. °· Sleep disorders, such as sleep apnea. °· Heart failure or other heart-related problems. °· Cancer. °· Kidney disease. °· Liver disease. °· Effects of certain medicines such as antihistamines, cough and cold remedies, prescription pain medicines, heart and blood pressure medicines, drugs used for treatment of cancer, and some antidepressants. °SYMPTOMS  °The symptoms of fatigue include:  °· Lack of energy. °· Lack of drive (motivation). °· Drowsiness. °· Feeling of indifference to the surroundings. °DIAGNOSIS  °The details of how you feel help guide your caregiver in finding out what is causing the fatigue. You will be asked about your present and past health condition. It is important to review all medicines that you take, including prescription and non-prescription items. A thorough exam will be done.  You will be questioned about your feelings, habits, and normal lifestyle. Your caregiver may suggest blood tests, urine tests, or other tests to look for common medical causes of fatigue.  °TREATMENT  °Fatigue is treated by correcting the underlying cause. For example, if you have continuous pain or depression, treating these causes will improve how you feel. Similarly, adjusting the dose of certain medicines will help in reducing fatigue.  °HOME CARE INSTRUCTIONS  °· Try to get the required amount of good sleep every night. °· Eat a healthy and nutritious diet, and drink enough water throughout the day. °· Practice ways of relaxing (including yoga or meditation). °· Exercise regularly. °· Make plans to change situations that cause stress. Act on those plans so that stresses decrease over time. Keep your work and personal routine reasonable. °· Avoid street drugs and minimize use of alcohol. °· Start taking a daily multivitamin after consulting your caregiver. °SEEK MEDICAL CARE IF:  °· You have persistent tiredness, which cannot be accounted for. °· You have fever. °· You have unintentional weight loss. °· You have headaches. °· You have disturbed sleep throughout the night. °· You are feeling sad. °· You have constipation. °· You have dry skin. °· You have gained weight. °· You are taking any new or different medicines that you suspect are causing fatigue. °· You are unable to sleep at night. °· You develop any unusual swelling of your legs or other parts of your body. °SEEK IMMEDIATE MEDICAL CARE IF:  °· You are feeling confused. °· Your vision is blurred. °· You feel faint or pass out. °· You develop severe headache. °· You develop severe abdominal, pelvic, or   back pain.  You develop chest pain, shortness of breath, or an irregular or fast heartbeat.  You are unable to pass a normal amount of urine.  You develop abnormal bleeding such as bleeding from the rectum or you vomit blood.  You have thoughts  about harming yourself or committing suicide.  You are worried that you might harm someone else. MAKE SURE YOU:   Understand these instructions.  Will watch your condition.  Will get help right away if you are not doing well or get worse. Document Released: 11/27/2006 Document Revised: 04/24/2011 Document Reviewed: 11/27/2006 [facility] Patient Information 2014 [place].  Monitoring of your pacemaker was fine he shows that he has a good 2 years left on the battery life but no arrhythmias. Followup with your regular Dr. and your cardiologist in the next few days. Workup here was negative however you're having symptoms and we don't know exactly how to explain them at this time. As I mentioned your thyroid-stimulating hormone was ordered and is not back to reck her doctors can followup on that lab. Return for any new or worse symptoms.

## 2013-06-09 NOTE — ED Notes (Signed)
PT c/o fatigue with weakness for 1 week. PT states she had to get blood last time she felt like this.

## 2013-06-09 NOTE — ED Provider Notes (Signed)
CSN: [id number]     Arrival date & time 06/09/13  1020 History  This chart was scribed for Mervin Kung, MD by Allena Earing, ED Scribe. This patient was seen in room APA14/APA14 and the patient's care was started at 12:14 PM .    Chief Complaint  Patient presents with  . Fatigue     Patient is a 79 y.o. female presenting with weakness. The history is provided by the patient. No language interpreter was used.  Weakness This is a recurrent problem. The current episode started more than 1 week ago. The problem occurs daily. The problem has not changed since onset.Associated symptoms include shortness of breath (when laying down). Pertinent negatives include no chest pain, no abdominal pain and no headaches. The symptoms are aggravated by exertion. Nothing relieves the symptoms. She has tried nothing for the symptoms.    HPI Comments: Kari Hall is a 79 y.o. female who presents to the Emergency Department complaining of worsening fatigue, her PCP told her to come into the ED. She has been to her PCP c/o weakness and nausea in the past week and was told to come to the ED as it had worsened today. Pt reports that her symptoms were worse when she woke up this morning. She also states that she was not able to eat breakfast this morning due to her "feeling unwell". Pt has had similar episode in the past that did not last as long. Pt states that she feels better currently.   Past Medical History  Diagnosis Date  . GERD (gastroesophageal reflux disease)   . Dyslipidemia   . Hypertension   . Hypothyroidism   . IBS (irritable bowel syndrome)   . Diverticulitis   . Sinoatrial node dysfunction    Past Surgical History  Procedure Laterality Date  . Cholecystectomy    . Appendectomy    . Colectomy      SECONDARY TO DIVERTICULITIS  . Ectopic pregnancy surgery    . Pacemaker placement     No family history on file. History  Substance Use Topics  . Smoking status: Former Smoker     Types: Cigarettes    Quit date: 02/24/1979  . Smokeless tobacco: Not on file  . Alcohol Use: No   OB History   Grav Para Term Preterm Abortions TAB SAB Ect Mult Living                 Review of Systems  Constitutional: Negative for fever and chills.  HENT: Negative for rhinorrhea, sinus pressure, sneezing and sore throat.   Eyes: Negative for visual disturbance.  Respiratory: Positive for shortness of breath (when laying down). Negative for cough.   Cardiovascular: Negative for chest pain and palpitations.  Gastrointestinal: Negative for nausea, vomiting, abdominal pain and diarrhea.  Genitourinary: Negative for dysuria.  Musculoskeletal: Positive for neck pain (from work). Negative for back pain.       Leg swelling   Neurological: Positive for dizziness (since she started get sick, she feels off balance) and weakness. Negative for headaches.  Hematological: Does not bruise/bleed easily.  Psychiatric/Behavioral: Negative for confusion.      Allergies  Review of patient's allergies indicates no known allergies.  Home Medications   Prior to Admission medications   Medication Sig Start Date End Date Taking? Authorizing Provider  aspirin 81 MG tablet Take 81 mg by mouth daily.     Yes Historical Provider, MD  atenolol (TENORMIN) 50 MG tablet Take 50 mg by mouth  daily.     Yes Historical Provider, MD  furosemide (LASIX) 20 MG tablet Take 20 mg by mouth at bedtime. 03/14/13  Yes Lendon Colonel, NP  levothyroxine (SYNTHROID, LEVOTHROID) 100 MCG tablet Take 100 mcg by mouth daily before breakfast.   Yes Historical Provider, MD  omeprazole (PRILOSEC) 20 MG capsule Take 20 mg by mouth daily.   Yes Historical Provider, MD  potassium chloride SA (K-DUR,KLOR-CON) 20 MEQ tablet Take 40 mEq by mouth daily.    Yes Historical Provider, MD   BP 142/92  Pulse 69  Temp(Src) 97.9 F (36.6 C) (Oral)  Resp 18  Ht 5\' 6"  (1.676 m)  Wt 190 lb (86.183 kg)  BMI 30.68 kg/m2  SpO2  96% Physical Exam  Nursing note and vitals reviewed. Constitutional: She is oriented to person, place, and time. She appears well-developed and well-nourished.  HENT:  Head: Normocephalic and atraumatic.  Neck: Normal range of motion.  Cardiovascular: Normal rate, regular rhythm and normal heart sounds.   No murmur heard. Pt has pacemaker   Pulmonary/Chest: Effort normal and breath sounds normal.  Abdominal: Soft. Bowel sounds are normal. There is no tenderness.  Musculoskeletal: Normal range of motion. She exhibits edema (minor, bilaterally).  Neurological: She is alert and oriented to person, place, and time. No cranial nerve deficit. Coordination normal.    ED Course  Procedures (including critical care time)  DIAGNOSTIC STUDIES: Oxygen Saturation is 96% on RA, normal by my interpretation.    COORDINATION OF CARE:   12:25 PM-Discussed treatment plan which includes EKG,UA, labswith pt at bedside and pt agreed to plan.    Labs Review Results for orders placed during the hospital encounter of 06/09/13  URINALYSIS, ROUTINE W REFLEX MICROSCOPIC      Result Value Ref Range   Color, Urine YELLOW  YELLOW   APPearance CLEAR  CLEAR   Specific Gravity, Urine 1.015  1.005 - 1.030   pH 6.0  5.0 - 8.0   Glucose, UA NEGATIVE  NEGATIVE mg/dL   Hgb urine dipstick NEGATIVE  NEGATIVE   Bilirubin Urine NEGATIVE  NEGATIVE   Ketones, ur NEGATIVE  NEGATIVE mg/dL   Protein, ur NEGATIVE  NEGATIVE mg/dL   Urobilinogen, UA 0.2  0.0 - 1.0 mg/dL   Nitrite NEGATIVE  NEGATIVE   Leukocytes, UA SMALL (*) NEGATIVE  CBC WITH DIFFERENTIAL      Result Value Ref Range   WBC 6.0  4.0 - 10.5 K/uL   RBC 4.46  3.87 - 5.11 MIL/uL   Hemoglobin 13.8  12.0 - 15.0 g/dL   HCT 40.9  36.0 - 46.0 %   MCV 91.7  78.0 - 100.0 fL   MCH 30.9  26.0 - 34.0 pg   MCHC 33.7  30.0 - 36.0 g/dL   RDW 12.6  11.5 - 15.5 %   Platelets 144 (*) 150 - 400 K/uL   Neutrophils Relative % 62  43 - 77 %   Neutro Abs 3.7  1.7 - 7.7  K/uL   Lymphocytes Relative 22  12 - 46 %   Lymphs Abs 1.3  0.7 - 4.0 K/uL   Monocytes Relative 14 (*) 3 - 12 %   Monocytes Absolute 0.8  0.1 - 1.0 K/uL   Eosinophils Relative 2  0 - 5 %   Eosinophils Absolute 0.1  0.0 - 0.7 K/uL   Basophils Relative 0  0 - 1 %   Basophils Absolute 0.0  0.0 - 0.1 K/uL  BASIC METABOLIC PANEL  Result Value Ref Range   Sodium 141  137 - 147 mEq/L   Potassium 4.7  3.7 - 5.3 mEq/L   Chloride 102  96 - 112 mEq/L   CO2 26  19 - 32 mEq/L   Glucose, Bld 96  70 - 99 mg/dL   BUN 27 (*) 6 - 23 mg/dL   Creatinine, Ser 1.10  0.50 - 1.10 mg/dL   Calcium 9.4  8.4 - 10.5 mg/dL   GFR calc non Af Amer 47 (*) >90 mL/min   GFR calc Af Amer 55 (*) >90 mL/min  URINE MICROSCOPIC-ADD ON      Result Value Ref Range   Squamous Epithelial / LPF FEW (*) RARE   WBC, UA 3-6  <3 WBC/hpf   Bacteria, UA FEW (*) RARE  HEPATIC FUNCTION PANEL      Result Value Ref Range   Total Protein 7.2  6.0 - 8.3 g/dL   Albumin 3.7  3.5 - 5.2 g/dL   AST 20  0 - 37 U/L   ALT 13  0 - 35 U/L   Alkaline Phosphatase 116  39 - 117 U/L   Total Bilirubin 0.7  0.3 - 1.2 mg/dL   Bilirubin, Direct <0.2  0.0 - 0.3 mg/dL   Indirect Bilirubin NOT CALCULATED  0.3 - 0.9 mg/dL  LIPASE, BLOOD      Result Value Ref Range   Lipase 44  11 - 59 U/L  TROPONIN I      Result Value Ref Range   Troponin I <0.30  <0.30 ng/mL   No results found.  Medications - No data to display   Imaging Review Dg Chest 2 View  06/09/2013   CLINICAL DATA:  Nausea and fatigue  EXAM: CHEST  2 VIEW  COMPARISON:  April 08, 2012  FINDINGS: There is evidence of a degree of emphysematous change. There is no edema or consolidation. Heart is upper normal in size. Pulmonary vascularity reflects underlying emphysema. Pacemaker leads are attached to the right atrium and right ventricle. No pneumothorax. There is degenerative change in the thoracic spine.  IMPRESSION: Underlying emphysematous change.  No edema or consolidation.    Electronically Signed   By: Lowella Grip M.D.   On: 06/09/2013 13:11   Ct Head Wo Contrast  06/09/2013   CLINICAL DATA:  Generalized weakness  EXAM: CT HEAD WITHOUT CONTRAST  TECHNIQUE: Contiguous axial images were obtained from the base of the skull through the vertex without intravenous contrast.  COMPARISON:  CT HEAD W/O CM dated 07/02/2011  FINDINGS: There is no evidence of mass effect, midline shift, or extra-axial fluid collections. There is no evidence of a space-occupying lesion or intracranial hemorrhage. There is no evidence of a cortical-based area of acute infarction. There is generalized cerebral atrophy. There is periventricular white matter low attenuation likely secondary to microangiopathy.  The ventricles and sulci are appropriate for the patient's age. The basal cisterns are patent.  Visualized portions of the orbits are unremarkable. The visualized portions of the paranasal sinuses and mastoid air cells are unremarkable. Cerebrovascular atherosclerotic calcifications are noted.  The osseous structures are unremarkable.  IMPRESSION: No acute intracranial pathology.   Electronically Signed   By: Kathreen Devoid   On: 06/09/2013 13:23     EKG Interpretation   Date/Time:  Monday June 09 2013 10:58:54 EDT Ventricular Rate:  75 PR Interval:  358 QRS Duration: 104 QT Interval:  396 QTC Calculation: 442 R Axis:   78 Text Interpretation:  AV  dual-paced rhythm with prolonged AV conduction  Abnormal ECG When compared with ECG of 02-Jul-2011 12:05, Electronic  ventricular pacemaker has replaced Sinus rhythm Confirmed by Nicholai Willette   MD, Brandun Pinn [phone]) on 06/09/2013 11:59:17 AM      MDM   Final diagnoses:  Weakness  Nausea   I personally performed the services described in this documentation, which was scribed in my presence. The recorded information has been reviewed and is accurate.   Patient's pacemaker was monitored by via [employer] and showed no arrhythmias or significant  malfunction's. Patient to followup with cardiology and her Dr. Thyroid stimulating hormone test is still pending. Today's workup negative however patient's symptoms sound very legitimate and additional workup required. Patient or return for any newer worse symptoms. Patient's EKG showed a paced rhythm chest x-Morrish without any significant abnormalities head CT was negative patient has no obvious stroke symptoms. Troponin was negative no evidence of acute cardiac event in the last several days. No significant anemia.    Mervin Kung, MD 06/09/13 [phone]

## 2013-06-10 ENCOUNTER — Encounter: Payer: Self-pay | Admitting: Internal Medicine

## 2013-07-09 ENCOUNTER — Encounter: Payer: Self-pay | Admitting: Cardiology

## 2013-07-09 ENCOUNTER — Ambulatory Visit (HOSPITAL_COMMUNITY)
Admission: RE | Admit: 2013-07-09 | Discharge: 2013-07-09 | Disposition: A | Discharge status: Home / Self Care | Payer: Medicare PPO | Source: Ambulatory Visit | Attending: Cardiology | Admitting: Cardiology

## 2013-07-09 ENCOUNTER — Ambulatory Visit (INDEPENDENT_AMBULATORY_CARE_PROVIDER_SITE_OTHER): Payer: Commercial Managed Care - HMO | Admitting: Cardiology

## 2013-07-09 VITALS — BP 142/100 | HR 67 | Wt 188.0 lb

## 2013-07-09 DIAGNOSIS — I509 Heart failure, unspecified: Secondary | ICD-10-CM | POA: Insufficient documentation

## 2013-07-09 DIAGNOSIS — Z7982 Long term (current) use of aspirin: Secondary | ICD-10-CM | POA: Insufficient documentation

## 2013-07-09 DIAGNOSIS — R0602 Shortness of breath: Secondary | ICD-10-CM

## 2013-07-09 DIAGNOSIS — I495 Sick sinus syndrome: Secondary | ICD-10-CM

## 2013-07-09 DIAGNOSIS — I517 Cardiomegaly: Secondary | ICD-10-CM | POA: Diagnosis not present

## 2013-07-09 DIAGNOSIS — I10 Essential (primary) hypertension: Secondary | ICD-10-CM | POA: Insufficient documentation

## 2013-07-09 DIAGNOSIS — E785 Hyperlipidemia, unspecified: Secondary | ICD-10-CM | POA: Insufficient documentation

## 2013-07-09 DIAGNOSIS — Z79899 Other long term (current) drug therapy: Secondary | ICD-10-CM | POA: Insufficient documentation

## 2013-07-09 DIAGNOSIS — I5032 Chronic diastolic (congestive) heart failure: Secondary | ICD-10-CM | POA: Diagnosis not present

## 2013-07-09 DIAGNOSIS — Z87891 Personal history of nicotine dependence: Secondary | ICD-10-CM | POA: Insufficient documentation

## 2013-07-09 NOTE — Assessment & Plan Note (Signed)
Blood pressure elevated today. Lasix is being increased. May benefit from ACE inhibitor or ARB particularly if she has diastolic dysfunction. Defer additional treatment until followup echocardiogram and lab work.

## 2013-07-09 NOTE — Progress Notes (Signed)
Clinical Summary Kari Hall is a 79 y.o.female who presented to the office early this morning without a scheduled visit asking to be seen secondary to shortness of breath. She was added onto my schedule. I reviewed records finding that she is a long-term patient of Dr. Lovena Le, and is to be establishing with Dr. Bronson Ing on June 10. Last encounter was with Dr. Lovena Le in March of this year, at which time she had normal Medtronic DDD device function. I also see that she called the office in April reporting shortness of breath and fatigue, was seen in the ER, had unremarkable lab work and reportedly normal device function at that time as well.  She tells her that she has been experiencing orthopnea and intermittent shortness of breath at rest for the last 2 months, worse in the last few weeks. Lasix was increased from 20 mg daily to 40 mg daily by her primary care provider approximately 2 weeks ago. She takes her Lasix at bedtime. She was also having some leg edema although this is improving. She denies any palpitations, chest discomfort, dizziness, or syncope. Weight is down 2 pounds from April.  Echocardiogram from July 2012 reported mild to moderate LVH with LVEF 65-70%, no comment on diastolic function, mildly sclerotic aortic valve, MAC, trivial mitral regurgitation.  ECG today shows normal sinus rhythm.   No Known Allergies  Current Outpatient Prescriptions  Medication Sig Dispense Refill  . aspirin 81 MG tablet Take 81 mg by mouth daily.        Marland Kitchen atenolol (TENORMIN) 50 MG tablet Take 50 mg by mouth daily.        . furosemide (LASIX) 20 MG tablet Take 40 mg by mouth daily with lunch.       . levothyroxine (SYNTHROID, LEVOTHROID) 100 MCG tablet Take 100 mcg by mouth daily before breakfast.      . omeprazole (PRILOSEC) 20 MG capsule Take 20 mg by mouth daily.      . ondansetron (ZOFRAN ODT) 4 MG disintegrating tablet Take 1 tablet (4 mg total) by mouth every 8 (eight) hours as needed.  10  tablet  0  . potassium chloride SA (K-DUR,KLOR-CON) 20 MEQ tablet Take 40 mEq by mouth daily.       . [DISCONTINUED] ezetimibe-simvastatin (VYTORIN) 10-40 MG per tablet Take 1 tablet by mouth at bedtime.         No current facility-administered medications for this visit.    Past Medical History  Diagnosis Date  . GERD (gastroesophageal reflux disease)   . Dyslipidemia   . Essential hypertension, benign   . Hypothyroidism   . IBS (irritable bowel syndrome)   . Diverticulitis   . Sinoatrial node dysfunction   . Chronic diastolic heart failure     Social History Ms. Amspacher reports that she quit smoking about 34 years ago. Her smoking use included Cigarettes. She smoked 0.00 packs per day. She does not have any smokeless tobacco history on file. Ms. Pelly reports that she does not drink alcohol.  Review of Systems No cough, fevers or chills. Stable appetite. Has had some nausea with her symptoms. No exertional chest pain. Otherwise as outlined.  Physical Examination Filed Vitals:   07/09/13 0814  BP: 142/100  Pulse: 67   Filed Weights   07/09/13 0814  Weight: 188 lb (85.276 kg)   Overweight woman, appears comfortable at rest. HEENT: Conjunctiva and lids normal, oropharynx clear. Neck: Supple, no elevated JVP or carotid bruits, no thyromegaly. Lungs: Clear  to auscultation, nonlabored breathing at rest. Cardiac: Regular rate and rhythm, no S3, soft systolic murmur, no pericardial rub. Abdomen: Soft, nontender, bowel sounds present. Extremities: Trace edema, distal pulses 2+. Skin: Warm and dry. Musculoskeletal: No kyphosis. Neuropsychiatric: Alert and oriented x3, affect grossly appropriate.   Problem List and Plan   Shortness of breath Intermittent, also reported orthopnea over the last several weeks. Her weight is coming down following an increase in Lasix made two weeks ago by her primary care provider, leg edema also improving. Plan at this time is to have her increase  Lasix to 60 mg daily, take her dose at mid day or early afternoon rather at bedtime, continue potassium supplements. A followup echocardiogram will be obtained to reassess cardiac structure and function. She will keep her visit with Dr. Bronson Ing on June 10 and have a followup BMET and pro-BNP at that time.  Chronic diastolic congestive heart failure Possible diagnosis. Last echocardiogram did not comment on diastolic function. Hopefully this will be further clarified with repeat study.  Sinoatrial node dysfunction Status post Medtronic pacemaker, normal function by most recent assessment. ECG today is normal. She is followed by Dr. Lovena Le.  HYPERTENSION Blood pressure elevated today. Lasix is being increased. May benefit from ACE inhibitor or ARB particularly if she has diastolic dysfunction. Defer additional treatment until followup echocardiogram and lab work.    Satira Sark, M.D., F.A.C.C.

## 2013-07-09 NOTE — Assessment & Plan Note (Signed)
Intermittent, also reported orthopnea over the last several weeks. Her weight is coming down following an increase in Lasix made two weeks ago by her primary care provider, leg edema also improving. Plan at this time is to have her increase Lasix to 60 mg daily, take her dose at mid day or early afternoon rather at bedtime, continue potassium supplements. A followup echocardiogram will be obtained to reassess cardiac structure and function. She will keep her visit with Dr. Bronson Ing on June 10 and have a followup BMET and pro-BNP at that time.

## 2013-07-09 NOTE — Patient Instructions (Addendum)
Your physician recommends that you schedule a follow-up appointment in: keep June 10th apt with Dr.Koneswaran    Please take lasix 60 mg at lunch time for the next week, then take 40 mg daily with lunch thereafter     Your physician has requested that you have an echocardiogram. Echocardiography is a painless test that uses sound waves to create images of your heart. It provides your doctor with information about the size and shape of your heart and how well your heart's chambers and valves are working. This procedure takes approximately one hour. There are no restrictions for this procedure.    Please get blood work just before your June 10 th apt  At 3:40 pm with Dr.Koneswaran (BMET,BNP)     Thank you for choosing [facility] !

## 2013-07-09 NOTE — Assessment & Plan Note (Signed)
Possible diagnosis. Last echocardiogram did not comment on diastolic function. Hopefully this will be further clarified with repeat study.

## 2013-07-09 NOTE — Addendum Note (Signed)
Addended by: Barbarann Ehlers A on: 07/09/2013 09:09 AM   Modules accepted: Orders

## 2013-07-09 NOTE — Assessment & Plan Note (Signed)
Status post [employer]tr[employer] pacemaker, normal function by most recent assessment. ECG today is normal. She is followed by Dr. Lovena Le.

## 2013-07-09 NOTE — Progress Notes (Signed)
  Echocardiogram 2D Echocardiogram has been performed.  Aarion Metzgar L Shakya Sebring 07/09/2013, 11:06 AM

## 2013-07-10 ENCOUNTER — Other Ambulatory Visit: Payer: Self-pay

## 2013-07-10 DIAGNOSIS — I509 Heart failure, unspecified: Secondary | ICD-10-CM

## 2013-07-10 LAB — BASIC METABOLIC PANEL
BUN: 26 mg/dL — ABNORMAL HIGH (ref 6–23)
CALCIUM: 9.5 mg/dL (ref 8.4–10.5)
CO2: 29 meq/L (ref 19–32)
CREATININE: 1.33 mg/dL — AB (ref 0.50–1.10)
Chloride: 99 mEq/L (ref 96–112)
Glucose, Bld: 106 mg/dL — ABNORMAL HIGH (ref 70–99)
Potassium: 3.6 mEq/L (ref 3.5–5.3)
SODIUM: 141 meq/L (ref 135–145)

## 2013-07-10 LAB — BRAIN NATRIURETIC PEPTIDE: BRAIN NATRIURETIC PEPTIDE: 38.4 pg/mL (ref 0.0–100.0)

## 2013-07-21 LAB — BASIC METABOLIC PANEL
BUN: 20 mg/dL (ref 6–23)
CALCIUM: 9.2 mg/dL (ref 8.4–10.5)
CO2: 30 mEq/L (ref 19–32)
Chloride: 101 mEq/L (ref 96–112)
Creat: 1.15 mg/dL — ABNORMAL HIGH (ref 0.50–1.10)
GLUCOSE: 127 mg/dL — AB (ref 70–99)
Potassium: 4.6 mEq/L (ref 3.5–5.3)
SODIUM: 141 meq/L (ref 135–145)

## 2013-07-22 LAB — BRAIN NATRIURETIC PEPTIDE: BRAIN NATRIURETIC PEPTIDE: 27.3 pg/mL (ref 0.0–100.0)

## 2013-07-23 ENCOUNTER — Ambulatory Visit: Payer: Medicare PPO | Admitting: Cardiovascular Disease

## 2013-08-04 ENCOUNTER — Ambulatory Visit (INDEPENDENT_AMBULATORY_CARE_PROVIDER_SITE_OTHER): Payer: Medicare PPO | Admitting: Cardiovascular Disease

## 2013-08-04 VITALS — BP 132/86 | HR 71 | Ht 66.0 in | Wt 189.0 lb

## 2013-08-04 DIAGNOSIS — I5032 Chronic diastolic (congestive) heart failure: Secondary | ICD-10-CM

## 2013-08-04 DIAGNOSIS — I509 Heart failure, unspecified: Secondary | ICD-10-CM

## 2013-08-04 DIAGNOSIS — I10 Essential (primary) hypertension: Secondary | ICD-10-CM

## 2013-08-04 DIAGNOSIS — R109 Unspecified abdominal pain: Secondary | ICD-10-CM

## 2013-08-04 DIAGNOSIS — R110 Nausea: Secondary | ICD-10-CM

## 2013-08-04 DIAGNOSIS — Z950 Presence of cardiac pacemaker: Secondary | ICD-10-CM

## 2013-08-04 DIAGNOSIS — I495 Sick sinus syndrome: Secondary | ICD-10-CM

## 2013-08-04 NOTE — Patient Instructions (Addendum)
Your physician recommends that you schedule a follow-up appointment in: 3 months [place]   Your physician recommends that you continue on your current medications as directed. Please refer to the Current Medication list given to you today.   You have been referred to GI, Dr.Rourk. Expect a call from his office    Thank you for choosing [facility] !

## 2013-08-04 NOTE — Progress Notes (Signed)
Patient ID: Kari Hall, female   DOB: Aug 29, 1934, 79 y.o.   MRN: [id number]      SUBJECTIVE: The patient is a 79 yr old woman who was evaluated by Dr. Domenic Polite in late May for orthopnea and leg swelling. She has a history of sinus node dysfunction and has a pacemaker and is followed by Dr. Lovena Le.  BNP subsequently checked on 6/8 and found to be normal. BUN/creatinine 20/1.15. Echocardiography demonstrated normal LV systolic function, EF 31-54%, mild to moderate LVH and grade I diastolic dysfunction.  Wt 176 lbs (188 on 5/27).  She says her breathing and orthopnea have gradually improved. She has resumed taking Lasix 40 mg daily (had been temporarily increased to 60 mg daily). Her primary complaint relates to nausea and feeling sick to her stomach. She specifically denies chest pain and pressure. She has a history of irritable bowel syndrome.   No Known Allergies  Current Outpatient Prescriptions  Medication Sig Dispense Refill  . aspirin 81 MG tablet Take 81 mg by mouth daily.        Marland Kitchen atenolol (TENORMIN) 50 MG tablet Take 50 mg by mouth daily.        . furosemide (LASIX) 20 MG tablet Take 40 mg by mouth daily with lunch.       . levothyroxine (SYNTHROID, LEVOTHROID) 100 MCG tablet Take 100 mcg by mouth daily before breakfast.      . omeprazole (PRILOSEC) 20 MG capsule Take 20 mg by mouth daily.      . potassium chloride SA (K-DUR,KLOR-CON) 20 MEQ tablet Take 40 mEq by mouth daily.       . [DISCONTINUED] ezetimibe-simvastatin (VYTORIN) 10-40 MG per tablet Take 1 tablet by mouth at bedtime.         No current facility-administered medications for this visit.    Past Medical History  Diagnosis Date  . GERD (gastroesophageal reflux disease)   . Dyslipidemia   . Essential hypertension, benign   . Hypothyroidism   . IBS (irritable bowel syndrome)   . Diverticulitis   . Sinoatrial node dysfunction   . Chronic diastolic heart failure     Past Surgical History  Procedure  Laterality Date  . Cholecystectomy    . Appendectomy    . Colectomy    . Ectopic pregnancy surgery    . Pacemaker placement      History   Social History  . Marital Status: Divorced    Spouse Name: N/A    Number of Children: N/A  . Years of Education: N/A   Occupational History  . Not on file.   Social History Main Topics  . Smoking status: Former Smoker    Types: Cigarettes    Quit date: 02/24/1979  . Smokeless tobacco: Not on file  . Alcohol Use: No  . Drug Use: No  . Sexual Activity: No   Other Topics Concern  . Not on file   Social History Narrative  . No narrative on file     Filed Vitals:   08/04/13 1259  BP: 132/86  Pulse: 71  Height: 5\' 6"  (1.676 m)  Weight: 189 lb (85.73 kg)    PHYSICAL EXAM General: NAD Neck: No JVD, no thyromegaly. Lungs: Clear to auscultation bilaterally with normal respiratory effort. CV: Nondisplaced PMI.  Regular rate and rhythm, normal S1/S2, no S3/S4, no murmur. Trace periankle edema.  No carotid bruit.  Normal pedal pulses.  Abdomen: Soft, nontender, no hepatosplenomegaly, no distention.  Neurologic: Alert and oriented x 3.  Psych: Normal affect. Extremities: No clubbing or cyanosis.   ECG: reviewed and available in electronic records.      ASSESSMENT AND PLAN: 1. Chronic diastolic heart failure: Euvolemic and stable with normal BNP. Continue Lasix 40 mg daily. Has grade I diastolic dysfunction with normal BP today. 2. HTN: Controlled on atenolol 50 mg daily. 3. Nausea and abdominal discomfort: Will refer back to GI given history of IBS. Symptoms atypical for cardiac etiology. 4. SA node dysfunction s/p pacemaker: Normal device function. Followed by Dr. Lovena Le.  Dispo: f/u 3 months.  Kate Sable, M.D., F.A.C.C.

## 2013-08-05 ENCOUNTER — Ambulatory Visit (INDEPENDENT_AMBULATORY_CARE_PROVIDER_SITE_OTHER): Payer: Medicare PPO | Admitting: *Deleted

## 2013-08-05 ENCOUNTER — Telehealth: Payer: Self-pay | Admitting: Cardiology

## 2013-08-05 DIAGNOSIS — I495 Sick sinus syndrome: Secondary | ICD-10-CM

## 2013-08-05 LAB — MDC_IDC_ENUM_SESS_TYPE_REMOTE
Battery Impedance: 1995 Ohm
Battery Voltage: 2.73 V
Brady Statistic AP VS Percent: 5 %
Brady Statistic AS VP Percent: 1 %
Brady Statistic AS VS Percent: 86 %
Lead Channel Impedance Value: 437 Ohm
Lead Channel Impedance Value: 468 Ohm
Lead Channel Pacing Threshold Amplitude: 0.375 V
Lead Channel Pacing Threshold Pulse Width: 0.4 ms
Lead Channel Sensing Intrinsic Amplitude: 2.8 mV
Lead Channel Setting Pacing Amplitude: 2 V
Lead Channel Setting Pacing Amplitude: 2.5 V
Lead Channel Setting Sensing Sensitivity: 4 mV
MDC IDC MSMT BATTERY REMAINING LONGEVITY: 27 mo
MDC IDC MSMT LEADCHNL RA PACING THRESHOLD AMPLITUDE: 0.625 V
MDC IDC MSMT LEADCHNL RA PACING THRESHOLD PULSEWIDTH: 0.4 ms
MDC IDC MSMT LEADCHNL RV SENSING INTR AMPL: 22.4 mV
MDC IDC SESS DTM: 20150623135708
MDC IDC SET LEADCHNL RV PACING PULSEWIDTH: 0.4 ms
MDC IDC STAT BRADY AP VP PERCENT: 8 %

## 2013-08-05 NOTE — Progress Notes (Signed)
Remote pacemaker transmission.   

## 2013-08-05 NOTE — Telephone Encounter (Signed)
Spoke with pt and reminded pt of remote transmission that is due today. Pt verbalized understanding.   

## 2013-08-20 ENCOUNTER — Encounter: Payer: Self-pay | Admitting: Cardiology

## 2013-08-26 ENCOUNTER — Encounter: Payer: Self-pay | Admitting: Internal Medicine

## 2013-09-07 ENCOUNTER — Encounter (HOSPITAL_COMMUNITY): Payer: Self-pay | Admitting: Emergency Medicine

## 2013-09-07 ENCOUNTER — Emergency Department (HOSPITAL_COMMUNITY): Payer: Medicare PPO

## 2013-09-07 ENCOUNTER — Emergency Department (HOSPITAL_COMMUNITY)
Admission: EM | Admit: 2013-09-07 | Discharge: 2013-09-07 | Disposition: A | Discharge status: Home / Self Care | Payer: Medicare PPO | Attending: Emergency Medicine | Admitting: Emergency Medicine

## 2013-09-07 DIAGNOSIS — I10 Essential (primary) hypertension: Secondary | ICD-10-CM | POA: Insufficient documentation

## 2013-09-07 DIAGNOSIS — Z79899 Other long term (current) drug therapy: Secondary | ICD-10-CM | POA: Diagnosis not present

## 2013-09-07 DIAGNOSIS — R5381 Other malaise: Secondary | ICD-10-CM | POA: Insufficient documentation

## 2013-09-07 DIAGNOSIS — R0989 Other specified symptoms and signs involving the circulatory and respiratory systems: Secondary | ICD-10-CM | POA: Insufficient documentation

## 2013-09-07 DIAGNOSIS — Z87891 Personal history of nicotine dependence: Secondary | ICD-10-CM | POA: Insufficient documentation

## 2013-09-07 DIAGNOSIS — E039 Hypothyroidism, unspecified: Secondary | ICD-10-CM | POA: Insufficient documentation

## 2013-09-07 DIAGNOSIS — K219 Gastro-esophageal reflux disease without esophagitis: Secondary | ICD-10-CM | POA: Diagnosis not present

## 2013-09-07 DIAGNOSIS — Z7982 Long term (current) use of aspirin: Secondary | ICD-10-CM | POA: Insufficient documentation

## 2013-09-07 DIAGNOSIS — R0609 Other forms of dyspnea: Secondary | ICD-10-CM | POA: Diagnosis not present

## 2013-09-07 DIAGNOSIS — R0600 Dyspnea, unspecified: Secondary | ICD-10-CM

## 2013-09-07 DIAGNOSIS — I5032 Chronic diastolic (congestive) heart failure: Secondary | ICD-10-CM | POA: Diagnosis not present

## 2013-09-07 DIAGNOSIS — R5383 Other fatigue: Secondary | ICD-10-CM

## 2013-09-07 LAB — CBC WITH DIFFERENTIAL/PLATELET
BASOS ABS: 0 10*3/uL (ref 0.0–0.1)
Basophils Relative: 0 % (ref 0–1)
EOS ABS: 0.1 10*3/uL (ref 0.0–0.7)
EOS PCT: 2 % (ref 0–5)
HEMATOCRIT: 41.7 % (ref 36.0–46.0)
Hemoglobin: 14.6 g/dL (ref 12.0–15.0)
Lymphocytes Relative: 25 % (ref 12–46)
Lymphs Abs: 1.3 10*3/uL (ref 0.7–4.0)
MCH: 31.6 pg (ref 26.0–34.0)
MCHC: 35 g/dL (ref 30.0–36.0)
MCV: 90.3 fL (ref 78.0–100.0)
MONO ABS: 0.6 10*3/uL (ref 0.1–1.0)
Monocytes Relative: 12 % (ref 3–12)
Neutro Abs: 3.2 10*3/uL (ref 1.7–7.7)
Neutrophils Relative %: 61 % (ref 43–77)
Platelets: 131 10*3/uL — ABNORMAL LOW (ref 150–400)
RBC: 4.62 MIL/uL (ref 3.87–5.11)
RDW: 12.8 % (ref 11.5–15.5)
WBC: 5.2 10*3/uL (ref 4.0–10.5)

## 2013-09-07 LAB — COMPREHENSIVE METABOLIC PANEL
ALBUMIN: 3.9 g/dL (ref 3.5–5.2)
ALT: 14 U/L (ref 0–35)
AST: 18 U/L (ref 0–37)
Alkaline Phosphatase: 113 U/L (ref 39–117)
Anion gap: 15 (ref 5–15)
BUN: 23 mg/dL (ref 6–23)
CALCIUM: 9.2 mg/dL (ref 8.4–10.5)
CO2: 28 mEq/L (ref 19–32)
Chloride: 100 mEq/L (ref 96–112)
Creatinine, Ser: 1.16 mg/dL — ABNORMAL HIGH (ref 0.50–1.10)
GFR calc Af Amer: 51 mL/min — ABNORMAL LOW (ref >90)
GFR calc non Af Amer: 44 mL/min — ABNORMAL LOW (ref >90)
Glucose, Bld: 104 mg/dL — ABNORMAL HIGH (ref 70–99)
Potassium: 3.4 mEq/L — ABNORMAL LOW (ref 3.7–5.3)
Sodium: 143 mEq/L (ref 137–147)
TOTAL PROTEIN: 7.4 g/dL (ref 6.0–8.3)
Total Bilirubin: 1.2 mg/dL (ref 0.3–1.2)

## 2013-09-07 LAB — PRO B NATRIURETIC PEPTIDE: PRO B NATRI PEPTIDE: 180.7 pg/mL (ref 0–450)

## 2013-09-07 LAB — TROPONIN I

## 2013-09-07 NOTE — ED Notes (Signed)
PT ambulated with nurse and maintained a O2 sat >92%. PT denies any SOB with ambulation.

## 2013-09-07 NOTE — ED Notes (Signed)
Patient c/o intermittent generalized weakness and shortness of breath. Denies any chest pain or fevers. Per patient seen here 4-5 weeks ago for same reason but were unable to find any cause. Patient states "They thought it was my pacemaker but my doctor checked that and it isn't that."

## 2013-09-07 NOTE — ED Provider Notes (Signed)
CSN: [id number]     Arrival date & time 09/07/13  7591 History  This chart was scribed for Sharyon Cable, MD by Girtha Hake, ED Scribe. The patient was seen in APA06/AP[facility]06. The patient's care was started at 8:45 AM.   Chief Complaint  Patient presents with  . Fatigue  . Shortness of Breath    Patient is a 79 y.o. female presenting with shortness of breath. The history is provided by the patient. No language interpreter was used.  Shortness of Breath Severity:  Moderate Duration:  2 days Timing:  Constant Progression since onset: Changes with activity (walking vs. sitting) Context: activity   Context comment:  Exacerbated by ambulating or lying flat Relieved by:  Nothing Worsened by:  Activity Ineffective treatments:  None tried Associated symptoms: no abdominal pain, no chest pain, no cough, no headaches, no sputum production, no syncope and no vomiting    HPI Comments: Kari Hall is a 79 y.o. female who presents to the Emergency Department complaining of SOB beginning two days ago. She states that the SOB is most severe when ambulating or lying down flat. She reports associated dizziness. Patient denies CP, cough, vomiting, diarrhea, abdominal pain, syncope, weakness, headache, slurred speech, or leg swelling. Patient denies a history of DVT or PE.   Patient sees Dr. Lovena Le, cardiology. PCP is Dr. Gerarda Fraction.  Past Medical History  Diagnosis Date  . GERD (gastroesophageal reflux disease)   . Dyslipidemia   . Essential hypertension, benign   . Hypothyroidism   . IBS (irritable bowel syndrome)   . Diverticulitis   . Sinoatrial node dysfunction   . Chronic diastolic heart failure    Past Surgical History  Procedure Laterality Date  . Cholecystectomy    . Appendectomy    . Colectomy    . Ectopic pregnancy surgery    . Pacemaker placement     No family history on file. History  Substance Use Topics  . Smoking status: Former Smoker    Types: Cigarettes    Quit date:  02/24/1979  . Smokeless tobacco: Not on file  . Alcohol Use: No   OB History   Grav Para Term Preterm Abortions TAB SAB Ect Mult Living                 Review of Systems  Respiratory: Positive for shortness of breath. Negative for cough and sputum production.   Cardiovascular: Negative for chest pain and syncope.  Gastrointestinal: Negative for vomiting and abdominal pain.  Neurological: Negative for headaches.  All other systems reviewed and are negative.     Allergies  Review of patient's allergies indicates no known allergies.  Home Medications   Prior to Admission medications   Medication Sig Start Date End Date Taking? Authorizing Provider  aspirin 81 MG tablet Take 81 mg by mouth daily.      Historical Provider, MD  atenolol (TENORMIN) 50 MG tablet Take 50 mg by mouth daily.      Historical Provider, MD  furosemide (LASIX) 20 MG tablet Take 40 mg by mouth daily with lunch.  03/14/13   Lendon Colonel, NP  levothyroxine (SYNTHROID, LEVOTHROID) 100 MCG tablet Take 100 mcg by mouth daily before breakfast.    Historical Provider, MD  omeprazole (PRILOSEC) 20 MG capsule Take 20 mg by mouth daily.    Historical Provider, MD  potassium chloride SA (K-DUR,KLOR-CON) 20 MEQ tablet Take 40 mEq by mouth daily.     Historical Provider, MD  BP 148/68  Pulse 71  Temp(Src) 98 F (36.7 C)  Resp 26  Ht 5\' 6"  (1.676 m)  Wt 184 lb (83.462 kg)  BMI 29.71 kg/m2  SpO2 96% Physical Exam CONSTITUTIONAL: Well developed/well nourished HEAD: Normocephalic/atraumatic EYES: EOMI/PERRL ENMT: Mucous membranes moist NECK: supple no meningeal signs SPINE:entire spine nontender CV: S1/S2 noted, no murmurs/rubs/gallops noted LUNGS: decreased breath sounds bilaterally ABDOMEN: soft, nontender, no rebound or guarding GU:no cva tenderness NEURO: Pt is awake/alert, moves all extremitiesx4 EXTREMITIES: pulses normal, full ROM, no LE edema/tenderness SKIN: warm, color normal PSYCH: no  abnormalities of mood noted  ED Course  Procedures  DIAGNOSTIC STUDIES: Oxygen Saturation is 96% on room air, adequate by my interpretation.    COORDINATION OF CARE: 8:51 AM-Discussed treatment plan which includes CXR, EKG, cardiac monitoring, and labs with pt at bedside and pt agreed to plan.   10:08 AM Pt improved She ambulated and had no dyspnea, and no CP She feels well She admits this has been intermittent for awhile and has had evaluations for this previously No signs of decompensated CHF.  No acute pacemaker complications suspect I doubt ACS or PE at this time Discussed strict return precautions She also requested f/u with GI and this was given in d/c paperwork   Labs Review Labs Reviewed  CBC WITH DIFFERENTIAL - Abnormal; Notable for the following:    Platelets 131 (*)    All other components within normal limits  COMPREHENSIVE METABOLIC PANEL - Abnormal; Notable for the following:    Potassium 3.4 (*)    Glucose, Bld 104 (*)    Creatinine, Ser 1.16 (*)    GFR calc non Af Amer 44 (*)    GFR calc Af Amer 51 (*)    All other components within normal limits  TROPONIN I  PRO B NATRIURETIC PEPTIDE    Imaging Review Dg Chest Portable 1 View  09/07/2013   CLINICAL DATA:  Shortness of breath and fatigue, remote smoker, pacemaker  EXAM: PORTABLE CHEST - 1 VIEW  COMPARISON:  06/09/2013  FINDINGS: Left subclavian 2 lead pacer noted. Cardiomegaly present without associated edema or CHF. No focal pneumonia, collapse or consolidation. Negative for effusion or pneumothorax. Trachea midline. Bones appear osteopenic. Overall stable exam.  IMPRESSION: cardiomegaly without superimposed acute process.   Electronically Signed   By: Daryll Brod M.D.   On: 09/07/2013 09:19     EKG Interpretation   Date/Time:  Sunday September 07 2013 08:48:21 EDT Ventricular Rate:  64 PR Interval:  234 QRS Duration: 73 QT Interval:  430 QTC Calculation: 444 R Axis:   21 Text Interpretation:  Sinus  or ectopic atrial rhythm Prolonged PR interval  Consider left atrial enlargement Confirmed by Christy Gentles  MD, Elenore Rota ([id number])  on 09/07/2013 9:01:06 AM      MDM   Final diagnoses:  Other fatigue  Dyspnea    Nursing notes including past medical history and social history reviewed and considered in documentation xrays reviewed and considered Labs/vital reviewed and considered Previous records reviewed and considered  I personally performed the services described in this documentation, which was scribed in my presence. The recorded information has been reviewed and is accurate.     Sharyon Cable, MD 09/07/13 1009

## 2013-09-08 ENCOUNTER — Encounter (HOSPITAL_COMMUNITY): Payer: Self-pay | Admitting: Emergency Medicine

## 2013-09-08 ENCOUNTER — Emergency Department (HOSPITAL_COMMUNITY): Payer: Medicare PPO

## 2013-09-08 ENCOUNTER — Emergency Department (HOSPITAL_COMMUNITY)
Admission: EM | Admit: 2013-09-08 | Discharge: 2013-09-08 | Disposition: A | Discharge status: Home / Self Care | Payer: Medicare PPO | Source: Home / Self Care | Attending: Emergency Medicine | Admitting: Emergency Medicine

## 2013-09-08 ENCOUNTER — Observation Stay (HOSPITAL_COMMUNITY)
Admission: EM | Admit: 2013-09-08 | Discharge: 2013-09-08 | Disposition: A | Discharge status: Home / Self Care | Payer: Medicare PPO | Attending: Emergency Medicine | Admitting: Emergency Medicine

## 2013-09-08 DIAGNOSIS — K219 Gastro-esophageal reflux disease without esophagitis: Secondary | ICD-10-CM

## 2013-09-08 DIAGNOSIS — Z7982 Long term (current) use of aspirin: Secondary | ICD-10-CM | POA: Diagnosis not present

## 2013-09-08 DIAGNOSIS — E039 Hypothyroidism, unspecified: Secondary | ICD-10-CM

## 2013-09-08 DIAGNOSIS — Z8639 Personal history of other endocrine, nutritional and metabolic disease: Secondary | ICD-10-CM | POA: Insufficient documentation

## 2013-09-08 DIAGNOSIS — I10 Essential (primary) hypertension: Secondary | ICD-10-CM

## 2013-09-08 DIAGNOSIS — R0602 Shortness of breath: Secondary | ICD-10-CM | POA: Insufficient documentation

## 2013-09-08 DIAGNOSIS — I509 Heart failure, unspecified: Secondary | ICD-10-CM

## 2013-09-08 DIAGNOSIS — R0989 Other specified symptoms and signs involving the circulatory and respiratory systems: Secondary | ICD-10-CM | POA: Diagnosis not present

## 2013-09-08 DIAGNOSIS — Z950 Presence of cardiac pacemaker: Secondary | ICD-10-CM | POA: Diagnosis present

## 2013-09-08 DIAGNOSIS — Z79899 Other long term (current) drug therapy: Secondary | ICD-10-CM | POA: Diagnosis not present

## 2013-09-08 DIAGNOSIS — R0609 Other forms of dyspnea: Secondary | ICD-10-CM | POA: Diagnosis not present

## 2013-09-08 DIAGNOSIS — Z87891 Personal history of nicotine dependence: Secondary | ICD-10-CM | POA: Insufficient documentation

## 2013-09-08 DIAGNOSIS — Z862 Personal history of diseases of the blood and blood-forming organs and certain disorders involving the immune mechanism: Secondary | ICD-10-CM

## 2013-09-08 DIAGNOSIS — E876 Hypokalemia: Secondary | ICD-10-CM

## 2013-09-08 DIAGNOSIS — I5032 Chronic diastolic (congestive) heart failure: Secondary | ICD-10-CM | POA: Diagnosis not present

## 2013-09-08 DIAGNOSIS — R0600 Dyspnea, unspecified: Secondary | ICD-10-CM

## 2013-09-08 DIAGNOSIS — D696 Thrombocytopenia, unspecified: Secondary | ICD-10-CM | POA: Diagnosis present

## 2013-09-08 LAB — URINALYSIS, ROUTINE W REFLEX MICROSCOPIC
BILIRUBIN URINE: NEGATIVE
GLUCOSE, UA: NEGATIVE mg/dL
HGB URINE DIPSTICK: NEGATIVE
KETONES UR: NEGATIVE mg/dL
Nitrite: NEGATIVE
PROTEIN: NEGATIVE mg/dL
Specific Gravity, Urine: <1.005 — ABNORMAL LOW (ref 1.005–1.030)
UROBILINOGEN UA: 0.2 mg/dL (ref 0.0–1.0)
pH: 5.5 (ref 5.0–8.0)

## 2013-09-08 LAB — CBC WITH DIFFERENTIAL/PLATELET
BASOS PCT: 0 % (ref 0–1)
Basophils Absolute: 0 10*3/uL (ref 0.0–0.1)
Basophils Absolute: 0 10*3/uL (ref 0.0–0.1)
Basophils Relative: 0 % (ref 0–1)
Eosinophils Absolute: 0.1 10*3/uL (ref 0.0–0.7)
Eosinophils Absolute: 0.2 10*3/uL (ref 0.0–0.7)
Eosinophils Relative: 2 % (ref 0–5)
Eosinophils Relative: 3 % (ref 0–5)
HCT: 37.5 % (ref 36.0–46.0)
HCT: 39.4 % (ref 36.0–46.0)
Hemoglobin: 13.2 g/dL (ref 12.0–15.0)
Hemoglobin: 13.8 g/dL (ref 12.0–15.0)
LYMPHS ABS: 1.3 10*3/uL (ref 0.7–4.0)
LYMPHS ABS: 1.8 10*3/uL (ref 0.7–4.0)
Lymphocytes Relative: 23 % (ref 12–46)
Lymphocytes Relative: 27 % (ref 12–46)
MCH: 31.5 pg (ref 26.0–34.0)
MCH: 31.6 pg (ref 26.0–34.0)
MCHC: 35.2 g/dL (ref 30.0–36.0)
MCHC: 35 g/dL (ref 30.0–36.0)
MCV: 89.5 fL (ref 78.0–100.0)
MCV: 90.2 fL (ref 78.0–100.0)
MONOS PCT: 13 % — AB (ref 3–12)
MONOS PCT: 13 % — AB (ref 3–12)
Monocytes Absolute: 0.7 10*3/uL (ref 0.1–1.0)
Monocytes Absolute: 0.9 10*3/uL (ref 0.1–1.0)
NEUTROS ABS: 3.5 10*3/uL (ref 1.7–7.7)
NEUTROS PCT: 63 % (ref 43–77)
NEUTROS PCT: 57 % (ref 43–77)
Neutro Abs: 3.8 10*3/uL (ref 1.7–7.7)
PLATELETS: 118 10*3/uL — AB (ref 150–400)
Platelets: 128 10*3/uL — ABNORMAL LOW (ref 150–400)
RBC: 4.19 MIL/uL (ref 3.87–5.11)
RBC: 4.37 MIL/uL (ref 3.87–5.11)
RDW: 12.7 % (ref 11.5–15.5)
RDW: 12.9 % (ref 11.5–15.5)
WBC: 5.6 10*3/uL (ref 4.0–10.5)
WBC: 6.6 10*3/uL (ref 4.0–10.5)

## 2013-09-08 LAB — COMPREHENSIVE METABOLIC PANEL
ALK PHOS: 119 U/L — AB (ref 39–117)
ALT: 14 U/L (ref 0–35)
ANION GAP: 15 (ref 5–15)
AST: 16 U/L (ref 0–37)
Albumin: 3.5 g/dL (ref 3.5–5.2)
BILIRUBIN TOTAL: 0.7 mg/dL (ref 0.3–1.2)
BUN: 23 mg/dL (ref 6–23)
CO2: 25 meq/L (ref 19–32)
Calcium: 9.2 mg/dL (ref 8.4–10.5)
Chloride: 101 mEq/L (ref 96–112)
Creatinine, Ser: 1.19 mg/dL — ABNORMAL HIGH (ref 0.50–1.10)
GFR calc Af Amer: 50 mL/min — ABNORMAL LOW (ref >90)
GFR, EST NON AFRICAN AMERICAN: 43 mL/min — AB (ref >90)
GLUCOSE: 126 mg/dL — AB (ref 70–99)
POTASSIUM: 3.6 meq/L — AB (ref 3.7–5.3)
SODIUM: 141 meq/L (ref 137–147)
Total Protein: 6.8 g/dL (ref 6.0–8.3)

## 2013-09-08 LAB — BASIC METABOLIC PANEL
ANION GAP: 15 (ref 5–15)
BUN: 24 mg/dL — ABNORMAL HIGH (ref 6–23)
CO2: 28 mEq/L (ref 19–32)
Calcium: 8.8 mg/dL (ref 8.4–10.5)
Chloride: 101 mEq/L (ref 96–112)
Creatinine, Ser: 1.16 mg/dL — ABNORMAL HIGH (ref 0.50–1.10)
GFR calc non Af Amer: 44 mL/min — ABNORMAL LOW (ref >90)
GFR, EST AFRICAN AMERICAN: 51 mL/min — AB (ref >90)
Glucose, Bld: 122 mg/dL — ABNORMAL HIGH (ref 70–99)
POTASSIUM: 2.7 meq/L — AB (ref 3.7–5.3)
Sodium: 144 mEq/L (ref 137–147)

## 2013-09-08 LAB — URINE MICROSCOPIC-ADD ON

## 2013-09-08 LAB — MAGNESIUM: MAGNESIUM: 1.3 mg/dL — AB (ref 1.5–2.5)

## 2013-09-08 LAB — PRO B NATRIURETIC PEPTIDE
PRO B NATRI PEPTIDE: 226.7 pg/mL (ref 0–450)
PRO B NATRI PEPTIDE: 278.3 pg/mL (ref 0–450)

## 2013-09-08 LAB — D-DIMER, QUANTITATIVE
D-Dimer, Quant: <0.27 ug/mL-FEU (ref 0.00–0.48)
D-Dimer, Quant: 0.52 ug/mL-FEU — ABNORMAL HIGH (ref 0.00–0.48)

## 2013-09-08 LAB — TROPONIN I: Troponin I: <0.3 ng/mL (ref <0.30)

## 2013-09-08 LAB — VITAMIN B12: Vitamin B-12: 306 pg/mL (ref 211–911)

## 2013-09-08 LAB — POTASSIUM: Potassium: 3.5 mEq/L — ABNORMAL LOW (ref 3.7–5.3)

## 2013-09-08 MED ORDER — POTASSIUM CHLORIDE CRYS ER 20 MEQ PO TBCR
40.0000 meq | EXTENDED_RELEASE_TABLET | Freq: Two times a day (BID) | ORAL | Status: DC
  Administered 2013-09-08: 40 meq via ORAL
  Filled 2013-09-08: qty 2

## 2013-09-08 MED ORDER — ASPIRIN 81 MG PO CHEW
324.0000 mg | CHEWABLE_TABLET | Freq: Once | ORAL | Status: AC
Start: 2013-09-08 — End: 2013-09-08
  Administered 2013-09-08: 324 mg via ORAL
  Filled 2013-09-08: qty 4

## 2013-09-08 MED ORDER — POTASSIUM CHLORIDE 10 MEQ/100ML IV SOLN
10.0000 meq | INTRAVENOUS | Status: AC
  Administered 2013-09-08: 10 meq via INTRAVENOUS
  Filled 2013-09-08: qty 100

## 2013-09-08 MED ORDER — MAGNESIUM SULFATE IN D5W 10-5 MG/ML-% IV SOLN: 1.0000 g | Freq: Once | INTRAVENOUS | Status: DC

## 2013-09-08 MED ORDER — MAGNESIUM SULFATE 50 % IJ SOLN
1.0000 g | Freq: Once | INTRAVENOUS | Status: AC
  Administered 2013-09-08: 1 g via INTRAVENOUS
  Filled 2013-09-08: qty 2

## 2013-09-08 MED ORDER — IOHEXOL 350 MG/ML SOLN
80.0000 mL | Freq: Once | INTRAVENOUS | Status: AC | PRN
  Administered 2013-09-08: 80 mL via INTRAVENOUS

## 2013-09-08 MED ORDER — ONDANSETRON HCL 4 MG/2ML IJ SOLN: 4.0000 mg | Freq: Three times a day (TID) | INTRAMUSCULAR | Status: DC | PRN

## 2013-09-08 MED ORDER — POTASSIUM CHLORIDE 10 MEQ/100ML IV SOLN
10.0000 meq | Freq: Once | INTRAVENOUS | Status: AC
  Administered 2013-09-08: 10 meq via INTRAVENOUS
  Filled 2013-09-08: qty 100

## 2013-09-08 MED ORDER — DIPHENHYDRAMINE HCL 25 MG PO CAPS
25.0000 mg | ORAL_CAPSULE | Freq: Once | ORAL | Status: AC
  Administered 2013-09-08: 25 mg via ORAL
  Filled 2013-09-08: qty 1

## 2013-09-08 NOTE — ED Notes (Signed)
Pt c/o sob after being discharged this morning and she went home and cleaned her house. Pt states she cannot breathe.

## 2013-09-08 NOTE — Consult Note (Addendum)
[facility] History and Physical  Kari Hall TKW:[id number] DOB: 1934-03-29 DOA: 09/08/2013  Referring physician: ED physician, Dr. Reather Converse PCP: Glo Herring., MD   Chief Complaint: Shortness of breath.  HPI: Kari Hall is a 79 y.o. female with a history of chronic diastolic heart failure, hypothyroidism, hypertension, and irritable bowel syndrome. She presents to the ED early this morning with a chief complaint of shortness of breath. She was asleep at the time. She was awakened with acute shortness of breath and sweatiness. She has had intermittent bouts of shortness of breath at rest and with activity over the past several weeks. The shortness of breath is not associated with pleurisy or chest pain. She denies palpitations. Apparently, her pacemaker has been recently assessed and is reportedly working properly. She denies missing any doses of her Lasix or potassium. She has had no complaints of nausea, vomiting, diarrhea, or swelling in her legs. She has had no recent cough or upper respiratory infection symptoms. She denies anxiousness or being anxious. She lives alone. She was evaluated in the ED yesterday for the same, but was discharged to home. Currently, she is sitting up in bed in the ED and has no complaints of chest pain or shortness of breath.  In the ED, she is afebrile and hemodynamically stable. Her heart rate is ranging from 59-77 beats per minute. Her blood pressure is within normal limits. She is oxygenating 97% on room air. Her initial lab data are significant for a serum potassium of 2.7 and a platelet count of 118. Her troponin I is negative x2. Her proBNP is within normal limits. Her EKG reveals normal sinus rhythm with nonspecific ST abnormalities, PVC, and a heart rate of 85 beats per minute. Her chest x-Macaulay reveals cardiomegaly without acute process.  Review of Systems:   As above in history present illness, otherwise negative.      Past Medical History    Diagnosis Date  . GERD (gastroesophageal reflux disease)   . Dyslipidemia   . Essential hypertension, benign   . Hypothyroidism   . IBS (irritable bowel syndrome)   . Diverticulitis   . Sinoatrial node dysfunction   . Chronic diastolic heart failure    Past Surgical History  Procedure Laterality Date  . Cholecystectomy    . Appendectomy    . Colectomy    . Ectopic pregnancy surgery    . Pacemaker placement     Social History:  reports that she quit smoking about 34 years ago. Her smoking use included Cigarettes. She has a 22.5 pack-year smoking history. She has never used smokeless tobacco. She reports that she does not drink alcohol or use illicit drugs. She lives alone. She has 3 children. She still drives.  No Known Allergies  Family history: Her mother died during an operation. Her father died of a possible stroke.   Prior to Admission medications   Medication Sig Start Date End Date Taking? Authorizing Provider  aspirin 81 MG tablet Take 81 mg by mouth daily.     Yes Historical Provider, MD  atenolol (TENORMIN) 50 MG tablet Take 50 mg by mouth daily.     Yes Historical Provider, MD  furosemide (LASIX) 20 MG tablet Take 40 mg by mouth daily with lunch.  03/14/13  Yes Lendon Colonel, NP  levothyroxine (SYNTHROID, LEVOTHROID) 100 MCG tablet Take 100 mcg by mouth daily before breakfast.   Yes Historical Provider, MD  omeprazole (PRILOSEC) 20 MG capsule Take 20 mg by mouth daily.  Yes Historical Provider, MD  potassium chloride SA (K-DUR,KLOR-CON) 20 MEQ tablet Take 40 mEq by mouth daily.   Yes Historical Provider, MD   Physical Exam: Filed Vitals:   09/08/13 0700 09/08/13 0715 09/08/13 0730 09/08/13 0800  BP: 109/53  122/57 122/71  Pulse: 59 62 59 62  Temp:      TempSrc:      Resp: 16 15 13 17   Height:      Weight:      SpO2: 96% 97% 98% 97%    Wt Readings from Last 3 Encounters:  09/08/13 85.276 kg (188 lb)  09/07/13 83.462 kg (184 lb)  08/04/13 85.73 kg (189  lb)    General:  Appears calm and comfortable; patient is sitting up in bed in the ED, without complaints. Eyes: PERRL, normal lids, irises & conjunctiva ENT: grossly normal hearing, Hall & tongue Neck: no LAD, masses or thyromegaly Cardiovascular: S1, S2, with an occasional ectopic beat. No LE edema. Telemetry: Normal sinus rhythm with occasional sinus bradycardia.  Respiratory: CTA bilaterally, no w/r/r. Normal respiratory effort. Abdomen: Positive bowel sounds, soft, ntnd Skin: no rash or induration seen on limited exam Musculoskeletal: grossly normal tone BUE/BLE Psychiatric: Flat affect. She is alert and oriented x3. Her speech is clear. Neurologic: grossly non-focal.          Labs on Admission:  Basic Metabolic Panel:  Recent Labs Lab 09/07/13 0850 09/08/13 0506 09/08/13 0508 09/08/13 0901  NA 143 144  --   --   K 3.4* 2.7*  --  3.5*  CL 100 101  --   --   CO2 28 28  --   --   GLUCOSE 104* 122*  --   --   BUN 23 24*  --   --   CREATININE 1.16* 1.16*  --   --   CALCIUM 9.2 8.8  --   --   MG  --   --  1.3*  --    Liver Function Tests:  Recent Labs Lab 09/07/13 0850  AST 18  ALT 14  ALKPHOS 113  BILITOT 1.2  PROT 7.4  ALBUMIN 3.9   No results found for this basename: LIPASE, AMYLASE,  in the last 168 hours No results found for this basename: AMMONIA,  in the last 168 hours CBC:  Recent Labs Lab 09/07/13 0850 09/08/13 0506  WBC 5.2 5.6  NEUTROABS 3.2 3.5  HGB 14.6 13.2  HCT 41.7 37.5  MCV 90.3 89.5  PLT 131* 118*   Cardiac Enzymes:  Recent Labs Lab 09/07/13 0850 09/08/13 0506  TROPONINI <0.30 <0.30    BNP (last 3 results)  Recent Labs  09/07/13 0850 09/08/13 0506  PROBNP 180.7 226.7   CBG: No results found for this basename: GLUCAP,  in the last 168 hours  Radiological Exams on Admission: Dg Chest Portable 1 View  09/07/2013   CLINICAL DATA:  Shortness of breath and fatigue, remote smoker, pacemaker  EXAM: PORTABLE CHEST - 1 VIEW   COMPARISON:  06/09/2013  FINDINGS: Left subclavian 2 lead pacer noted. Cardiomegaly present without associated edema or CHF. No focal pneumonia, collapse or consolidation. Negative for effusion or pneumothorax. Trachea midline. Bones appear osteopenic. Overall stable exam.  IMPRESSION: cardiomegaly without superimposed acute process.   Electronically Signed   By: Daryll Brod M.D.   On: 09/07/2013 09:19    EKG: Independently reviewed. EKG #1 reveals sinus rhythm with PVC, nonspecific ST abnormalities. EKG #2 reveals sinus or ectopic atrial rhythm, nonspecific ST  abnormalities, and prolonged QT. These changes are slightly different from EKG on 7/26.  Assessment/Plan Principal Problem:   Acute dyspnea Active Problems:   HYPOTHYROIDISM   HYPERTENSION   Cardiac pacemaker in situ   Chronic diastolic congestive heart failure   Thrombocytopenia   Hypokalemia   1. Acute dyspnea. Etiology is not clearly elucidated. Her lungs are clear. Her chest x-Luis reveals no pulmonary edema. Her pro BNP is normal. She is not in heart failure. Her troponin I is normal x2. She has a pacemaker, but it appears to be functioning. She does have some EKG changes, but no chest pain. The EKG changes could be attributed to hypokalemia and hypomagnesemia. Would favor discharging the patient to home with close followup with cardiology. An appointment has been made for Thursday at 3:30 PM. Consider reevaluation of her pacemaker functioning at that time. For now, will advise that the patient decrease her atenolol to half a tablet daily. Of note, the patient ambulated to the bathroom with no complaints of shortness of breath or chest pain. 2. Hypokalemia and mild hypomagnesemia. The patient's hypokalemia is likely secondary to chronic Lasix therapy. She takes 40 mEq daily per her history. She was instructed to increase her potassium chloride to 60 mEq daily. She was given 40 mEq of potassium orally and 2 runs of potassium chloride.  She was also given 1 g of magnesium sulfate. Her serum potassium improved to 3.5. She needs a followup BMET at her cardiology followup appointment. 3. Chronic diastolic congestive heart failure. This appears to be stable. She was instructed to continue her medications with exception of decreasing the dose of atenolol to 25 mg daily and to increase potassium chloride to 60 mEq daily. 4. Status post pacemaker. Appears to be stable currently. Recommend followup evaluation at the cardiology appointment. 5. Hypothyroidism. She was instructed to continue Synthroid. Her TSH was within normal limits in April 2015. 6. Thrombocytopenia. Etiology unknown. It looks like she has had thrombocytopenia dating back to 2013. A vitamin B12 level was ordered and the results will be followed up upon. She may need hematology evaluation as an outpatient. This will be deferred to her primary care provider.   Recommend that the patient be discharged to home with instructions as above. I have asked the case manager to evaluate the patient for home monitoring I and registered nurse at least once or twice as the patient lives home alone. I instructed the patient to followup with her primary care provider on a regular basis. She was instructed to followup with cardiology as scheduled.  Code Status: Full code  Disposition Plan: Discharge to home  Time spent: One hour  [facility] Pager [phone]  **Disclaimer: This note may have been dictated with voice recognition software. Similar sounding words can inadvertently be transcribed and this note may contain transcription errors which may not have been corrected upon publication of note.**

## 2013-09-08 NOTE — ED Provider Notes (Addendum)
CSN: [id number]     Arrival date & time 09/08/13  0415 History   First MD Initiated Contact with Patient 09/08/13 0448     Chief Complaint  Patient presents with  . Shortness of Breath  . Anxiety     (Consider location/radiation/quality/duration/timing/severity/associated sxs/prior Treatment) HPI Comments: 79 year old female with smoking, lipids, cardiac pacemaker, sinoatrial node dysfunction, diastolic heart failure presents after severe episode of shortness of breath and diaphoresis that woke her from sleep. Patient has had a few short episodes of shortness of breath happen both with lying flat sitting out and walking the past week. Patient currently asymptomatic in symptoms lasted until EMS arrived.Patient denies blood clot history, active cancer, recent major trauma or surgery, unilateral leg swelling/ pain, recent long travel, hemoptysis. Patient denies heart attack history. No chest pain.   The history is provided by the patient.    Past Medical History  Diagnosis Date  . GERD (gastroesophageal reflux disease)   . Dyslipidemia   . Essential hypertension, benign   . Hypothyroidism   . IBS (irritable bowel syndrome)   . Diverticulitis   . Sinoatrial node dysfunction   . Chronic diastolic heart failure    Past Surgical History  Procedure Laterality Date  . Cholecystectomy    . Appendectomy    . Colectomy    . Ectopic pregnancy surgery    . Pacemaker placement     History reviewed. No pertinent family history. History  Substance Use Topics  . Smoking status: Former Smoker -- 0.50 packs/day for 45 years    Types: Cigarettes    Quit date: 02/24/1979  . Smokeless tobacco: Never Used  . Alcohol Use: No   OB History   Grav Para Term Preterm Abortions TAB SAB Ect Mult Living   3 3 3       3      Review of Systems  Constitutional: Positive for diaphoresis. Negative for fever and chills.  HENT: Negative for congestion.   Eyes: Negative for visual disturbance.   Respiratory: Positive for shortness of breath.   Cardiovascular: Negative for chest pain.  Gastrointestinal: Negative for vomiting and abdominal pain.  Genitourinary: Negative for dysuria and flank pain.  Musculoskeletal: Negative for back pain, neck pain and neck stiffness.  Skin: Negative for rash.  Neurological: Negative for light-headedness and headaches.      Allergies  Review of patient's allergies indicates no known allergies.  Home Medications   Prior to Admission medications   Medication Sig Start Date End Date Taking? Authorizing Provider  aspirin 81 MG tablet Take 81 mg by mouth daily.      Historical Provider, MD  atenolol (TENORMIN) 50 MG tablet Take 50 mg by mouth daily.      Historical Provider, MD  furosemide (LASIX) 20 MG tablet Take 40 mg by mouth daily with lunch.  03/14/13   Lendon Colonel, NP  levothyroxine (SYNTHROID, LEVOTHROID) 100 MCG tablet Take 100 mcg by mouth daily before breakfast.    Historical Provider, MD  omeprazole (PRILOSEC) 20 MG capsule Take 20 mg by mouth daily.    Historical Provider, MD  potassium chloride SA (K-DUR,KLOR-CON) 20 MEQ tablet Take 40 mEq by mouth daily.    Historical Provider, MD   BP 118/68  Pulse 67  Temp(Src) 97.6 F (36.4 C) (Oral)  Resp 16  Ht 5\' 6"  (1.676 m)  Wt 188 lb (85.276 kg)  BMI 30.36 kg/m2  SpO2 95% Physical Exam  Nursing note and vitals reviewed. Constitutional: She is oriented  to person, place, and time. She appears well-developed and well-nourished.  HENT:  Head: Normocephalic and atraumatic.  Eyes: Conjunctivae are normal. Right eye exhibits no discharge. Left eye exhibits no discharge.  Neck: Normal range of motion. Neck supple. No tracheal deviation present.  Cardiovascular: Normal rate and regular rhythm.   Pulmonary/Chest: Effort normal and breath sounds normal.  Abdominal: Soft. She exhibits no distension. There is no tenderness. There is no guarding.  Musculoskeletal: She exhibits no edema  and no tenderness.  Neurological: She is alert and oriented to person, place, and time.  Skin: Skin is warm. No rash noted.  Psychiatric: She has a normal mood and affect.    ED Course  Procedures (including critical care time) Labs Review Labs Reviewed  BASIC METABOLIC PANEL - Abnormal; Notable for the following:    Potassium 2.7 (*)    Glucose, Bld 122 (*)    BUN 24 (*)    Creatinine, Ser 1.16 (*)    GFR calc non Af Amer 44 (*)    GFR calc Af Amer 51 (*)    All other components within normal limits  CBC WITH DIFFERENTIAL - Abnormal; Notable for the following:    Platelets 118 (*)    Monocytes Relative 13 (*)    All other components within normal limits  TROPONIN I  PRO B NATRIURETIC PEPTIDE  MAGNESIUM    Imaging Review Dg Chest Portable 1 View  09/07/2013   CLINICAL DATA:  Shortness of breath and fatigue, remote smoker, pacemaker  EXAM: PORTABLE CHEST - 1 VIEW  COMPARISON:  06/09/2013  FINDINGS: Left subclavian 2 lead pacer noted. Cardiomegaly present without associated edema or CHF. No focal pneumonia, collapse or consolidation. Negative for effusion or pneumothorax. Trachea midline. Bones appear osteopenic. Overall stable exam.  IMPRESSION: cardiomegaly without superimposed acute process.   Electronically Signed   By: Daryll Brod M.D.   On: 09/07/2013 09:19     EKG Interpretation   Date/Time:  Monday September 08 2013 04:26:05 EDT Ventricular Rate:  85 PR Interval:  331 QRS Duration: 104 QT Interval:  413 QTC Calculation: 491 R Axis:   36 Text Interpretation:  Sinus or ectopic atrial rhythm Multiform ventricular  premature complexes Prolonged PR interval Nonspecific repol abnormality,  diffuse leads ST depression V1-V3, suggest recording posterior leads  Confirmed by Anntionette Madkins  MD, Herson Prichard ([id number]) on 09/08/2013 4:50:46 AM      MDM   Final diagnoses:  Hypokalemia  Acute dyspnea  Chronic diastolic congestive heart failure  Dyspnea Acute Hypokalemia  Patient with  recurrent shortness of breath without known cause at this time. With significant dyspnea and diaphoresis concern for cardiac equivalent. Reviewed EKG showing mild ST depression anterior leads. Reviewed EKG with Dr. Gretta Arab at [facility] who felt there were fusion beats and not ischemia, he felt comfortable keeping the patient at [facility] for further evaluation. Patient asymptomatic on recheck. Discuss case with Dr. Shanon Brow who agreed with telemetry August. Potassium is low oral and IV ordered. Patient on Lasix.  The patients results and plan were reviewed and discussed.   Any x-rays performed were personally reviewed by myself.   Differential diagnosis were considered with the presenting HPI.  Medications  potassium chloride 10 mEq in 100 mL IVPB (10 mEq Intravenous New Bag/Given 09/08/13 0618)  potassium chloride SA (K-DUR,KLOR-CON) CR tablet 40 mEq (40 mEq Oral Given 09/08/13 0617)  aspirin chewable tablet 324 mg (not administered)  ondansetron (ZOFRAN) injection 4 mg (not administered)  Filed Vitals:   09/08/13 0421 09/08/13 0550  BP: 133/65 118/68  Pulse: 77 67  Temp: 97.6 F (36.4 C)   TempSrc: Oral   Resp: 18 16  Height: 5\' 6"  (1.676 m)   Weight: 188 lb (85.276 kg)   SpO2: 96% 95%    Admission/ observation were discussed with the admitting physician, patient and/or family and they are comfortable with the plan.      Mariea Clonts, MD 09/08/13 9753  Mariea Clonts, MD 09/08/13 [phone]

## 2013-09-08 NOTE — ED Notes (Addendum)
Per dr Caryn Section, pt aware will keep pt in ED until DDimer comes back and potassium/magnesium in. Awaiting magnesium from pharmacy. Pt ate breakfast

## 2013-09-08 NOTE — ED Notes (Signed)
[facility] called for appt for pt tomorrow or Wednesday per Dr Caryn Section request. Next appt is Thursday 330pm. Dr Caryn Section aware.

## 2013-09-08 NOTE — ED Notes (Signed)
Pt aware to split atenolol pill, take 3 tabs of potassium, appt with [facility] at 330 Thursday and to get BMET checked then also.

## 2013-09-08 NOTE — ED Notes (Signed)
Message left with Daughter, Donna's husband to have blood work thursday

## 2013-09-08 NOTE — Discharge Instructions (Signed)
Shortness of Breath Follow up with your cardiologist on Thursday as scheduled.  There is no evidence of heart failure, heart attack, blood clot, or pneumonia.  Return to the ED if you develop new or worsening symptoms. Shortness of breath means you have trouble breathing. It could also mean that you have a medical problem. You should get immediate medical care for shortness of breath. CAUSES   Not enough oxygen in the air such as with high altitudes or a smoke-filled room.  Certain lung diseases, infections, or problems.  Heart disease or conditions, such as angina or heart failure.  Low red blood cells (anemia).  Poor physical fitness, which can cause shortness of breath when you exercise.  Chest or back injuries or stiffness.  Being overweight.  Smoking.  Anxiety, which can make you feel like you are not getting enough air. DIAGNOSIS  Serious medical problems can often be found during your physical exam. Tests may also be done to determine why you are having shortness of breath. Tests may include:  Chest X-rays.  Lung function tests.  Blood tests.  An electrocardiogram (ECG).  An ambulatory electrocardiogram. An ambulatory ECG records your heartbeat patterns over a 24-hour period.  Exercise testing.  A transthoracic echocardiogram (TTE). During echocardiography, sound waves are used to evaluate how blood flows through your heart.  A transesophageal echocardiogram (TEE).  Imaging scans. Your health care provider may not be able to find a cause for your shortness of breath after your exam. In this case, it is important to have a follow-up exam with your health care provider as directed.  TREATMENT  Treatment for shortness of breath depends on the cause of your symptoms and can vary greatly. HOME CARE INSTRUCTIONS   Do not smoke. Smoking is a common cause of shortness of breath. If you smoke, ask for help to quit.  Avoid being around chemicals or things that may bother  your breathing, such as paint fumes and dust.  Rest as needed. Slowly resume your usual activities.  If medicines were prescribed, take them as directed for the full length of time directed. This includes oxygen and any inhaled medicines.  Keep all follow-up appointments as directed by your health care provider. SEEK MEDICAL CARE IF:   Your condition does not improve in the time expected.  You have a hard time doing your normal activities even with rest.  You have any new symptoms. SEEK IMMEDIATE MEDICAL CARE IF:   Your shortness of breath gets worse.  You feel light-headed, faint, or develop a cough not controlled with medicines.  You start coughing up blood.  You have pain with breathing.  You have chest pain or pain in your arms, shoulders, or abdomen.  You have a fever.  You are unable to walk up stairs or exercise the way you normally do. MAKE SURE YOU:  Understand these instructions.  Will watch your condition.  Will get help right away if you are not doing well or get worse. Document Released: 10/25/2000 Document Revised: 02/04/2013 Document Reviewed: 04/17/2011 [facility] Patient Information 2015 [place], [place]. This information is not intended to replace advice given to you by your health care provider. Make sure you discuss any questions you have with your health care provider.

## 2013-09-08 NOTE — ED Notes (Signed)
CRITICAL VALUE ALERT  Critical value received:  Potassium 2.7  Date of notification:  09/08/13  Time of notification:  3779  Critical value read back:Yes.    Nurse who received alert:  JMA  MD notified (1st page):  Dr. Reather Converse  Time of first page:  [phone]

## 2013-09-08 NOTE — ED Notes (Signed)
Pt appears anxious, breathing fast, breath sounds WDL. Pt's son in law states that the pt has been increasingly anxious over past week and has been seen multiple times for same complaint with no findings related to breathing difficulties.

## 2013-09-08 NOTE — ED Notes (Signed)
Discharge instructions reviewed with pt, questions answered. Pt verbalized understanding.  

## 2013-09-08 NOTE — Discharge Instructions (Signed)
Follow up with cardiology Thursday,  Increase your potassium to 60 meq a day.  Decrease your atenolol to 25 mg a day.

## 2013-09-08 NOTE — ED Notes (Signed)
Pt ambulated to bathroom and around nursing station. Pt O2 sat 97% when leaving the room and trended down to 92% with increased shortness of breath. Pt unable to complete a sentence due to SOB.

## 2013-09-08 NOTE — ED Provider Notes (Signed)
CSN: [id number]     Arrival date & time 09/08/13  1918 History  This chart was scribed for No att. providers found,  by Stacy Gardner, ED Scribe. The patient was seen in room APA01/APA01 and the patient's care was started at 7:29 PM.   Chief Complaint  Patient presents with  . Shortness of Breath     (Consider location/radiation/quality/duration/timing/severity/associated sxs/prior Treatment) Patient is a 79 y.o. female presenting with shortness of breath. The history is provided by medical records and the patient. No language interpreter was used.  Shortness of Breath Associated symptoms: no chest pain and no cough    HPI Comments: Kari Hall is a 79 y.o. female who presents to the Emergency Department complaining of SOB, onset today after cleaning her home. Pt was seen 3 am this morning for the same complaint.Pt reports feeling well when she left the ED. Pt was also seen two days ago for the same complaint. Pt reports having these reoccurring episodes for the past month.  Denies chest pain, back pain, abdominal pain, cough, and fever. Denies hx heart failure. She is currently taking Lasix and has not missed any of her doses. Pt does not smoke. Denies hx of anxiety. Denies any pain. No activity make the SOB better or worse.  She mentions when she worries about it the SOB is worse.  Pt lives at home alone.   Past Medical History  Diagnosis Date  . GERD (gastroesophageal reflux disease)   . Dyslipidemia   . Essential hypertension, benign   . Hypothyroidism   . IBS (irritable bowel syndrome)   . Diverticulitis   . Sinoatrial node dysfunction   . Chronic diastolic heart failure    Past Surgical History  Procedure Laterality Date  . Cholecystectomy    . Appendectomy    . Colectomy    . Ectopic pregnancy surgery    . Pacemaker placement     History reviewed. No pertinent family history. History  Substance Use Topics  . Smoking status: Former Smoker -- 0.50 packs/day for 45  years    Types: Cigarettes    Quit date: 02/24/1979  . Smokeless tobacco: Never Used  . Alcohol Use: No   OB History   Grav Para Term Preterm Abortions TAB SAB Ect Mult Living   3 3 3       3      Review of Systems  Respiratory: Positive for shortness of breath. Negative for cough.   Cardiovascular: Negative for chest pain.  Gastrointestinal: Negative for abdominal distention.  Musculoskeletal: Negative for back pain.  Hematological: Bruises/bleeds easily.  Psychiatric/Behavioral: Negative for self-injury.  All other systems reviewed and are negative.     Allergies  Review of patient's allergies indicates no known allergies.  Home Medications   Prior to Admission medications   Medication Sig Start Date End Date Taking? Authorizing Provider  aspirin EC 81 MG tablet Take 81 mg by mouth daily.   Yes Historical Provider, MD  atenolol (TENORMIN) 50 MG tablet Take 50 mg by mouth daily.     Yes Historical Provider, MD  furosemide (LASIX) 20 MG tablet Take 40 mg by mouth daily with lunch.  03/14/13  Yes Lendon Colonel, NP  levothyroxine (SYNTHROID, LEVOTHROID) 100 MCG tablet Take 100 mcg by mouth daily before breakfast.   Yes Historical Provider, MD  omeprazole (PRILOSEC) 20 MG capsule Take 20 mg by mouth daily.   Yes Historical Provider, MD  potassium chloride SA (K-DUR,KLOR-CON) 20 MEQ tablet Take 40  mEq by mouth daily.   Yes Historical Provider, MD   BP 132/71  Pulse 73  Resp 33  Ht 5\' 6"  (1.676 m)  Wt 180 lb (81.647 kg)  BMI 29.07 kg/m2  SpO2 97% Physical Exam  Nursing note and vitals reviewed. Constitutional: She is oriented to person, place, and time. She appears well-developed and well-nourished.  Speaking in full sentences.  Appears anxious.   HENT:  Head: Normocephalic and atraumatic.  Mouth/Throat: Oropharynx is clear and moist. No oropharyngeal exudate.  Eyes: Conjunctivae and EOM are normal. Pupils are equal, round, and reactive to light.  Neck: Normal range  of motion. Neck supple.  No meningismus.  Cardiovascular: Normal rate, regular rhythm, normal heart sounds and intact distal pulses.   No murmur heard. Pulmonary/Chest: Breath sounds normal. Tachypnea noted. No respiratory distress. She has no wheezes.  Abdominal: Soft. There is no tenderness. There is no rebound and no guarding.  Musculoskeletal: Normal range of motion. She exhibits no edema and no tenderness.  Neurological: She is alert and oriented to person, place, and time. No cranial nerve deficit. She exhibits normal muscle tone. Coordination normal.  No ataxia on finger to nose bilaterally. No pronator drift. 5/5 strength throughout. CN 2-12 intact. Negative Romberg. Equal grip strength. Sensation intact. Gait is normal.   Skin: Skin is warm.  Psychiatric: She has a normal mood and affect. Her behavior is normal.    ED Course  Procedures (including critical care time) DIAGNOSTIC STUDIES: Oxygen Saturation is 99% on room air, normal by my interpretation.    COORDINATION OF CARE:  2:04 AM Discussed course of care with pt . Pt understands and agrees.   Labs Review Labs Reviewed  CBC WITH DIFFERENTIAL - Abnormal; Notable for the following:    Platelets 128 (*)    Monocytes Relative 13 (*)    All other components within normal limits  COMPREHENSIVE METABOLIC PANEL - Abnormal; Notable for the following:    Potassium 3.6 (*)    Glucose, Bld 126 (*)    Creatinine, Ser 1.19 (*)    Alkaline Phosphatase 119 (*)    GFR calc non Af Amer 43 (*)    GFR calc Af Amer 50 (*)    All other components within normal limits  URINALYSIS, ROUTINE W REFLEX MICROSCOPIC - Abnormal; Notable for the following:    Specific Gravity, Urine <1.005 (*)    Leukocytes, UA SMALL (*)    All other components within normal limits  D-DIMER, QUANTITATIVE - Abnormal; Notable for the following:    D-Dimer, Quant 0.52 (*)    All other components within normal limits  URINE MICROSCOPIC-ADD ON - Abnormal; Notable  for the following:    Squamous Epithelial / LPF FEW (*)    All other components within normal limits  TROPONIN I  PRO B NATRIURETIC PEPTIDE  TROPONIN I    Imaging Review Dg Chest 2 View  09/08/2013   CLINICAL DATA:  Shortness of breath. Chronic diastolic heart failure. Hypertension. Former smoker.  EXAM: CHEST  2 VIEW  COMPARISON:  Chest radiograph 09/07/2013  FINDINGS: There is a left chest wall dual lead pacemaker with leads in the expected location of the right atrium and right ventricle. Mild cardiomegaly is stable. Mediastinal and hilar contours are within normal limits. Pulmonary vascularity is normal patient is slightly kyphotic in position. The lungs are clear and appear emphysematous. No airspace disease, effusion, or pneumothorax.  Diffuse osteopenia of the bones.  No acute bony abnormality.  IMPRESSION: Suspect  at emphysema.  No acute superimposed abnormality.  Stable mild cardiomegaly with dual lead pacemaker present.   Electronically Signed   By: Curlene Dolphin M.D.   On: 09/08/2013 20:22   Ct Angio Chest Pe W/cm &/or Wo Cm  09/08/2013   CLINICAL DATA:  Shortness of breath for 3 days.  EXAM: CT ANGIOGRAPHY CHEST WITH CONTRAST  TECHNIQUE: Multidetector CT imaging of the chest was performed using the standard protocol during bolus administration of intravenous contrast. Multiplanar CT image reconstructions and MIPs were obtained to evaluate the vascular anatomy.  CONTRAST:  29mL OMNIPAQUE IOHEXOL 350 MG/ML SOLN  COMPARISON:  None.  FINDINGS: Technically adequate study with good opacification of the central and segmental pulmonary arteries. No focal filling defects are demonstrated. No evidence of significant pulmonary embolus.  Normal heart size. Coronary artery and aortic calcifications. Ascending thoracic aorta is dilated with maximal AP diameter of about 4 cm. No dissection. Mural plaque formation demonstrated throughout the aortic arch and descending aorta. Great vessel origins are patent.  Esophagus is air-filled but not significantly distended. No significant lymphadenopathy in the chest.  Scattered emphysematous changes in the lungs. No focal consolidation or airspace disease. No pneumothorax. No pleural effusion. Airways appear patent.  Visualized portions of the upper abdominal organs are grossly unremarkable. Degenerative changes in the thoracic spine. No destructive bone lesions are appreciated.  Review of the MIP images confirms the above findings.  IMPRESSION: No evidence of significant pulmonary embolus. Emphysematous changes in the lungs. Ascending aortic dilatation with AP diameter of 4 cm.   Electronically Signed   By: Lucienne Capers M.D.   On: 09/08/2013 21:24   Dg Chest Portable 1 View  09/07/2013   CLINICAL DATA:  Shortness of breath and fatigue, remote smoker, pacemaker  EXAM: PORTABLE CHEST - 1 VIEW  COMPARISON:  06/09/2013  FINDINGS: Left subclavian 2 lead pacer noted. Cardiomegaly present without associated edema or CHF. No focal pneumonia, collapse or consolidation. Negative for effusion or pneumothorax. Trachea midline. Bones appear osteopenic. Overall stable exam.  IMPRESSION: cardiomegaly without superimposed acute process.   Electronically Signed   By: Daryll Brod M.D.   On: 09/07/2013 09:19     EKG Interpretation   Date/Time:  Monday September 08 2013 19:37:37 EDT Ventricular Rate:  65 PR Interval:  243 QRS Duration: 85 QT Interval:  439 QTC Calculation: 456 R Axis:   4 Text Interpretation:  Sinus rhythm Prolonged PR interval No significant  change was found Confirmed by Wyvonnia Dusky  MD, Omeed Osuna [phone]) on 09/08/2013  7:52:12 PM      MDM   Final diagnoses:  Dyspnea   Third visit in the past 2 days for episodic shortness of breath. Denies any chest pain, cough or fever. Workup was negative on 2 previous visits. Patient states she felt better when she left. There was no evidence of heart failure, MI or PNA.  EKG unchanged. Lungs clear to auscultation.  Oxygenation 100%. Troponin negative. BNP within normal range. Chest x-Morino negative. D-dimer elevated. CT negative for PE.  No evidence of MI, CHF, PE, PNA, PTX.  Patient feeling better.  She ambulated and had no dyspnea, and no CP. No hypoxia. Troponin negative x 2. EKG unchanged. ASA level negative. Patient's son feels that the patient has some anxiety and attention seeking behavior. He lives next door to her and will watch her tonight.  She is feeling back to baseline and asking to go home.  She has cardiology follow up in 2 days.  She  will take potassium and reduce her atenolol dose as outlined by Dr. Caryn Section earlier today. K improved to 3.6 today.  BP 132/71  Pulse 73  Resp 33  Ht 5\' 6"  (1.676 m)  Wt 180 lb (81.647 kg)  BMI 29.07 kg/m2  SpO2 97%  I personally performed the services described in this documentation, which was scribed in my presence. The recorded information has been reviewed and is accurate.  Ezequiel Essex, MD 09/09/13 [phone]

## 2013-09-08 NOTE — ED Notes (Signed)
Patient reports "breathing problems" mostly at nights x 2 weeks approximately. States she wakes up multiple times at night and feels like she has rapid breathing. Seen here yesterday morning for same. No acute distress noted at this time. Denies any symptoms or shortness of breath right now.

## 2013-09-09 ENCOUNTER — Telehealth: Payer: Self-pay | Admitting: *Deleted

## 2013-09-09 LAB — SALICYLATE LEVEL

## 2013-09-09 NOTE — Telephone Encounter (Signed)
I spoke with Verdis Frederickson at [facility] and clarified Dr.fishere's ED note that pt is to take decreased dose atenolol (25 mg) and increase Potassium to ( 60 meq daily) until re-evaluated  by K.lawrence NP on 09/11/13

## 2013-09-09 NOTE — Telephone Encounter (Signed)
Kari Hall WITH [facility] IS NEEDING TO GO OVER MEDICATIONS ON PATIENT

## 2013-09-11 ENCOUNTER — Ambulatory Visit (INDEPENDENT_AMBULATORY_CARE_PROVIDER_SITE_OTHER): Payer: Commercial Managed Care - HMO | Admitting: Adult Health

## 2013-09-11 ENCOUNTER — Encounter: Payer: Self-pay | Admitting: Adult Health

## 2013-09-11 VITALS — BP 130/90 | HR 52 | Ht 66.0 in | Wt 188.0 lb

## 2013-09-11 DIAGNOSIS — I5032 Chronic diastolic (congestive) heart failure: Secondary | ICD-10-CM

## 2013-09-11 DIAGNOSIS — R0600 Dyspnea, unspecified: Secondary | ICD-10-CM

## 2013-09-11 DIAGNOSIS — I509 Heart failure, unspecified: Secondary | ICD-10-CM

## 2013-09-11 DIAGNOSIS — R0609 Other forms of dyspnea: Secondary | ICD-10-CM

## 2013-09-11 DIAGNOSIS — R0989 Other specified symptoms and signs involving the circulatory and respiratory systems: Secondary | ICD-10-CM

## 2013-09-11 DIAGNOSIS — R0602 Shortness of breath: Secondary | ICD-10-CM

## 2013-09-11 NOTE — Assessment & Plan Note (Signed)
I discussed with her the need to run her air conditioning, as the color or help her to breathe better, she states she uses a fan but it has not helped. She has 3 air-conditioners in her home. I have asked her to have her son-in-law change the filters on her air conditioning, and have her use her air conditioner in her bedroom and in the main part of the house where she normally resides. I will also advise her to get a dehumidifier in place it in her bedroom and in her main living area to assist with humidity during this hot summer season. She verbalizes understanding and is willing to begin to use air conditioning and will purchase a dehumidifier.  I will refer her to Dr. Luan Pulling, pulmonologist, for his assessment and possibility of PFTs as he deems necessary.

## 2013-09-11 NOTE — Progress Notes (Deleted)
Name: Kari Hall    DOB: February 07, 1935  Age: 79 y.o.  MR#: [id number]       PCP:  Glo Herring., MD      Insurance: Payor: [employer]ARE / Plan: [employer] MED[employer]ARE CHOICE PPO / Product Type: *No Product type* /   CC:    Chief Complaint  Patient presents with  . Congestive Heart Failure  . Hypertension    VS Filed Vitals:   09/11/13 1542  BP: 130/90  Pulse: 52  Height: 5\' 6"  (1.676 m)  Weight: 188 lb (85.276 kg)  SpO2: 99%    Weights Current Weight  09/11/13 188 lb (85.276 kg)  09/08/13 180 lb (81.647 kg)  09/08/13 188 lb (85.276 kg)    Blood Pressure  BP Readings from Last 3 Encounters:  09/11/13 130/90  09/08/13 132/71  09/08/13 136/63     Admit date:  (Not on file) Last encounter with RMR:  Visit date not found   Allergy Review of patient's allergies indicates no known allergies.  Current Outpatient Prescriptions  Medication Sig Dispense Refill  . aspirin EC 81 MG tablet Take 81 mg by mouth daily.      Marland Kitchen atenolol (TENORMIN) 50 MG tablet Take 50 mg by mouth daily.        . furosemide (LASIX) 20 MG tablet Take 40 mg by mouth daily with lunch.       . levothyroxine (SYNTHROID, LEVOTHROID) 100 MCG tablet Take 100 mcg by mouth daily before breakfast.      . omeprazole (PRILOSEC) 20 MG capsule Take 20 mg by mouth daily.      . potassium chloride SA (K-DUR,KLOR-CON) 20 MEQ tablet Take 40 mEq by mouth daily.      . [DISCONTINUED] ezetimibe-simvastatin (VYTORIN) 10-40 MG per tablet Take 1 tablet by mouth at bedtime.         No current facility-administered medications for this visit.    Discontinued Meds:   There are no discontinued medications.  Patient Active Problem List   Diagnosis Date Noted  . Acute dyspnea 09/08/2013  . Thrombocytopenia 09/08/2013  . Hypokalemia 09/08/2013  . Shortness of breath 07/09/2013  . Chronic diastolic congestive heart failure 05/04/2011  . Sinoatrial node dysfunction   . Edema 08/24/2010  . IBS 03/15/2010  . EPIGASTRIC PAIN  03/15/2010  . Cardiac pacemaker in situ 06/09/2009  . HYPOTHYROIDISM 06/02/2009  . DYSLIPIDEMIA 06/02/2009  . TOBACCO ABUSE 06/02/2009  . HYPERTENSION 06/02/2009    LABS    Component Value Date/Time   NA 141 09/08/2013 1935   NA 144 09/08/2013 0506   NA 143 09/07/2013 0850   K 3.6* 09/08/2013 1935   K 3.5* 09/08/2013 0901   K 2.7* 09/08/2013 0506   CL 101 09/08/2013 1935   CL 101 09/08/2013 0506   CL 100 09/07/2013 0850   CO2 25 09/08/2013 1935   CO2 28 09/08/2013 0506   CO2 28 09/07/2013 0850   GLUCOSE 126* 09/08/2013 1935   GLUCOSE 122* 09/08/2013 0506   GLUCOSE 104* 09/07/2013 0850   BUN 23 09/08/2013 1935   BUN 24* 09/08/2013 0506   BUN 23 09/07/2013 0850   CREATININE 1.19* 09/08/2013 1935   CREATININE 1.16* 09/08/2013 0506   CREATININE 1.16* 09/07/2013 0850   CREATININE 1.15* 07/21/2013 0843   CREATININE 1.33* 07/09/2013 0903   CREATININE 1.06 02/28/2013 0855   CALCIUM 9.2 09/08/2013 1935   CALCIUM 8.8 09/08/2013 0506   CALCIUM 9.2 09/07/2013 0850   GFRNONAA 43* 09/08/2013 1935  GFRNONAA 44* 09/08/2013 0506   GFRNONAA 44* 09/07/2013 0850   GFRAA 50* 09/08/2013 1935   GFRAA 51* 09/08/2013 0506   GFRAA 51* 09/07/2013 0850   CMP     Component Value Date/Time   NA 141 09/08/2013 1935   K 3.6* 09/08/2013 1935   CL 101 09/08/2013 1935   CO2 25 09/08/2013 1935   GLUCOSE 126* 09/08/2013 1935   BUN 23 09/08/2013 1935   CREATININE 1.19* 09/08/2013 1935   CREATININE 1.15* 07/21/2013 0843   CALCIUM 9.2 09/08/2013 1935   PROT 6.8 09/08/2013 1935   ALBUMIN 3.5 09/08/2013 1935   AST 16 09/08/2013 1935   ALT 14 09/08/2013 1935   ALKPHOS 119* 09/08/2013 1935   BILITOT 0.7 09/08/2013 1935   GFRNONAA 43* 09/08/2013 1935   GFRAA 50* 09/08/2013 1935       Component Value Date/Time   WBC 6.6 09/08/2013 1935   WBC 5.6 09/08/2013 0506   WBC 5.2 09/07/2013 0850   HGB 13.8 09/08/2013 1935   HGB 13.2 09/08/2013 0506   HGB 14.6 09/07/2013 0850   HCT 39.4 09/08/2013 1935   HCT 37.5 09/08/2013 0506   HCT 41.7 09/07/2013  0850   MCV 90.2 09/08/2013 1935   MCV 89.5 09/08/2013 0506   MCV 90.3 09/07/2013 0850    Lipid Panel  No results found for this basename: chol, trig, hdl, cholhdl, vldl, ldlcalc    ABG No results found for this basename: phart, pco2, pco2art, po2, po2art, hco3, tco2, acidbasedef, o2sat     Lab Results  Component Value Date   TSH 0.352 06/09/2013   BNP (last 3 results)  Recent Labs  09/07/13 0850 09/08/13 0506 09/08/13 1935  PROBNP 180.7 226.7 278.3   Cardiac Panel (last 3 results)  Recent Labs  09/08/13 1935 09/08/13 2142  TROPONINI <0.30 <0.30    Iron/TIBC/Ferritin/ %Sat No results found for this basename: iron, tibc, ferritin, ironpctsat     EKG Orders placed during the hospital encounter of 09/08/13  . ED EKG  . ED EKG  . EKG 12-LEAD  . EKG 12-LEAD     Prior Assessment and Plan Problem List as of 09/11/2013     Cardiovascular and Mediastinum   HYPERTENSION   Last Assessment & Plan   07/09/2013 Office Visit Written 07/09/2013  8:36 AM by Satira Sark, MD     Blood pressure elevated today. Lasix is being increased. May benefit from ACE inhibitor or ARB particularly if she has diastolic dysfunction. Defer additional treatment until followup echocardiogram and lab work.    Sinoatrial node dysfunction   Last Assessment & Plan   07/09/2013 Office Visit Written 07/09/2013  8:35 AM by Satira Sark, MD     Status post Medtronic pacemaker, normal function by most recent assessment. ECG today is normal. She is followed by Dr. Lovena Le.    Chronic diastolic congestive heart failure   Last Assessment & Plan   07/09/2013 Office Visit Written 07/09/2013  8:35 AM by Satira Sark, MD     Possible diagnosis. Last echocardiogram did not comment on diastolic function. Hopefully this will be further clarified with repeat study.      Digestive   IBS     Endocrine   HYPOTHYROIDISM   Last Assessment & Plan   09/08/2010 Office Visit Written 09/08/2010 11:25 AM by  Lendon Colonel, NP     She is followed by Larene Pickett for this. Labs have been recently drawn, she states that she was told they  were normal.      Other   Edema   Last Assessment & Plan   03/14/2013 Office Visit Written 03/14/2013  1:58 PM by Lendon Colonel, NP     This has resolved.    Thrombocytopenia   DYSLIPIDEMIA   Last Assessment & Plan   04/08/2012 Office Visit Written 04/08/2012  1:19 PM by Lendon Colonel, NP     Will request recent labs from Dr. Gerarda Fraction for our records.    TOBACCO ABUSE   Cardiac pacemaker in situ   Last Assessment & Plan   05/02/2013 Office Visit Written 05/02/2013  9:16 AM by Evans Lance, MD     Her Medtronic DDD PM is working normally. Will recheck in several months.    EPIGASTRIC PAIN   Shortness of breath   Last Assessment & Plan   07/09/2013 Office Visit Written 07/09/2013  8:34 AM by Satira Sark, MD     Intermittent, also reported orthopnea over the last several weeks. Her weight is coming down following an increase in Lasix made two weeks ago by her primary care provider, leg edema also improving. Plan at this time is to have her increase Lasix to 60 mg daily, take her dose at mid day or early afternoon rather at bedtime, continue potassium supplements. A followup echocardiogram will be obtained to reassess cardiac structure and function. She will keep her visit with Dr. Bronson Ing on June 10 and have a followup BMET and pro-BNP at that time.    Acute dyspnea   Hypokalemia       Imaging: Dg Chest 2 View  09/08/2013   CLINICAL DATA:  Shortness of breath. Chronic diastolic heart failure. Hypertension. Former smoker.  EXAM: CHEST  2 VIEW  COMPARISON:  Chest radiograph 09/07/2013  FINDINGS: There is a left chest wall dual lead pacemaker with leads in the expected location of the right atrium and right ventricle. Mild cardiomegaly is stable. Mediastinal and hilar contours are within normal limits. Pulmonary vascularity is normal patient is  slightly kyphotic in position. The lungs are clear and appear emphysematous. No airspace disease, effusion, or pneumothorax.  Diffuse osteopenia of the bones.  No acute bony abnormality.  IMPRESSION: Suspect at emphysema.  No acute superimposed abnormality.  Stable mild cardiomegaly with dual lead pacemaker present.   Electronically Signed   By: Curlene Dolphin M.D.   On: 09/08/2013 20:22   Ct Angio Chest Pe W/cm &/or Wo Cm  09/08/2013   CLINICAL DATA:  Shortness of breath for 3 days.  EXAM: CT ANGIOGRAPHY CHEST WITH CONTRAST  TECHNIQUE: Multidetector CT imaging of the chest was performed using the standard protocol during bolus administration of intravenous contrast. Multiplanar CT image reconstructions and MIPs were obtained to evaluate the vascular anatomy.  CONTRAST:  65mL OMNIPAQUE IOHEXOL 350 MG/ML SOLN  COMPARISON:  None.  FINDINGS: Technically adequate study with good opacification of the central and segmental pulmonary arteries. No focal filling defects are demonstrated. No evidence of significant pulmonary embolus.  Normal heart size. Coronary artery and aortic calcifications. Ascending thoracic aorta is dilated with maximal AP diameter of about 4 cm. No dissection. Mural plaque formation demonstrated throughout the aortic arch and descending aorta. Great vessel origins are patent. Esophagus is air-filled but not significantly distended. No significant lymphadenopathy in the chest.  Scattered emphysematous changes in the lungs. No focal consolidation or airspace disease. No pneumothorax. No pleural effusion. Airways appear patent.  Visualized portions of the upper abdominal organs are grossly unremarkable.  Degenerative changes in the thoracic spine. No destructive bone lesions are appreciated.  Review of the MIP images confirms the above findings.  IMPRESSION: No evidence of significant pulmonary embolus. Emphysematous changes in the lungs. Ascending aortic dilatation with AP diameter of 4 cm.    Electronically Signed   By: Lucienne Capers M.D.   On: 09/08/2013 21:24   Dg Chest Portable 1 View  09/07/2013   CLINICAL DATA:  Shortness of breath and fatigue, remote smoker, pacemaker  EXAM: PORTABLE CHEST - 1 VIEW  COMPARISON:  06/09/2013  FINDINGS: Left subclavian 2 lead pacer noted. Cardiomegaly present without associated edema or CHF. No focal pneumonia, collapse or consolidation. Negative for effusion or pneumothorax. Trachea midline. Bones appear osteopenic. Overall stable exam.  IMPRESSION: cardiomegaly without superimposed acute process.   Electronically Signed   By: Daryll Brod M.D.   On: 09/07/2013 09:19

## 2013-09-11 NOTE — Patient Instructions (Signed)
Your physician recommends that you schedule a follow-up appointment in: 6-8 weeks  You have been referred to Dr Luan Pulling for shortness of breath.  Your physician recommends you get a Dehumidifier.

## 2013-09-11 NOTE — Assessment & Plan Note (Signed)
No evidence of fluid retention. She appears well compensated.

## 2013-09-11 NOTE — Progress Notes (Addendum)
HPI: Kari Hall is a 79 year old female patient of Dr. Bronson Ing last seen in June presenting for ongoing assessment and management of chronic diastolic heart failure, pacemaker in situ, hypertension, with history of hypothyroidism and irritable bowel syndrome. The patient was recently seen in the emergency room on 09/08/2013, with chief complaint of shortness of breath. Evaluation in the emergency room did not reveal evidence of CHF, PE, but she was found to be mildly hypokalemic at 3.2 on admission. This was repleted and she is here for post ER followup. Her potassium was increased to 60 mEq daily, and her atenolol was decreased to 25 mg daily. She will need a followup BMET.  Kari Hall comes today with continued complaints of shortness of breath. The patient states is usually related to being at home in her trailer. She states that she does not repair conditioning and is usually very hot in her trailer. Usually experiencing shortness of breath at night when lying down. She wishes to have medication to help with her breathing status. She has not had PFTs done or had a pulmonary evaluation. She is very anxious concerning her breathing status. She says it gets better when she gets in a building with cool air.   No Known Allergies  Current Outpatient Prescriptions  Medication Sig Dispense Refill  . aspirin EC 81 MG tablet Take 81 mg by mouth daily.      Marland Kitchen atenolol (TENORMIN) 50 MG tablet Take 50 mg by mouth daily.        . furosemide (LASIX) 20 MG tablet Take 40 mg by mouth daily with lunch.       . levothyroxine (SYNTHROID, LEVOTHROID) 100 MCG tablet Take 100 mcg by mouth daily before breakfast.      . omeprazole (PRILOSEC) 20 MG capsule Take 20 mg by mouth daily.      . potassium chloride SA (K-DUR,KLOR-CON) 20 MEQ tablet Take 40 mEq by mouth daily.      . [DISCONTINUED] ezetimibe-simvastatin (VYTORIN) 10-40 MG per tablet Take 1 tablet by mouth at bedtime.         No current  facility-administered medications for this visit.    Past Medical History  Diagnosis Date  . GERD (gastroesophageal reflux disease)   . Dyslipidemia   . Essential hypertension, benign   . Hypothyroidism   . IBS (irritable bowel syndrome)   . Diverticulitis   . Sinoatrial node dysfunction   . Chronic diastolic heart failure     Past Surgical History  Procedure Laterality Date  . Cholecystectomy    . Appendectomy    . Colectomy    . Ectopic pregnancy surgery    . Pacemaker placement      ROS: Review of systems complete and found to be negative unless listed above  PHYSICAL EXAM BP 130/90  Pulse 52  Ht 5\' 6"  (1.676 m)  Wt 188 lb (85.276 kg)  BMI 30.36 kg/m2  SpO2 99% General: Well developed, well nourished, in no acute distress Head: Eyes PERRLA, No xanthomas.   Normal cephalic and atramatic  Lungs: Clear bilaterally to auscultation and percussion. Heart: HRRR S1 S2, without MRG.  Pulses are 2+ & equal.            No carotid bruit. No JVD.  No abdominal bruits. No femoral bruits. Abdomen: Bowel sounds are positive, abdomen soft and non-tender without masses or                  Hernia's noted. Msk:  Back normal, normal gait. Normal strength and tone for age. Extremities: No clubbing, cyanosis or edema.  DP +1 Neuro: Alert and oriented X 3. Psych:  Good affect, responds appropriately  ASSESSMENT AND PLAN

## 2013-09-24 ENCOUNTER — Encounter: Payer: Self-pay | Admitting: Internal Medicine

## 2013-10-01 ENCOUNTER — Encounter: Payer: Self-pay | Admitting: Gastroenterology

## 2013-10-01 ENCOUNTER — Ambulatory Visit (INDEPENDENT_AMBULATORY_CARE_PROVIDER_SITE_OTHER): Payer: Medicare PPO | Admitting: Gastroenterology

## 2013-10-01 ENCOUNTER — Other Ambulatory Visit: Payer: Self-pay | Admitting: Gastroenterology

## 2013-10-01 ENCOUNTER — Ambulatory Visit: Payer: Commercial Managed Care - HMO | Admitting: Gastroenterology

## 2013-10-01 ENCOUNTER — Telehealth: Payer: Self-pay

## 2013-10-01 ENCOUNTER — Telehealth: Payer: Self-pay | Admitting: Gastroenterology

## 2013-10-01 VITALS — BP 129/76 | HR 71 | Temp 98.1°F | Ht 66.0 in | Wt 194.0 lb

## 2013-10-01 DIAGNOSIS — R110 Nausea: Secondary | ICD-10-CM

## 2013-10-01 DIAGNOSIS — R109 Unspecified abdominal pain: Secondary | ICD-10-CM | POA: Insufficient documentation

## 2013-10-01 DIAGNOSIS — K921 Melena: Secondary | ICD-10-CM | POA: Insufficient documentation

## 2013-10-01 DIAGNOSIS — R0602 Shortness of breath: Secondary | ICD-10-CM

## 2013-10-01 DIAGNOSIS — J441 Chronic obstructive pulmonary disease with (acute) exacerbation: Secondary | ICD-10-CM

## 2013-10-01 LAB — CBC WITH DIFFERENTIAL/PLATELET
BASOS ABS: 0 10*3/uL (ref 0.0–0.1)
Basophils Relative: 0 % (ref 0–1)
EOS PCT: 3 % (ref 0–5)
Eosinophils Absolute: 0.2 10*3/uL (ref 0.0–0.7)
HCT: 38.1 % (ref 36.0–46.0)
Hemoglobin: 12.8 g/dL (ref 12.0–15.0)
LYMPHS PCT: 20 % (ref 12–46)
Lymphs Abs: 1.2 10*3/uL (ref 0.7–4.0)
MCH: 30.5 pg (ref 26.0–34.0)
MCHC: 33.6 g/dL (ref 30.0–36.0)
MCV: 90.9 fL (ref 78.0–100.0)
Monocytes Absolute: 0.7 10*3/uL (ref 0.1–1.0)
Monocytes Relative: 12 % (ref 3–12)
NEUTROS PCT: 65 % (ref 43–77)
Neutro Abs: 4 10*3/uL (ref 1.7–7.7)
PLATELETS: 170 10*3/uL (ref 150–400)
RBC: 4.19 MIL/uL (ref 3.87–5.11)
RDW: 14.1 % (ref 11.5–15.5)
WBC: 6.2 10*3/uL (ref 4.0–10.5)

## 2013-10-01 NOTE — Assessment & Plan Note (Addendum)
79 y/o female who is difficult historian. This is our first encounter but she seemed somewhat forgetful and had poor understanding of her numerous medical conditions. She c/o melena, nausea but denies abdominal pain (at this time). Discussed consideration of EGD and colonoscopy (last one 2003). She was not sure if she wanted to have this done yet. She is very concerned about her chronic SOB. I have offered her referral to see Dr. Luan Pulling (CT findings show emphysema). I will also check CBC, ifobt. She gave permission to speak with her son regarding her condition. Unfortunately she could not remember his number. I provided her with my number to have him call me. We will also request [facility] referral. Continue omeprazole for now.

## 2013-10-01 NOTE — Patient Instructions (Signed)
1. Please have your blood work done today. 2. Collect stool specimen and return to our office as soon as possible. 3. We have started referral process to lung specialist for your shortness of breath. 4. I am also going to start a referral process for someone to help you with your medications.  5. Please ask your son to call me, Neil Crouch, at [phone]. 6. We need to get your scheduled for a colonoscopy and upper endoscopy in the near future to evaluate your black stools, nausea, and abdominal pain.

## 2013-10-01 NOTE — Telephone Encounter (Signed)
I called and spoke to Marcie Bal at Dr. Nevin Bloodgood office Regarding Kari Hall needing a [facility] referral for home management (ASAP) and a Pulmonary Referral to Dr. Luan Pulling. I faxed over our recommendations as well has office note and and Marcie Bal stated that she would get to work on both referrals.

## 2013-10-01 NOTE — Telephone Encounter (Signed)
Twenty-five minute conversation with Kari Hall, son about patient with patient's approval.   They have also noted change in behavior and memory issues over the past few weeks. She as appointment upcoming with psychiatrist.  Saw Dr. Luan Pulling one week ago for SOB. Panic attack while there, given Ativan. Given RX for prn use. Appt with Dr. Luan Pulling tomorrow.  PFTs done.  Has not addressed memory issues with PCP yet. They want to address with Dr. Luan Pulling tomorrow. Suggested they not let her drive for now until we can make sure it is safe.     Went to [facility] ER several times. Didn't even stay lon enough for results. Calls EMS all the time.    Son states she will c/o abd pain, diarrhea. Son thinks she said she had black stools b/c she read side effects about medication and picked up on it. Lot of anxiety when she is by herself.   He is interested in pursuing colonoscopy and upper endoscopy with her to rule out GI etiology. I requested he make sure she completed labs and ifobt we arranged for today.     Darius Bump, please cal patient to schedule EGD/TCS with RMR.

## 2013-10-01 NOTE — Progress Notes (Signed)
Primary Care Physician:  Glo Herring., MD  Primary Gastroenterologist:  Garfield Cornea, MD   Chief Complaint  Patient presents with  . Abdominal Pain  . Nausea    HPI:  Kari Hall is a 79 y.o. female here at the request of Dr. Bronson Ing for further evaluation of abdominal pain and nausea. Patient presented by herself today. She gives primary complaint of SOB for several months and wanting answers. Inhaler helps some. I see where she has been referred to Dr. Luan Pulling by cardiology for SOB but patient reports she has not been seen yet. She denies heartburn. Appetite okay. Some nausea but no vomiting. No weight loss. She reports stools are black within past couple of week. Fecal incontinence at times. Stools are hard to lose. Small stool at least daily. Denies abdominal pain now. She doesn't remember if she had abdominal pain before. Doesn't seem to have good understanding of her medical conditions and somewhat forgetful. Able to report all of her meds however and what they were for after questioning. Surgery list obtained from medical record and consistent with scars.   Current Outpatient Prescriptions  Medication Sig Dispense Refill  . aspirin EC 81 MG tablet Take 81 mg by mouth daily.      Marland Kitchen LORazepam (ATIVAN) 0.5 MG tablet       . PROAIR HFA 108 (90 BASE) MCG/ACT inhaler       . atenolol (TENORMIN) 50 MG tablet Take 50 mg by mouth daily.        . furosemide (LASIX) 20 MG tablet Take 40 mg by mouth daily with lunch.       . levothyroxine (SYNTHROID, LEVOTHROID) 100 MCG tablet Take 100 mcg by mouth daily before breakfast.      . omeprazole (PRILOSEC) 20 MG capsule Take 20 mg by mouth daily.      . potassium chloride SA (K-DUR,KLOR-CON) 20 MEQ tablet Take 40 mEq by mouth daily.      . [DISCONTINUED] ezetimibe-simvastatin (VYTORIN) 10-40 MG per tablet Take 1 tablet by mouth at bedtime.         No current facility-administered medications for this visit.    Allergies as of 10/01/2013   . (No Known Allergies)    Past Medical History  Diagnosis Date  . GERD (gastroesophageal reflux disease)   . Dyslipidemia   . Essential hypertension, benign   . Hypothyroidism   . IBS (irritable bowel syndrome)   . Diverticulitis   . Sinoatrial node dysfunction   . Chronic diastolic heart failure   . Pacemaker     Past Surgical History  Procedure Laterality Date  . Cholecystectomy    . Appendectomy    . Colectomy      secondary to diverticulitis  . Ectopic pregnancy surgery    . Pacemaker placement    . Abdominal hysterectomy    . Colonoscopy  2003    Dr. Arnoldo Morale: few scattered diverticula in transverse and descending colon  . ?thyroid surgery?      Family History  Problem Relation Age of Onset  . Stroke Mother   . Emphysema Father   . Colon cancer Neg Hx     History   Social History  . Marital Status: Divorced    Spouse Name: N/A    Number of Children: 3  . Years of Education: N/A   Occupational History  .     Social History Main Topics  . Smoking status: Former Smoker -- 0.50 packs/day for 45 years  Types: Cigarettes    Quit date: 02/24/1979  . Smokeless tobacco: Never Used  . Alcohol Use: No  . Drug Use: No  . Sexual Activity: No   Other Topics Concern  . Not on file   Social History Narrative  . No narrative on file      ROS:  General: Negative for anorexia, weight loss, fever, chills, fatigue, weakness. Eyes: Negative for vision changes.  ENT: Negative for hoarseness, difficulty swallowing , nasal congestion. CV: Negative for chest pain, angina, palpitations, peripheral edema.  Respiratory: Negative for dyspnea at rest, cough, sputum, wheezing. C/O SOB/DOE GI: See history of present illness. GU:  Negative for dysuria, hematuria, urinary incontinence, urinary frequency, nocturnal urination.  MS: Negative for joint pain, low back pain.  Derm: Negative for rash or itching.  Neuro: Negative for weakness, abnormal sensation, seizure,  frequent headaches, memory loss, confusion.  Psych: Negative for anxiety, depression, suicidal ideation, hallucinations.  Endo: Negative for unusual weight change.  Heme: Negative for bruising or bleeding. Allergy: Negative for rash or hives.    Physical Examination:  BP 129/76  Pulse 71  Temp(Src) 98.1 F (36.7 C) (Oral)  Ht 5\' 6"  (1.676 m)  Wt 194 lb (87.998 kg)  BMI 31.33 kg/m2   General: Well-nourished, well-developed in no acute distress.  Head: Normocephalic, atraumatic.   Eyes: Conjunctiva pale, no icterus. Mouth: Oropharyngeal mucosa moist and pink , no lesions erythema or exudate. Neck: Supple without thyromegaly, masses, or lymphadenopathy.  Lungs: Clear to auscultation bilaterally.  Heart: Regular rate and rhythm, no murmurs rubs or gallops.  Abdomen: Bowel sounds are normal, nontender, nondistended, no hepatosplenomegaly or masses, no abdominal bruits or    hernia , no rebound or guarding.   Rectal: not performed Extremities: trace edema. No clubbing or deformities.  Neuro: Alert and oriented x 4 , grossly normal neurologically.  Skin: Warm and dry, no rash or jaundice.   Psych: Alert and cooperative, normal mood and affect.  Labs: Lab Results  Component Value Date   WBC 6.6 09/08/2013   HGB 13.8 09/08/2013   HCT 39.4 09/08/2013   MCV 90.2 09/08/2013   PLT 128* 09/08/2013   Lab Results  Component Value Date   CREATININE 1.19* 09/08/2013   BUN 23 09/08/2013   NA 141 09/08/2013   K 3.6* 09/08/2013   CL 101 09/08/2013   CO2 25 09/08/2013   Lab Results  Component Value Date   ALT 14 09/08/2013   AST 16 09/08/2013   ALKPHOS 119* 09/08/2013   BILITOT 0.7 09/08/2013   Lab Results  Component Value Date   TSH 0.352 06/09/2013     Imaging Studies: Dg Chest 2 View  09/08/2013   CLINICAL DATA:  Shortness of breath. Chronic diastolic heart failure. Hypertension. Former smoker.  EXAM: CHEST  2 VIEW  COMPARISON:  Chest radiograph 09/07/2013  FINDINGS: There is a left  chest wall dual lead pacemaker with leads in the expected location of the right atrium and right ventricle. Mild cardiomegaly is stable. Mediastinal and hilar contours are within normal limits. Pulmonary vascularity is normal patient is slightly kyphotic in position. The lungs are clear and appear emphysematous. No airspace disease, effusion, or pneumothorax.  Diffuse osteopenia of the bones.  No acute bony abnormality.  IMPRESSION: Suspect at emphysema.  No acute superimposed abnormality.  Stable mild cardiomegaly with dual lead pacemaker present.   Electronically Signed   By: Curlene Dolphin M.D.   On: 09/08/2013 20:22   Ct Angio Chest Pe  W/cm &/or Wo Cm  09/08/2013   CLINICAL DATA:  Shortness of breath for 3 days.  EXAM: CT ANGIOGRAPHY CHEST WITH CONTRAST  TECHNIQUE: Multidetector CT imaging of the chest was performed using the standard protocol during bolus administration of intravenous contrast. Multiplanar CT image reconstructions and MIPs were obtained to evaluate the vascular anatomy.  CONTRAST:  32mL OMNIPAQUE IOHEXOL 350 MG/ML SOLN  COMPARISON:  None.  FINDINGS: Technically adequate study with good opacification of the central and segmental pulmonary arteries. No focal filling defects are demonstrated. No evidence of significant pulmonary embolus.  Normal heart size. Coronary artery and aortic calcifications. Ascending thoracic aorta is dilated with maximal AP diameter of about 4 cm. No dissection. Mural plaque formation demonstrated throughout the aortic arch and descending aorta. Great vessel origins are patent. Esophagus is air-filled but not significantly distended. No significant lymphadenopathy in the chest.  Scattered emphysematous changes in the lungs. No focal consolidation or airspace disease. No pneumothorax. No pleural effusion. Airways appear patent.  Visualized portions of the upper abdominal organs are grossly unremarkable. Degenerative changes in the thoracic spine. No destructive bone  lesions are appreciated.  Review of the MIP images confirms the above findings.  IMPRESSION: No evidence of significant pulmonary embolus. Emphysematous changes in the lungs. Ascending aortic dilatation with AP diameter of 4 cm.   Electronically Signed   By: Lucienne Capers M.D.   On: 09/08/2013 21:24   Dg Chest Portable 1 View  09/07/2013   CLINICAL DATA:  Shortness of breath and fatigue, remote smoker, pacemaker  EXAM: PORTABLE CHEST - 1 VIEW  COMPARISON:  06/09/2013  FINDINGS: Left subclavian 2 lead pacer noted. Cardiomegaly present without associated edema or CHF. No focal pneumonia, collapse or consolidation. Negative for effusion or pneumothorax. Trachea midline. Bones appear osteopenic. Overall stable exam.  IMPRESSION: cardiomegaly without superimposed acute process.   Electronically Signed   By: Daryll Brod M.D.   On: 09/07/2013 09:19

## 2013-10-01 NOTE — Telephone Encounter (Signed)
pts son, Jordin Dambrosio called (left voicemail)- pt told him that [facility] wanted to speak with him. His number is [phone]

## 2013-10-02 ENCOUNTER — Ambulatory Visit (INDEPENDENT_AMBULATORY_CARE_PROVIDER_SITE_OTHER): Payer: Medicare HMO

## 2013-10-02 ENCOUNTER — Encounter (HOSPITAL_COMMUNITY): Payer: Self-pay | Admitting: Pharmacy Technician

## 2013-10-02 ENCOUNTER — Other Ambulatory Visit: Payer: Self-pay | Admitting: Internal Medicine

## 2013-10-02 DIAGNOSIS — R1013 Epigastric pain: Secondary | ICD-10-CM

## 2013-10-02 DIAGNOSIS — K921 Melena: Secondary | ICD-10-CM

## 2013-10-02 DIAGNOSIS — R110 Nausea: Secondary | ICD-10-CM

## 2013-10-02 MED ORDER — PEG 3350-KCL-NA BICARB-NACL 420 G PO SOLR: 4000.0000 mL | ORAL | Status: DC

## 2013-10-02 NOTE — Telephone Encounter (Signed)
I have TCS/EGD scheduled w/RMR on Friday Sept 4th, I spoke to Ms. Bonfield and explained has much as I could to her and She wrote everything down, I am mailing her prep instructions to her and told her to be watching the for them.

## 2013-10-02 NOTE — Telephone Encounter (Signed)
Kari Hall, pts blood work results are in epic for review.

## 2013-10-03 LAB — IFOBT (OCCULT BLOOD): IFOBT: NEGATIVE

## 2013-10-03 NOTE — Progress Notes (Signed)
Pt returned IFOBT test and it was NEGATIVE 

## 2013-10-06 NOTE — Progress Notes (Signed)
cc'd to pcp 

## 2013-10-07 NOTE — Telephone Encounter (Signed)
Per Maury Dus, RN with [facility] Ms. Dall will be seen on 10/02/13 by Ronnell Freshwater. Laurance Flatten

## 2013-10-08 NOTE — Progress Notes (Signed)
Quick Note:  Cbc and ifobt unremarkale. Procedures as planned. ______

## 2013-10-13 ENCOUNTER — Telehealth: Payer: Self-pay | Admitting: Internal Medicine

## 2013-10-13 NOTE — Telephone Encounter (Signed)
ALISA GILBOY NURSE CASE MANAGER FOR DR. Gerarda Fraction NEEDS PRE PROCEDURE INSTRUCTIONS FAXED TO [phone] REGARDING UPCOMING PROCEDURE FOR PATIENT.  HER PHONE NUMBER IS [phone]

## 2013-10-13 NOTE — Telephone Encounter (Signed)
Instructions have been faxed to [facility]

## 2013-10-15 ENCOUNTER — Emergency Department (HOSPITAL_COMMUNITY): Payer: Medicare HMO

## 2013-10-15 ENCOUNTER — Encounter (HOSPITAL_COMMUNITY): Payer: Self-pay | Admitting: Emergency Medicine

## 2013-10-15 ENCOUNTER — Emergency Department (HOSPITAL_COMMUNITY)
Admission: EM | Admit: 2013-10-15 | Discharge: 2013-10-16 | Disposition: A | Discharge status: Home / Self Care | Payer: Medicare HMO | Attending: Emergency Medicine | Admitting: Emergency Medicine

## 2013-10-15 DIAGNOSIS — Z79899 Other long term (current) drug therapy: Secondary | ICD-10-CM | POA: Insufficient documentation

## 2013-10-15 DIAGNOSIS — Z950 Presence of cardiac pacemaker: Secondary | ICD-10-CM | POA: Insufficient documentation

## 2013-10-15 DIAGNOSIS — Z7982 Long term (current) use of aspirin: Secondary | ICD-10-CM | POA: Diagnosis not present

## 2013-10-15 DIAGNOSIS — K219 Gastro-esophageal reflux disease without esophagitis: Secondary | ICD-10-CM | POA: Insufficient documentation

## 2013-10-15 DIAGNOSIS — E785 Hyperlipidemia, unspecified: Secondary | ICD-10-CM | POA: Insufficient documentation

## 2013-10-15 DIAGNOSIS — F29 Unspecified psychosis not due to a substance or known physiological condition: Secondary | ICD-10-CM | POA: Diagnosis not present

## 2013-10-15 DIAGNOSIS — R0789 Other chest pain: Secondary | ICD-10-CM | POA: Insufficient documentation

## 2013-10-15 DIAGNOSIS — Z87891 Personal history of nicotine dependence: Secondary | ICD-10-CM | POA: Insufficient documentation

## 2013-10-15 DIAGNOSIS — F411 Generalized anxiety disorder: Secondary | ICD-10-CM | POA: Insufficient documentation

## 2013-10-15 DIAGNOSIS — E039 Hypothyroidism, unspecified: Secondary | ICD-10-CM | POA: Insufficient documentation

## 2013-10-15 DIAGNOSIS — I10 Essential (primary) hypertension: Secondary | ICD-10-CM | POA: Insufficient documentation

## 2013-10-15 DIAGNOSIS — I5032 Chronic diastolic (congestive) heart failure: Secondary | ICD-10-CM | POA: Diagnosis not present

## 2013-10-15 DIAGNOSIS — F419 Anxiety disorder, unspecified: Secondary | ICD-10-CM

## 2013-10-15 LAB — CBC WITH DIFFERENTIAL/PLATELET
Basophils Absolute: 0 10*3/uL (ref 0.0–0.1)
Basophils Relative: 0 % (ref 0–1)
Eosinophils Absolute: 0.1 10*3/uL (ref 0.0–0.7)
Eosinophils Relative: 2 % (ref 0–5)
HCT: 40.2 % (ref 36.0–46.0)
Hemoglobin: 13.6 g/dL (ref 12.0–15.0)
LYMPHS PCT: 19 % (ref 12–46)
Lymphs Abs: 1.1 10*3/uL (ref 0.7–4.0)
MCH: 31.7 pg (ref 26.0–34.0)
MCHC: 33.8 g/dL (ref 30.0–36.0)
MCV: 93.7 fL (ref 78.0–100.0)
Monocytes Absolute: 0.6 10*3/uL (ref 0.1–1.0)
Monocytes Relative: 12 % (ref 3–12)
NEUTROS PCT: 67 % (ref 43–77)
Neutro Abs: 3.6 10*3/uL (ref 1.7–7.7)
PLATELETS: 141 10*3/uL — AB (ref 150–400)
RBC: 4.29 MIL/uL (ref 3.87–5.11)
RDW: 13.3 % (ref 11.5–15.5)
WBC: 5.5 10*3/uL (ref 4.0–10.5)

## 2013-10-15 LAB — BASIC METABOLIC PANEL
Anion gap: 13 (ref 5–15)
BUN: 12 mg/dL (ref 6–23)
CO2: 23 meq/L (ref 19–32)
Calcium: 9 mg/dL (ref 8.4–10.5)
Chloride: 106 mEq/L (ref 96–112)
Creatinine, Ser: 1.06 mg/dL (ref 0.50–1.10)
GFR calc Af Amer: 57 mL/min — ABNORMAL LOW (ref >90)
GFR calc non Af Amer: 49 mL/min — ABNORMAL LOW (ref >90)
GLUCOSE: 119 mg/dL — AB (ref 70–99)
POTASSIUM: 5.1 meq/L (ref 3.7–5.3)
SODIUM: 142 meq/L (ref 137–147)

## 2013-10-15 LAB — RAPID URINE DRUG SCREEN, HOSP PERFORMED
AMPHETAMINES: NOT DETECTED
BENZODIAZEPINES: NOT DETECTED
Barbiturates: NOT DETECTED
Cocaine: NOT DETECTED
Opiates: POSITIVE — AB
Tetrahydrocannabinol: NOT DETECTED

## 2013-10-15 LAB — TROPONIN I

## 2013-10-15 MED ORDER — ONDANSETRON 4 MG PO TBDP: ORAL_TABLET | ORAL | Status: DC

## 2013-10-15 NOTE — ED Notes (Signed)
Pt called EMS for gen weakness,no appetite. Per EMS, pt started complaining chest discomfort en route then denied pain shortly after. cbg 140 en route. Pt alert/oriented and nad at this time. Non diaphoretic

## 2013-10-15 NOTE — ED Notes (Signed)
Pt states was very anxious this am thinking about her colonoscopy on Friday. States kept asking for her prescription antianxiety meds but daughter inlaw would not give it to her. Pt called Ems due to feeling anxious per pt. Pt stated chest "just felt funny" for a few minutes en route but has denied pain since. No anxiety noted at this time.Son called and stated she has been fighting with them about the prep test for the colonoscopy and noncompliant. Kari Hall, son [phone]. Pt denies pain. C/o generalized weakness x 2 since dr told her to not eat to prep for the colonoscopy. Pt states "I feel better now but still al little shaky inside". NAD

## 2013-10-15 NOTE — ED Notes (Signed)
Pt states she feels better and has no anxiety at this time and has not had any cp since on EMS truck. Pt wanted me to call son and advise him to come get her. EDP aware

## 2013-10-15 NOTE — ED Notes (Signed)
Son Elta Guadeloupe called to pick pt up and stated he would get here as soon as he can. Patient aware. Nad.

## 2013-10-15 NOTE — ED Provider Notes (Signed)
CSN: [id number]     Arrival date & time 10/15/13  0388 History   First MD Initiated Contact with Patient 10/15/13 0912    This chart was scribed for Maudry Diego, MD by Edison Simon, ED Scribe. This patient was seen in room APA02/APA02 and the patient's care was started at 9:25 AM.   Chief Complaint  Patient presents with  . Anxiety   Patient is a 79 y.o. female presenting with anxiety. The history is provided by the patient (the pt complains of stress and anxiety today). No language interpreter was used.  Anxiety This is a chronic problem. The current episode started 1 to 2 hours ago. The problem occurs every several days. The problem has been gradually improving. Associated symptoms include chest pain. Pertinent negatives include no abdominal pain and no headaches. Nothing aggravates the symptoms. Nothing relieves the symptoms. She has tried nothing for the symptoms.    HPI Comments: Kari Hall is a 79 y.o. female who presents to the Emergency Department complaining of anxiety with onset this morning. She also reported associated chest pain this morning, but states that has remissede. She reports associated difficulty thinking straight, weakness, and states she has not eaten in 3-4 days in preparation for surgery. Her son indicates that she has a colonoscopy scheduled in 2 days, but she states it is for a different surgery scheduled for tomorrow by Dr. Rosine Door, though she is unable to specify what the surgery is. She denies any pain at this time.  Past Medical History  Diagnosis Date  . GERD (gastroesophageal reflux disease)   . Dyslipidemia   . Essential hypertension, benign   . Hypothyroidism   . IBS (irritable bowel syndrome)   . Diverticulitis   . Sinoatrial node dysfunction   . Chronic diastolic heart failure   . Pacemaker    Past Surgical History  Procedure Laterality Date  . Cholecystectomy    . Appendectomy    . Colectomy      secondary to diverticulitis  . Ectopic  pregnancy surgery    . Pacemaker placement    . Abdominal hysterectomy    . Colonoscopy  2003    Dr. Arnoldo Morale: few scattered diverticula in transverse and descending colon  . ?thyroid surgery?     Family History  Problem Relation Age of Onset  . Stroke Mother   . Emphysema Father   . Colon cancer Neg Hx    History  Substance Use Topics  . Smoking status: Former Smoker -- 0.50 packs/day for 45 years    Types: Cigarettes    Quit date: 02/24/1979  . Smokeless tobacco: Never Used  . Alcohol Use: No   OB History   Grav Para Term Preterm Abortions TAB SAB Ect Mult Living   3 3 3       3      Review of Systems  Constitutional: Negative for appetite change and fatigue.  HENT: Negative for congestion, ear discharge and sinus pressure.   Eyes: Negative for discharge.  Respiratory: Negative for cough.   Cardiovascular: Positive for chest pain.  Gastrointestinal: Negative for abdominal pain and diarrhea.  Genitourinary: Negative for frequency and hematuria.  Musculoskeletal: Negative for back pain.  Skin: Negative for rash.  Neurological: Positive for weakness. Negative for seizures and headaches.  Psychiatric/Behavioral: Negative for hallucinations.      Allergies  Review of patient's allergies indicates no known allergies.  Home Medications   Prior to Admission medications   Medication Sig Start Date End  Date Taking? Authorizing Provider  aspirin EC 81 MG tablet Take 81 mg by mouth daily.    Historical Provider, MD  atenolol (TENORMIN) 50 MG tablet Take 50 mg by mouth daily.      Historical Provider, MD  furosemide (LASIX) 20 MG tablet Take 20 mg by mouth daily with lunch.  03/14/13   Lendon Colonel, NP  levothyroxine (SYNTHROID, LEVOTHROID) 100 MCG tablet Take 100 mcg by mouth daily before breakfast.    Historical Provider, MD  omeprazole (PRILOSEC) 20 MG capsule Take 20 mg by mouth daily.    Historical Provider, MD  potassium chloride SA (K-DUR,KLOR-CON) 20 MEQ tablet  Take 60 mEq by mouth daily.     Historical Provider, MD  PROAIR HFA 108 (90 BASE) MCG/ACT inhaler  09/18/13   Historical Provider, MD  QUEtiapine (SEROQUEL) 100 MG tablet Take 100 mg by mouth 2 (two) times daily.  09/12/13   Historical Provider, MD   There were no vitals taken for this visit. Physical Exam  Constitutional: She is oriented to person, place, and time. She appears well-developed.  HENT:  Head: Normocephalic.  Eyes: Conjunctivae and EOM are normal. No scleral icterus.  pinpioint pupils  Neck: Neck supple. No thyromegaly present.  Cardiovascular: Normal rate and regular rhythm.  Exam reveals no gallop and no friction rub.   No murmur heard. Pulmonary/Chest: No stridor. She has no wheezes. She has no rales. She exhibits no tenderness.  Abdominal: She exhibits no distension. There is no tenderness. There is no rebound.  Musculoskeletal: Normal range of motion. She exhibits no edema.  Lymphadenopathy:    She has no cervical adenopathy.  Neurological: She is oriented to person, place, and time. She exhibits normal muscle tone. Coordination normal.  Mildly confused  Skin: No rash noted. No erythema.  Psychiatric: She has a normal mood and affect. Her behavior is normal.    ED Course  Procedures (including critical care time) Labs Review Labs Reviewed  CBC WITH DIFFERENTIAL  BASIC METABOLIC PANEL    Imaging Review No results found.   EKG Interpretation   Date/Time:  Wednesday October 15 2013 08:56:59 EDT Ventricular Rate:  62 PR Interval:  213 QRS Duration: 73 QT Interval:  410 QTC Calculation: 416 R Axis:   -3 Text Interpretation:  Sinus or ectopic atrial rhythm Borderline prolonged  PR interval Confirmed by Airik Goodlin  MD, Kester Stimpson ([id number]) on 10/15/2013 11:39:50  AM      COORDINATION OF CARE:    MDM   Final diagnoses:  None    Neg.  Studies.  Pt to follow up with pcp   The chart was scribed for me under my direct supervision.  I personally performed the  history, physical, and medical decision making and all procedures in the evaluation of this patient.Maudry Diego, MD 10/15/13 [phone]

## 2013-10-15 NOTE — Discharge Instructions (Signed)
Follow up with your md as planned °

## 2013-10-16 ENCOUNTER — Encounter (HOSPITAL_COMMUNITY): Payer: Self-pay | Admitting: Emergency Medicine

## 2013-10-16 ENCOUNTER — Emergency Department (HOSPITAL_COMMUNITY)
Admission: EM | Admit: 2013-10-16 | Discharge: 2013-10-16 | Disposition: A | Discharge status: Home / Self Care | Payer: Medicare HMO | Attending: Emergency Medicine | Admitting: Emergency Medicine

## 2013-10-16 ENCOUNTER — Emergency Department (HOSPITAL_COMMUNITY): Payer: Medicare HMO

## 2013-10-16 DIAGNOSIS — E785 Hyperlipidemia, unspecified: Secondary | ICD-10-CM | POA: Diagnosis not present

## 2013-10-16 DIAGNOSIS — F411 Generalized anxiety disorder: Secondary | ICD-10-CM | POA: Diagnosis present

## 2013-10-16 DIAGNOSIS — F419 Anxiety disorder, unspecified: Secondary | ICD-10-CM

## 2013-10-16 DIAGNOSIS — Z87891 Personal history of nicotine dependence: Secondary | ICD-10-CM | POA: Diagnosis not present

## 2013-10-16 DIAGNOSIS — K219 Gastro-esophageal reflux disease without esophagitis: Secondary | ICD-10-CM | POA: Diagnosis not present

## 2013-10-16 DIAGNOSIS — R0602 Shortness of breath: Secondary | ICD-10-CM | POA: Insufficient documentation

## 2013-10-16 DIAGNOSIS — IMO0002 Reserved for concepts with insufficient information to code with codable children: Secondary | ICD-10-CM | POA: Insufficient documentation

## 2013-10-16 DIAGNOSIS — I10 Essential (primary) hypertension: Secondary | ICD-10-CM | POA: Insufficient documentation

## 2013-10-16 DIAGNOSIS — E039 Hypothyroidism, unspecified: Secondary | ICD-10-CM | POA: Insufficient documentation

## 2013-10-16 DIAGNOSIS — F0390 Unspecified dementia without behavioral disturbance: Secondary | ICD-10-CM | POA: Insufficient documentation

## 2013-10-16 DIAGNOSIS — Z79899 Other long term (current) drug therapy: Secondary | ICD-10-CM | POA: Diagnosis not present

## 2013-10-16 DIAGNOSIS — Z950 Presence of cardiac pacemaker: Secondary | ICD-10-CM | POA: Diagnosis not present

## 2013-10-16 DIAGNOSIS — I5032 Chronic diastolic (congestive) heart failure: Secondary | ICD-10-CM | POA: Diagnosis not present

## 2013-10-16 DIAGNOSIS — Z7982 Long term (current) use of aspirin: Secondary | ICD-10-CM | POA: Insufficient documentation

## 2013-10-16 NOTE — ED Notes (Addendum)
Per EMS, call came out for difficulty breathing, RR of 30, no distress. Breathing slowed down with verbal coaching. Pt states she is anxious about colonoscopy scheduled for tomorrow. Pt states she vomited x 1 this morning. Pt was seen here yesterday for same and prescribed Zofran which she had filled but was unable to find this morning. Daughter states she previously abused lorazepam for anxiety (took 79 too many) and for that reason doctor took her off of Lorazepam and prescribed Viibryd , which she takes at night.

## 2013-10-16 NOTE — ED Notes (Signed)
Pt ambulated to restroom with assistance.  

## 2013-10-16 NOTE — Discharge Instructions (Signed)
Follow up with your md °

## 2013-10-16 NOTE — ED Provider Notes (Signed)
CSN: [id number]     Arrival date & time 10/16/13  1012 History   First MD Initiated Contact with Patient 10/16/13 1222     Chief Complaint  Patient presents with  . Anxiety     (Consider location/radiation/quality/duration/timing/severity/associated sxs/prior Treatment) Patient is a 79 y.o. female presenting with anxiety. The history is provided by the EMS personnel (pt called ems for sob.  pt has no sob now).  Anxiety This is a chronic problem. The current episode started more than 2 days ago. The problem occurs constantly. The problem has not changed since onset.Associated symptoms include shortness of breath. Pertinent negatives include no chest pain, no abdominal pain and no headaches. Nothing aggravates the symptoms. Nothing relieves the symptoms.    Past Medical History  Diagnosis Date  . GERD (gastroesophageal reflux disease)   . Dyslipidemia   . Essential hypertension, benign   . Hypothyroidism   . IBS (irritable bowel syndrome)   . Diverticulitis   . Sinoatrial node dysfunction   . Chronic diastolic heart failure   . Pacemaker    Past Surgical History  Procedure Laterality Date  . Cholecystectomy    . Appendectomy    . Colectomy      secondary to diverticulitis  . Ectopic pregnancy surgery    . Pacemaker placement    . Abdominal hysterectomy    . Colonoscopy  2003    Dr. Arnoldo Morale: few scattered diverticula in transverse and descending colon  . ?thyroid surgery?     Family History  Problem Relation Age of Onset  . Stroke Mother   . Emphysema Father   . Colon cancer Neg Hx    History  Substance Use Topics  . Smoking status: Former Smoker -- 0.50 packs/day for 45 years    Types: Cigarettes    Quit date: 02/24/1979  . Smokeless tobacco: Never Used  . Alcohol Use: No   OB History   Grav Para Term Preterm Abortions TAB SAB Ect Mult Living   3 3 3       3      Review of Systems  Constitutional: Negative for appetite change and fatigue.  HENT: Negative for  congestion, ear discharge and sinus pressure.   Eyes: Negative for discharge.  Respiratory: Positive for shortness of breath. Negative for cough.   Cardiovascular: Negative for chest pain.  Gastrointestinal: Negative for abdominal pain and diarrhea.  Genitourinary: Negative for frequency and hematuria.  Musculoskeletal: Negative for back pain.  Skin: Negative for rash.  Neurological: Negative for seizures and headaches.  Psychiatric/Behavioral: Positive for agitation. Negative for hallucinations.      Allergies  Review of patient's allergies indicates no known allergies.  Home Medications   Prior to Admission medications   Medication Sig Start Date End Date Taking? Authorizing Provider  QUEtiapine (SEROQUEL) 100 MG tablet Take 100 mg by mouth 2 (two) times daily.   Yes Historical Provider, MD  Vilazodone HCl (VIIBRYD) 20 MG TABS Take 20 mg by mouth daily.   Yes Historical Provider, MD  aspirin EC 81 MG tablet Take 81 mg by mouth daily.    Historical Provider, MD  atenolol (TENORMIN) 50 MG tablet Take 50 mg by mouth daily.      Historical Provider, MD  clonazePAM (KLONOPIN) 0.5 MG tablet Take 0.5 mg by mouth 2 (two) times daily as needed for anxiety.    Historical Provider, MD  furosemide (LASIX) 20 MG tablet Take 20 mg by mouth daily.  03/14/13   Lendon Colonel, NP  levothyroxine (SYNTHROID, LEVOTHROID) 100 MCG tablet Take 100 mcg by mouth daily before breakfast.    Historical Provider, MD  omeprazole (PRILOSEC) 20 MG capsule Take 20 mg by mouth daily.    Historical Provider, MD  ondansetron (ZOFRAN ODT) 4 MG disintegrating tablet 4mg  ODT q4 hours prn nausea/vomit 10/15/13   Maudry Diego, MD  potassium chloride SA (K-DUR,KLOR-CON) 20 MEQ tablet Take 40 mEq by mouth daily.     Historical Provider, MD   BP 129/61  Pulse 64  Temp(Src) 98 F (36.7 C) (Oral)  Resp 18  Ht 5\' 6"  (1.676 m)  Wt 194 lb (87.998 kg)  BMI 31.33 kg/m2  SpO2 95% Physical Exam  Constitutional: She  appears well-developed.  HENT:  Head: Normocephalic.  Eyes: Conjunctivae and EOM are normal. No scleral icterus.  Neck: Neck supple. No thyromegaly present.  Cardiovascular: Normal rate and regular rhythm.  Exam reveals no gallop and no friction rub.   No murmur heard. Pulmonary/Chest: No stridor. She has no wheezes. She has no rales. She exhibits no tenderness.  Abdominal: She exhibits no distension. There is no tenderness. There is no rebound.  Musculoskeletal: Normal range of motion. She exhibits no edema.  Lymphadenopathy:    She has no cervical adenopathy.  Neurological: She exhibits normal muscle tone. Coordination normal.  Pt has dementia and is oriented to person only  Skin: No rash noted. No erythema.  Psychiatric:  Mildly anxious    ED Course  Procedures (including critical care time) Labs Review Labs Reviewed - No data to display  Imaging Review Dg Chest Portable 1 View  10/16/2013   CLINICAL DATA:  Anxiety and pain  EXAM: PORTABLE CHEST - 1 VIEW  COMPARISON:  PA and lateral chest x-Koppelman of September 08, 2013 and portable chest x-Noack of October 15, 2013.  FINDINGS: The lungs are mildly hyperinflated. The interstitial markings are coarse but stable since September 2nd. These are new however since July 27th. The cardiopericardial silhouette is enlarged. The pulmonary vascularity is prominent centrally but stable. The permanent pacemaker is unchanged in appearance. The observed portions of the bony thorax are normal.  IMPRESSION: COPD and likely low-grade CHF. There is no evidence of pneumonia. A followup PA and lateral chest x-Hoare would be useful.   Electronically Signed   By: David  Martinique   On: 10/16/2013 12:59   Dg Chest Portable 1 View  10/15/2013   CLINICAL DATA:  Chest pain.  Anxiety.  EXAM: PORTABLE CHEST - 1 VIEW  COMPARISON:  CT chest and PA and lateral chest 09/08/2013.  FINDINGS: The lungs are clear. Heart size is mildly enlarged. No pneumothorax or pleural effusion. No  focal bony abnormality.  IMPRESSION: Mild cardiomegaly without acute disease.   Electronically Signed   By: Inge Rise M.D.   On: 10/15/2013 09:55     EKG Interpretation None      MDM   Final diagnoses:  Anxiety   Anxiety,  Pt to follow up with pcp    Maudry Diego, MD 10/16/13 1515

## 2013-10-16 NOTE — ED Notes (Signed)
EDP at bedside  

## 2013-10-17 ENCOUNTER — Encounter (HOSPITAL_COMMUNITY)
Admission: RE | Disposition: A | Discharge status: Home / Self Care | Payer: Self-pay | Source: Ambulatory Visit | Attending: Internal Medicine

## 2013-10-17 ENCOUNTER — Ambulatory Visit (HOSPITAL_COMMUNITY)
Admission: RE | Admit: 2013-10-17 | Discharge: 2013-10-17 | Disposition: A | Discharge status: Home / Self Care | Payer: Medicare HMO | Source: Ambulatory Visit | Attending: Internal Medicine | Admitting: Internal Medicine

## 2013-10-17 DIAGNOSIS — K449 Diaphragmatic hernia without obstruction or gangrene: Secondary | ICD-10-CM | POA: Insufficient documentation

## 2013-10-17 DIAGNOSIS — I5032 Chronic diastolic (congestive) heart failure: Secondary | ICD-10-CM | POA: Insufficient documentation

## 2013-10-17 DIAGNOSIS — K589 Irritable bowel syndrome without diarrhea: Secondary | ICD-10-CM | POA: Diagnosis not present

## 2013-10-17 DIAGNOSIS — E785 Hyperlipidemia, unspecified: Secondary | ICD-10-CM | POA: Diagnosis not present

## 2013-10-17 DIAGNOSIS — Z950 Presence of cardiac pacemaker: Secondary | ICD-10-CM | POA: Insufficient documentation

## 2013-10-17 DIAGNOSIS — Z86010 Personal history of colonic polyps: Secondary | ICD-10-CM

## 2013-10-17 DIAGNOSIS — I509 Heart failure, unspecified: Secondary | ICD-10-CM | POA: Insufficient documentation

## 2013-10-17 DIAGNOSIS — Z7982 Long term (current) use of aspirin: Secondary | ICD-10-CM | POA: Diagnosis not present

## 2013-10-17 DIAGNOSIS — D126 Benign neoplasm of colon, unspecified: Secondary | ICD-10-CM | POA: Diagnosis not present

## 2013-10-17 DIAGNOSIS — K219 Gastro-esophageal reflux disease without esophagitis: Secondary | ICD-10-CM | POA: Diagnosis not present

## 2013-10-17 DIAGNOSIS — K5730 Diverticulosis of large intestine without perforation or abscess without bleeding: Secondary | ICD-10-CM | POA: Insufficient documentation

## 2013-10-17 DIAGNOSIS — K5732 Diverticulitis of large intestine without perforation or abscess without bleeding: Secondary | ICD-10-CM | POA: Diagnosis not present

## 2013-10-17 DIAGNOSIS — K222 Esophageal obstruction: Secondary | ICD-10-CM | POA: Insufficient documentation

## 2013-10-17 DIAGNOSIS — R110 Nausea: Secondary | ICD-10-CM

## 2013-10-17 DIAGNOSIS — I10 Essential (primary) hypertension: Secondary | ICD-10-CM | POA: Insufficient documentation

## 2013-10-17 DIAGNOSIS — E039 Hypothyroidism, unspecified: Secondary | ICD-10-CM | POA: Insufficient documentation

## 2013-10-17 DIAGNOSIS — Z79899 Other long term (current) drug therapy: Secondary | ICD-10-CM | POA: Insufficient documentation

## 2013-10-17 DIAGNOSIS — R198 Other specified symptoms and signs involving the digestive system and abdomen: Secondary | ICD-10-CM | POA: Diagnosis present

## 2013-10-17 DIAGNOSIS — Z87891 Personal history of nicotine dependence: Secondary | ICD-10-CM | POA: Diagnosis not present

## 2013-10-17 DIAGNOSIS — K921 Melena: Secondary | ICD-10-CM

## 2013-10-17 DIAGNOSIS — R1013 Epigastric pain: Secondary | ICD-10-CM

## 2013-10-17 HISTORY — PX: COLONOSCOPY: SHX5424

## 2013-10-17 HISTORY — PX: ESOPHAGOGASTRODUODENOSCOPY: SHX5428

## 2013-10-17 SURGERY — COLONOSCOPY
Anesthesia: Moderate Sedation

## 2013-10-17 MED ORDER — MIDAZOLAM HCL 5 MG/5ML IJ SOLN
INTRAMUSCULAR | Status: DC | PRN
  Administered 2013-10-17: 0.5 mg via INTRAVENOUS
  Administered 2013-10-17: 2 mg via INTRAVENOUS

## 2013-10-17 MED ORDER — MEPERIDINE HCL 100 MG/ML IJ SOLN
INTRAMUSCULAR | Status: DC | PRN
  Administered 2013-10-17: 50 mg via INTRAVENOUS

## 2013-10-17 MED ORDER — LIDOCAINE VISCOUS 2 % MT SOLN
OROMUCOSAL | Status: DC | PRN
  Administered 2013-10-17: 3 mL via OROMUCOSAL

## 2013-10-17 MED ORDER — ONDANSETRON HCL 4 MG/2ML IJ SOLN
INTRAMUSCULAR | Status: DC | PRN
  Administered 2013-10-17: 4 mg via INTRAVENOUS

## 2013-10-17 MED ORDER — SODIUM CHLORIDE 0.9 % IV SOLN
INTRAVENOUS | Status: DC
  Administered 2013-10-17: 1000 mL via INTRAVENOUS

## 2013-10-17 MED ORDER — MEPERIDINE HCL 100 MG/ML IJ SOLN
INTRAMUSCULAR | Status: AC
  Filled 2013-10-17: qty 2

## 2013-10-17 MED ORDER — LIDOCAINE VISCOUS 2 % MT SOLN
OROMUCOSAL | Status: AC
  Filled 2013-10-17: qty 15

## 2013-10-17 MED ORDER — ONDANSETRON HCL 4 MG/2ML IJ SOLN
INTRAMUSCULAR | Status: DC
Start: 2013-10-17 — End: 2013-10-17
  Filled 2013-10-17: qty 2

## 2013-10-17 MED ORDER — MIDAZOLAM HCL 5 MG/5ML IJ SOLN
INTRAMUSCULAR | Status: AC
  Filled 2013-10-17: qty 10

## 2013-10-17 MED ORDER — STERILE WATER FOR IRRIGATION IR SOLN
Status: DC | PRN
  Administered 2013-10-17: 10:00:00

## 2013-10-17 NOTE — Interval H&P Note (Signed)
History and Physical Interval Note:  10/17/2013 9:28 AM  Ellwood Handler  has presented today for surgery, with the diagnosis of MELENA, ABDOMINAL PAIN, NAUSEA  The various methods of treatment have been discussed with the patient and family. After consideration of risks, benefits and other options for treatment, the patient has consented to  Procedure(s) with comments: COLONOSCOPY (N/A) - 9:30 ESOPHAGOGASTRODUODENOSCOPY (EGD) (N/A) as a surgical intervention .  The patient's history has been reviewed, patient examined, no change in status, stable for surgery.  I have reviewed the patient's chart and labs.  Questions were answered to the patient's satisfaction.     Maeby Vankleeck  No change. Hemoglobin normal. Stool negative for occult blood. EGD and colonoscopy per plan.The risks, benefits, limitations, imponderables and alternatives regarding both EGD and colonoscopy have been reviewed with the patient. Questions have been answered. All parties agreeable.

## 2013-10-17 NOTE — Progress Notes (Signed)
Dr Gala Romney made aware Kari Hall ate chicken broth this AM. Instructed to keep Kari Hall NPO and will proceed with procedure.

## 2013-10-17 NOTE — Discharge Instructions (Signed)
Colonoscopy Discharge Instructions  Read the instructions outlined below and refer to this sheet in the next few weeks. These discharge instructions provide you with general information on caring for yourself after you leave the hospital. Your doctor may also give you specific instructions. While your treatment has been planned according to the most current medical practices available, unavoidable complications occasionally occur. If you have any problems or questions after discharge, call Dr. Gala Romney at [phone]. ACTIVITY  You may resume your regular activity, but move at a slower pace for the next 24 hours.   Take frequent rest periods for the next 24 hours.   Walking will help get rid of the air and reduce the bloated feeling in your belly (abdomen).   No driving for 24 hours (because of the medicine (anesthesia) used during the test).    Do not sign any important legal documents or operate any machinery for 24 hours (because of the anesthesia used during the test).  NUTRITION  Drink plenty of fluids.   You may resume your normal diet as instructed by your doctor.   Begin with a light meal and progress to your normal diet. Heavy or fried foods are harder to digest and may make you feel sick to your stomach (nauseated).   Avoid alcoholic beverages for 24 hours or as instructed.  MEDICATIONS  You may resume your normal medications unless your doctor tells you otherwise.  WHAT YOU CAN EXPECT TODAY  Some feelings of bloating in the abdomen.   Passage of more gas than usual.   Spotting of blood in your stool or on the toilet paper.  IF YOU HAD POLYPS REMOVED DURING THE COLONOSCOPY:  No aspirin products for 7 days or as instructed.   No alcohol for 7 days or as instructed.   Eat a soft diet for the next 24 hours.  FINDING OUT THE RESULTS OF YOUR TEST Not all test results are available during your visit. If your test results are not back during the visit, make an appointment  with your caregiver to find out the results. Do not assume everything is normal if you have not heard from your caregiver or the medical facility. It is important for you to follow up on all of your test results.  SEEK IMMEDIATE MEDICAL ATTENTION IF:  You have more than a spotting of blood in your stool.   Your belly is swollen (abdominal distention).   You are nauseated or vomiting.   You have a temperature over 101.  You have abdominal pain or discomfort that is severe or gets worse throughout the day. EGD Discharge instructions Please read the instructions outlined below and refer to this sheet in the next few weeks. These discharge instructions provide you with general information on caring for yourself after you leave the hospital. Your doctor may also give you specific instructions. While your treatment has been planned according to the most current medical practices available, unavoidable complications occasionally occur. If you have any problems or questions after discharge, please call your doctor. ACTIVITY You may resume your regular activity but move at a slower pace for the next 24 hours.  Take frequent rest periods for the next 24 hours.  Walking will help expel (get rid of) the air and reduce the bloated feeling in your abdomen.  No driving for 24 hours (because of the anesthesia (medicine) used during the test).  You may shower.  Do not sign any important legal documents or operate any machinery for 24  hours (because of the anesthesia used during the test).  NUTRITION Drink plenty of fluids.  You may resume your normal diet.  Begin with a light meal and progress to your normal diet.  Avoid alcoholic beverages for 24 hours or as instructed by your caregiver.  MEDICATIONS You may resume your normal medications unless your caregiver tells you otherwise.  WHAT YOU CAN EXPECT TODAY You may experience abdominal discomfort such as a feeling of fullness or gas pains.   FOLLOW-UP Your doctor will discuss the results of your test with you.  SEEK IMMEDIATE MEDICAL ATTENTION IF ANY OF THE FOLLOWING OCCUR: Excessive nausea (feeling sick to your stomach) and/or vomiting.  Severe abdominal pain and distention (swelling).  Trouble swallowing.  Temperature over 101 F (37.8 C).  Rectal bleeding or vomiting of blood.    Continue Prilosec 20 mg daily  Diverticulosis and polyp information provided  Begin Benefiber 2 teaspoons twice daily  Further recommendations to follow pending review of pathology report Colon Polyps Polyps are lumps of extra tissue growing inside the body. Polyps can grow in the large intestine (colon). Most colon polyps are noncancerous (benign). However, some colon polyps can become cancerous over time. Polyps that are larger than a pea may be harmful. To be safe, caregivers remove and test all polyps. CAUSES  Polyps form when mutations in the genes cause your cells to grow and divide even though no more tissue is needed. RISK FACTORS There are a number of risk factors that can increase your chances of getting colon polyps. They include:  Being older than 50 years.  Family history of colon polyps or colon cancer.  Long-term colon diseases, such as colitis or Crohn disease.  Being overweight.  Smoking.  Being inactive.  Drinking too much alcohol. SYMPTOMS  Most small polyps do not cause symptoms. If symptoms are present, they may include:  Blood in the stool. The stool may look dark red or black.  Constipation or diarrhea that lasts longer than 1 week. DIAGNOSIS People often do not know they have polyps until their caregiver finds them during a regular checkup. Your caregiver can use 4 tests to check for polyps:  Digital rectal exam. The caregiver wears gloves and feels inside the rectum. This test would find polyps only in the rectum.  Barium enema. The caregiver puts a liquid called barium into your rectum before taking  X-rays of your colon. Barium makes your colon look white. Polyps are dark, so they are easy to see in the X-[place] pictures.  Sigmoidoscopy. A thin, flexible tube (sigmoidoscope) is placed into your rectum. The sigmoidoscope has a light and tiny camera in it. The caregiver uses the sigmoidoscope to look at the last third of your colon.  Colonoscopy. This test is like sigmoidoscopy, but the caregiver looks at the entire colon. This is the most common method for finding and removing polyps. TREATMENT  Any polyps will be removed during a sigmoidoscopy or colonoscopy. The polyps are then tested for cancer. PREVENTION  To help lower your risk of getting more colon polyps:  Eat plenty of fruits and vegetables. Avoid eating fatty foods.  Do not smoke.  Avoid drinking alcohol.  Exercise every day.  Lose weight if recommended by your caregiver.  Eat plenty of calcium and folate. Foods that are rich in calcium include milk, cheese, and broccoli. Foods that are rich in folate include chickpeas, kidney beans, and spinach. HOME CARE INSTRUCTIONS Keep all follow-up appointments as directed by your caregiver. You  may need periodic exams to check for polyps. SEEK MEDICAL CARE IF: You notice bleeding during a bowel movement.  Diverticulosis Diverticulosis is the condition that develops when small pouches (diverticula) form in the wall of your colon. Your colon, or large intestine, is where water is absorbed and stool is formed. The pouches form when the inside layer of your colon pushes through weak spots in the outer layers of your colon. CAUSES  No one knows exactly what causes diverticulosis. RISK FACTORS Being older than 59. Your risk for this condition increases with age. Diverticulosis is rare in people younger than 40 years. By age 40, almost everyone has it. Eating a low-fiber diet. Being frequently constipated. Being overweight. Not getting enough exercise. Smoking. Taking over-the-counter  pain medicines, like aspirin and ibuprofen. SYMPTOMS  Most people with diverticulosis do not have symptoms. DIAGNOSIS  Because diverticulosis often has no symptoms, health care providers often discover the condition during an exam for other colon problems. In many cases, a health care provider will diagnose diverticulosis while using a flexible scope to examine the colon (colonoscopy). TREATMENT  If you have never developed an infection related to diverticulosis, you may not need treatment. If you have had an infection before, treatment may include: Eating more fruits, vegetables, and grains. Taking a fiber supplement. Taking a live bacteria supplement (probiotic). Taking medicine to relax your colon. HOME CARE INSTRUCTIONS  Drink at least 6-8 glasses of water each day to prevent constipation. Try not to strain when you have a bowel movement. Keep all follow-up appointments. If you have had an infection before: Increase the fiber in your diet as directed by your health care provider or dietitian. Take a dietary fiber supplement if your health care provider approves. Only take medicines as directed by your health care provider. SEEK MEDICAL CARE IF:  You have abdominal pain. You have bloating. You have cramps. You have not gone to the bathroom in 3 days. SEEK IMMEDIATE MEDICAL CARE IF:  Your pain gets worse. Yourbloating becomes very bad. You have a fever or chills, and your symptoms suddenly get worse. You begin vomiting. You have bowel movements that are bloody or black. MAKE SURE YOU: Understand these instructions. Will watch your condition. Will get help right away if you are not doing well or get worse.

## 2013-10-17 NOTE — H&P (View-Only) (Signed)
Primary Care Physician:  Glo Herring., MD  Primary Gastroenterologist:  Garfield Cornea, MD   Chief Complaint  Patient presents with  . Abdominal Pain  . Nausea    HPI:  Kari Hall is a 79 y.o. female here at the request of Dr. Bronson Ing for further evaluation of abdominal pain and nausea. Patient presented by herself today. She gives primary complaint of SOB for several months and wanting answers. Inhaler helps some. I see where she has been referred to Dr. Luan Pulling by cardiology for SOB but patient reports she has not been seen yet. She denies heartburn. Appetite okay. Some nausea but no vomiting. No weight loss. She reports stools are black within past couple of week. Fecal incontinence at times. Stools are hard to lose. Small stool at least daily. Denies abdominal pain now. She doesn't remember if she had abdominal pain before. Doesn't seem to have good understanding of her medical conditions and somewhat forgetful. Able to report all of her meds however and what they were for after questioning. Surgery list obtained from medical record and consistent with scars.   Current Outpatient Prescriptions  Medication Sig Dispense Refill  . aspirin EC 81 MG tablet Take 81 mg by mouth daily.      Marland Kitchen LORazepam (ATIVAN) 0.5 MG tablet       . PROAIR HFA 108 (90 BASE) MCG/ACT inhaler       . atenolol (TENORMIN) 50 MG tablet Take 50 mg by mouth daily.        . furosemide (LASIX) 20 MG tablet Take 40 mg by mouth daily with lunch.       . levothyroxine (SYNTHROID, LEVOTHROID) 100 MCG tablet Take 100 mcg by mouth daily before breakfast.      . omeprazole (PRILOSEC) 20 MG capsule Take 20 mg by mouth daily.      . potassium chloride SA (K-DUR,KLOR-CON) 20 MEQ tablet Take 40 mEq by mouth daily.      . [DISCONTINUED] ezetimibe-simvastatin (VYTORIN) 10-40 MG per tablet Take 1 tablet by mouth at bedtime.         No current facility-administered medications for this visit.    Allergies as of 10/01/2013   . (No Known Allergies)    Past Medical History  Diagnosis Date  . GERD (gastroesophageal reflux disease)   . Dyslipidemia   . Essential hypertension, benign   . Hypothyroidism   . IBS (irritable bowel syndrome)   . Diverticulitis   . Sinoatrial node dysfunction   . Chronic diastolic heart failure   . Pacemaker     Past Surgical History  Procedure Laterality Date  . Cholecystectomy    . Appendectomy    . Colectomy      secondary to diverticulitis  . Ectopic pregnancy surgery    . Pacemaker placement    . Abdominal hysterectomy    . Colonoscopy  2003    Dr. Arnoldo Morale: few scattered diverticula in transverse and descending colon  . ?thyroid surgery?      Family History  Problem Relation Age of Onset  . Stroke Mother   . Emphysema Father   . Colon cancer Neg Hx     History   Social History  . Marital Status: Divorced    Spouse Name: N/A    Number of Children: 3  . Years of Education: N/A   Occupational History  .     Social History Main Topics  . Smoking status: Former Smoker -- 0.50 packs/day for 45 years  Types: Cigarettes    Quit date: 02/24/1979  . Smokeless tobacco: Never Used  . Alcohol Use: No  . Drug Use: No  . Sexual Activity: No   Other Topics Concern  . Not on file   Social History Narrative  . No narrative on file      ROS:  General: Negative for anorexia, weight loss, fever, chills, fatigue, weakness. Eyes: Negative for vision changes.  ENT: Negative for hoarseness, difficulty swallowing , nasal congestion. CV: Negative for chest pain, angina, palpitations, peripheral edema.  Respiratory: Negative for dyspnea at rest, cough, sputum, wheezing. C/O SOB/DOE GI: See history of present illness. GU:  Negative for dysuria, hematuria, urinary incontinence, urinary frequency, nocturnal urination.  MS: Negative for joint pain, low back pain.  Derm: Negative for rash or itching.  Neuro: Negative for weakness, abnormal sensation, seizure,  frequent headaches, memory loss, confusion.  Psych: Negative for anxiety, depression, suicidal ideation, hallucinations.  Endo: Negative for unusual weight change.  Heme: Negative for bruising or bleeding. Allergy: Negative for rash or hives.    Physical Examination:  BP 129/76  Pulse 71  Temp(Src) 98.1 F (36.7 C) (Oral)  Ht 5\' 6"  (1.676 m)  Wt 194 lb (87.998 kg)  BMI 31.33 kg/m2   General: Well-nourished, well-developed in no acute distress.  Head: Normocephalic, atraumatic.   Eyes: Conjunctiva pale, no icterus. Mouth: Oropharyngeal mucosa moist and pink , no lesions erythema or exudate. Neck: Supple without thyromegaly, masses, or lymphadenopathy.  Lungs: Clear to auscultation bilaterally.  Heart: Regular rate and rhythm, no murmurs rubs or gallops.  Abdomen: Bowel sounds are normal, nontender, nondistended, no hepatosplenomegaly or masses, no abdominal bruits or    hernia , no rebound or guarding.   Rectal: not performed Extremities: trace edema. No clubbing or deformities.  Neuro: Alert and oriented x 4 , grossly normal neurologically.  Skin: Warm and dry, no rash or jaundice.   Psych: Alert and cooperative, normal mood and affect.  Labs: Lab Results  Component Value Date   WBC 6.6 09/08/2013   HGB 13.8 09/08/2013   HCT 39.4 09/08/2013   MCV 90.2 09/08/2013   PLT 128* 09/08/2013   Lab Results  Component Value Date   CREATININE 1.19* 09/08/2013   BUN 23 09/08/2013   NA 141 09/08/2013   K 3.6* 09/08/2013   CL 101 09/08/2013   CO2 25 09/08/2013   Lab Results  Component Value Date   ALT 14 09/08/2013   AST 16 09/08/2013   ALKPHOS 119* 09/08/2013   BILITOT 0.7 09/08/2013   Lab Results  Component Value Date   TSH 0.352 06/09/2013     Imaging Studies: Dg Chest 2 View  09/08/2013   CLINICAL DATA:  Shortness of breath. Chronic diastolic heart failure. Hypertension. Former smoker.  EXAM: CHEST  2 VIEW  COMPARISON:  Chest radiograph 09/07/2013  FINDINGS: There is a left  chest wall dual lead pacemaker with leads in the expected location of the right atrium and right ventricle. Mild cardiomegaly is stable. Mediastinal and hilar contours are within normal limits. Pulmonary vascularity is normal patient is slightly kyphotic in position. The lungs are clear and appear emphysematous. No airspace disease, effusion, or pneumothorax.  Diffuse osteopenia of the bones.  No acute bony abnormality.  IMPRESSION: Suspect at emphysema.  No acute superimposed abnormality.  Stable mild cardiomegaly with dual lead pacemaker present.   Electronically Signed   By: Curlene Dolphin M.D.   On: 09/08/2013 20:22   Ct Angio Chest Pe  W/cm &/or Wo Cm  09/08/2013   CLINICAL DATA:  Shortness of breath for 3 days.  EXAM: CT ANGIOGRAPHY CHEST WITH CONTRAST  TECHNIQUE: Multidetector CT imaging of the chest was performed using the standard protocol during bolus administration of intravenous contrast. Multiplanar CT image reconstructions and MIPs were obtained to evaluate the vascular anatomy.  CONTRAST:  100mL OMNIPAQUE IOHEXOL 350 MG/ML SOLN  COMPARISON:  None.  FINDINGS: Technically adequate study with good opacification of the central and segmental pulmonary arteries. No focal filling defects are demonstrated. No evidence of significant pulmonary embolus.  Normal heart size. Coronary artery and aortic calcifications. Ascending thoracic aorta is dilated with maximal AP diameter of about 4 cm. No dissection. Mural plaque formation demonstrated throughout the aortic arch and descending aorta. Great vessel origins are patent. Esophagus is air-filled but not significantly distended. No significant lymphadenopathy in the chest.  Scattered emphysematous changes in the lungs. No focal consolidation or airspace disease. No pneumothorax. No pleural effusion. Airways appear patent.  Visualized portions of the upper abdominal organs are grossly unremarkable. Degenerative changes in the thoracic spine. No destructive bone  lesions are appreciated.  Review of the MIP images confirms the above findings.  IMPRESSION: No evidence of significant pulmonary embolus. Emphysematous changes in the lungs. Ascending aortic dilatation with AP diameter of 4 cm.   Electronically Signed   By: Lucienne Capers M.D.   On: 09/08/2013 21:24   Dg Chest Portable 1 View  09/07/2013   CLINICAL DATA:  Shortness of breath and fatigue, remote smoker, pacemaker  EXAM: PORTABLE CHEST - 1 VIEW  COMPARISON:  06/09/2013  FINDINGS: Left subclavian 2 lead pacer noted. Cardiomegaly present without associated edema or CHF. No focal pneumonia, collapse or consolidation. Negative for effusion or pneumothorax. Trachea midline. Bones appear osteopenic. Overall stable exam.  IMPRESSION: cardiomegaly without superimposed acute process.   Electronically Signed   By: Daryll Brod M.D.   On: 09/07/2013 09:19

## 2013-10-17 NOTE — Op Note (Signed)
[facility] [address] [place], [address]   COLONOSCOPY PROCEDURE REPORT  PATIENT: Kari Hall, Kari Hall  MR#:         [id number] BIRTHDATE: 10/27/1934 , 79  yrs. old GENDER: Female ENDOSCOPIST: R.  Garfield Cornea, MD FACP FACG REFERRED BY:  Kerin Perna, M.D. PROCEDURE DATE:  10/17/2013 PROCEDURE:     Ileocolonoscopy with biopsy and snare polypectomy  INDICATIONS: Change in bowel habits; last colonoscopy 2003  INFORMED CONSENT:  The risks, benefits, alternatives and imponderables including but not limited to bleeding, perforation as well as the possibility of a missed lesion have been reviewed.  The potential for biopsy, lesion removal, etc. have also been discussed.  Questions have been answered.  All parties agreeable. Please see the history and physical in the medical record for more information.  MEDICATIONS: Versed 2.5 mg IV and Demerol 50 mg IV in divided doses. Zofran 4 mg IV  DESCRIPTION OF PROCEDURE:  After a digital rectal exam was performed, the EC-[id number] ([id number])  colonoscope was advanced from the anus through the rectum and colon to the area of the cecum, ileocecal valve and appendiceal orifice.  The cecum was deeply intubated.  These structures were well-seen and photographed for the record.  From the level of the cecum and ileocecal valve, the scope was slowly and cautiously withdrawn.  The mucosal surfaces were carefully surveyed utilizing scope tip deflection to facilitate fold flattening as needed.  The scope was pulled down into the rectum where a thorough examination including retroflexion was performed.    FINDINGS:  Adequate preparation. Normal rectum. Pancolonic diverticulosis. (1) 7 mm polyp at the splenic flexure and (1) diminutive polyp on the distal side of ileocecal valve; otherwise, the remainder of colonic mucosa appeared normal.  THERAPEUTIC / DIAGNOSTIC MANEUVERS PERFORMED:  The above-mentioned polyps were hot snared and cold  biopsy/ removed, respectively.  COMPLICATIONS: none  CECAL WITHDRAWAL TIME:  7 minutes  IMPRESSION:  Colonic diverticulosis. Multiple colonic polyps-removed as described above  RECOMMENDATIONS: Followup on pathology. Begin Benefiber 2 teaspoons twice daily. See EGD report.   _______________________________ eSigned:  R. Garfield Cornea, MD FACP [facility] 10/17/2013 10:07 AM   CC:    PATIENT NAME:  Kari Hall, Kari Hall MR#: [id number]

## 2013-10-17 NOTE — Progress Notes (Signed)
Fleets enema x1 done. Returned clear yellow stool. Tolerated well.

## 2013-10-17 NOTE — Op Note (Signed)
[facility] [address] [place], [address]   ENDOSCOPY PROCEDURE REPORT  PATIENT: Kari, Hall  MR#: [id number] BIRTHDATE: Sep 21, 1934 , 79  yrs. old GENDER: Female ENDOSCOPIST: R.  Garfield Cornea, MD FACP FACG REFERRED BY:  Kerin Perna, M.D. PROCEDURE DATE:  10/17/2013 PROCEDURE:     Diagnostic EGD  INDICATIONS:     epigastric pain; nausea  INFORMED CONSENT:   The risks, benefits, limitations, alternatives and imponderables have been discussed.  The potential for biopsy, esophogeal dilation, etc. have also been reviewed.  Questions have been answered.  All parties agreeable.  Please see the history and physical in the medical record for more information.  MEDICATIONS: Versed 2 mg IV and Demerol 50 mg IV. Xylocaine gel orally. Zofran 4 mg IV.  DESCRIPTION OF PROCEDURE:   The EG-[id number] ([id number])  endoscope was introduced through the mouth and advanced to the second portion of the duodenum without difficulty or limitations.  The mucosal surfaces were surveyed very carefully during advancement of the scope and upon withdrawal.  Retroflexion view of the proximal stomach and esophagogastric junction was performed.      FINDINGS:   Noncritical Schatzki's ring; otherwise normal esophagus. Stomach empty. 3 cm hiatal hernia. Normal gastric mucosa Patent pylorus. Normal first and second portion of the duodenum.  THERAPEUTIC / DIAGNOSTIC MANEUVERS PERFORMED:  none   COMPLICATIONS:  None  IMPRESSION:   Noncritical Schatzki's ring-not manipulated (no dysphagia). Hiatal hernia  RECOMMENDATIONS:   Continue Prilosec 20 mg daily; see colonoscopy report.    _______________________________ R. Garfield Cornea, MD FACP [facility] eSigned:  R. Garfield Cornea, MD FACP [facility] 10/17/2013 9:47 AM     CC:

## 2013-10-20 ENCOUNTER — Encounter (HOSPITAL_COMMUNITY): Payer: Self-pay | Admitting: Emergency Medicine

## 2013-10-20 ENCOUNTER — Emergency Department (HOSPITAL_COMMUNITY)
Admission: EM | Admit: 2013-10-20 | Discharge: 2013-10-20 | Disposition: A | Discharge status: Home / Self Care | Payer: Medicare HMO | Attending: Emergency Medicine | Admitting: Emergency Medicine

## 2013-10-20 DIAGNOSIS — T4995XA Adverse effect of unspecified topical agent, initial encounter: Secondary | ICD-10-CM | POA: Diagnosis not present

## 2013-10-20 DIAGNOSIS — K219 Gastro-esophageal reflux disease without esophagitis: Secondary | ICD-10-CM | POA: Insufficient documentation

## 2013-10-20 DIAGNOSIS — Z87891 Personal history of nicotine dependence: Secondary | ICD-10-CM | POA: Diagnosis not present

## 2013-10-20 DIAGNOSIS — T887XXA Unspecified adverse effect of drug or medicament, initial encounter: Secondary | ICD-10-CM | POA: Insufficient documentation

## 2013-10-20 DIAGNOSIS — Z950 Presence of cardiac pacemaker: Secondary | ICD-10-CM | POA: Insufficient documentation

## 2013-10-20 DIAGNOSIS — F411 Generalized anxiety disorder: Secondary | ICD-10-CM | POA: Diagnosis present

## 2013-10-20 DIAGNOSIS — Z79899 Other long term (current) drug therapy: Secondary | ICD-10-CM | POA: Diagnosis not present

## 2013-10-20 DIAGNOSIS — F419 Anxiety disorder, unspecified: Secondary | ICD-10-CM

## 2013-10-20 DIAGNOSIS — E039 Hypothyroidism, unspecified: Secondary | ICD-10-CM | POA: Insufficient documentation

## 2013-10-20 DIAGNOSIS — Z7982 Long term (current) use of aspirin: Secondary | ICD-10-CM | POA: Diagnosis not present

## 2013-10-20 DIAGNOSIS — I5032 Chronic diastolic (congestive) heart failure: Secondary | ICD-10-CM | POA: Insufficient documentation

## 2013-10-20 DIAGNOSIS — I10 Essential (primary) hypertension: Secondary | ICD-10-CM | POA: Insufficient documentation

## 2013-10-20 HISTORY — DX: Anxiety disorder, unspecified: F41.9

## 2013-10-20 NOTE — ED Notes (Addendum)
Pt presents to the ED stating "I want to talk to my MD about my medicine." Pt states she ran out of a "little white pill last night." Pt denies knowledge of what medication it is. Pt states she is shaking and vomiting because she is out of this medication. Pt also states "I feel like I am going to die," and "I got to get off of this stuff." NAD noted in triage.

## 2013-10-20 NOTE — Discharge Instructions (Signed)
°Emergency Department Resource Guide °1) Find a Doctor and Pay Out of Pocket °Although you won't have to find out who is covered by your insurance plan, it is a good idea to ask around and get recommendations. You will then need to call the office and see if the doctor you have chosen will accept you as a new patient and what types of options they offer for patients who are self-pay. Some doctors offer discounts or will set up payment plans for their patients who do not have insurance, but you will need to ask so you aren't surprised when you get to your appointment. ° °2) Contact Your Local Health Department °Not all health departments have doctors that can see patients for sick visits, but many do, so it is worth a call to see if yours does. If you don't know where your local health department is, you can check in your phone book. The CDC also has a tool to help you locate your state's health department, and many state websites also have listings of all of their local health departments. ° °3) Find a Walk-in Clinic °If your illness is not likely to be very severe or complicated, you may want to try a walk in clinic. These are popping up all over the country in pharmacies, drugstores, and shopping centers. They're usually staffed by nurse practitioners or physician assistants that have been trained to treat common illnesses and complaints. They're usually fairly quick and inexpensive. However, if you have serious medical issues or chronic medical problems, these are probably not your best option. ° °No Primary Care Doctor: °- Call [facility] at  832-8000 - they can help you locate a primary care doctor that  accepts your insurance, provides certain services, etc. °- Physician Referral Service- 1-800-533-3463 ° °Chronic Pain Problems: °Organization         Address  Phone   Notes  °[facility]  (336) 297-2271 Patients need to be referred by their primary care doctor.  ° °Medication  Assistance: °Organization         Address  Phone   Notes  °[place] [facility] [address] °[place], [place] [address] (336) 641-8030 --Must be a resident of [place] °-- Must have NO insurance coverage whatsoever (no Medicaid/ Medicare, etc.) °-- The pt. MUST have a primary care doctor that directs their care regularly and follows them in the community °  °[facility]  (866) 331-1348   °[employer]  (888) 892-1162   ° °Agencies that provide inexpensive medical care: °Organization         Address  Phone   Notes  °[facility]  (336) 832-8035   °[facility]    (336) 832-7272   °[facility] [address] °[place],  [address] (336) 832-4777   °[facility] [address], °[place] (336) 271-4999   °[facility]    (336) 373-0678   °[facility]    (336) 272-1050   °[facility] ° [address], [place] Phone:  (336) 832-4444, Fax:  (336) 832-4440 Hours of Operation:  9 am - 6 pm, M-F.  Also accepts Medicaid/Medicare and self-pay.  °[facility] ° [address], [place] Phone: (336) 832-3150, Fax: (336) 832-3151. Hours of Operation:  8:30 am - 5:30 pm, M-F.  Also accepts Medicaid and self-pay.  °[facility] [place] [address], [place] Phone: (336) 878-6027   °[facility] [address], [place], [place] (336)723-1848, Ext. 123 Mondays & Thursdays: 7-9 AM.  First 15 patients are seen on a first come, first serve basis. °  ° °Medicaid-accepting [place] Providers: ° °Organization         Address  Phone   Notes  °[facility] [address], [place] (336) 641-2100 Also accepts self-pay patients.  °[facility] [address], [place] ° (336) 856-9996   °[facility] [address], [place]  (336) 288-8857   °[facility] [address] [place] [address], [place] (336) 299-7000   °Veita Bland [address], [place]  ° (336) 373-1557 Only accepts [place] Access Medicaid patients after they have their name applied to their card.  ° °Self-Pay (no insurance) in [place]: ° °Organization         Address  Phone   Notes  °Sickle Cell Patients, [facility] [address], [place] (336) 832-1970   °[facility] [address], [place] (336) 832-4400   °[facility] ° [address], [place] (336) 992-4800   °[facility]/Dr. Osei-Bonsu ° [address] [place] [address], [place] or [address], [place] (336) 841-8500 Phone number for both [place] and [place] locations is the same.  °[facility] [address], [place] (336) 299-0000   °[facility] [address] [place] [address], [place] or [address] (336) 852-7530 °(336) 878-2260   °[facility] [address], [place] (336) 350-1642, phone; (336) 294-5005, fax Sees patients 1st and 3rd Saturday of every month.  Must not qualify for public or private insurance (i.e. Medicaid, Medicare, [place] Health Choice, Veterans' Benefits) • Household income should be no more than 200% of the poverty level •The clinic cannot treat you if you are pregnant or think you are pregnant • Sexually transmitted diseases are not treated at the clinic.  ° ° °Dental Care: °Organization         Address  Phone  Notes  °[place] [facility] [address], [place] (336) 641-6152 Accepts children up to age 21 who are enrolled in Medicaid or [place] Health Choice; pregnant women with a Medicaid card; and children who have applied for Medicaid or [place] Health Choice, but were declined, whose parents can pay a reduced fee at time of service.  °[place]  [facility] [place]  [address], [place] (336) 641-7733 Accepts children up to age 21 who are enrolled in Medicaid or [place] Health Choice; pregnant women with a Medicaid card; and children who have applied for Medicaid or  Health Choice, but were declined, whose parents can pay a reduced fee at time of service.  °[facility] ° [address], [place] (336) 641-4533 Patients are seen by appointment only. Walk-ins are not accepted. [facility] will see patients 18 years of age and older. °Monday - Tuesday (8am-5pm) °Most Wednesdays (8:30-5pm) °$30 per visit, cash only  °[facility] ° [address], [place] (336) 641-4533 Patients are seen by appointment only. Walk-ins are not accepted. [facility] will see patients 18 years of age and older. °One   Wednesday Evening (Monthly: Volunteer Based).  $30 per visit, cash only  °[facility]  (919) 537-3737 for adults; Children under age 4, call Graduate Pediatric Dentistry at (919) [phone]. Children aged 4-14, please call (919) [phone] to request a pediatric application. ° Dental services are provided in all areas of dental care including fillings, crowns and bridges, complete and partial dentures, implants, gum treatment, root canals, and extractions. Preventive care is also provided. Treatment is provided to both adults and children. °Patients are selected via a lottery and there is often a waiting list. °  °[facility] [address], °[place] ° (336) 763-8833 [email or web address] °  °[facility] [address], [place], [place] (336)723-1848, Ext. 123 Second and Fourth Thursday of each month, opens at 6:30 AM; Clinic ends at 9 AM.  Patients are seen on a first-come first-served basis, and a limited number are seen during each clinic.  ° °[facility] ° [address], [place], [place] (336) 723-7904    Eligibility Requirements °You must have lived in [place], [place], or [place] counties for at least the last three months. °  You cannot be eligible for state or federal sponsored healthcare insurance, including Veterans Administration, Medicaid, or Medicare. °  You generally cannot be eligible for healthcare insurance through your employer.  °  How to apply: °Eligibility screenings are held every Tuesday and Wednesday afternoon from 1:00 pm until 4:00 pm. You do not need an appointment for the interview!  °[facility] [address], [place], [place] 336-631-2330   °[facility]  336-342-8273   °[facility]  336-703-3100   °[facility]  336-570-6415   ° °Behavioral Health Resources in the Community: °Intensive Outpatient Programs °Organization         Address  Phone  Notes  °[facility] [address], [place], [place] 336-878-6098   °[facility] [address], [place], [place] 336-832-9800   °[facility] [address], [place], [place] ° 336-882-2125   °[facility] [address],  °[place], [place] 1-800-853-5163 or 336-641-4981   °Substance Abuse Resources °Organization         Address  Phone  Notes  °[facility]  336-882-2125   °[facility]  336-784-9470   °[facility]  336-285-9073   °[facility]  336-845-3988   °Residential & Outpatient Substance Abuse Program  1-800-659-3381   °Psychological Services °Organization         Address  Phone  Notes  °[facility]  336- 832-9600   °[facility]  336- 378-7881   °[facility] [address], [place] 1-800-853-5163 or 336-641-4981   ° °Mobile Crisis Teams °Organization         Address  Phone  Notes  °[facility], Mobile Crisis Care Unit  1-877-626-1772   °Assertive °[facility] ° [address]  [place], [place] 336-834-9664   °Sharon DeEsch [address] °[place] 336-554-5454   ° °Self-Help/Support Groups °Organization         Address  Phone             Notes  °[facility] - variety of support groups  336- 373-1402 Call for more information  °Narcotics Anonymous (NA), [facility] [address], °[place]   2 meetings at this location  ° °  Residential Treatment Programs Organization         Address  Phone  Notes  [facility] [address],    [place]  1-(947) 773-0860   [facility]  [address] [place] [address], [place], [place]   [address], [place] 234-045-0415 Admissions: 8am-3pm M-F  [facility] [address],    [place], [place] 248-250-0370   [facility] [address] [place], [place], [place]   [facility] [address],  [place], [place]   [facility] - Intensive Outpatient [address], [place], [place]   [facility] ([place] [address]  [place], [place] 1-513 156 4705 or 661-568-3724   [facility] ([facility] [address] [place], [place] Accepts Medicaid  [facility] [place] [address]  [place] 1-(701)433-9327 Substance Abuse/Addiction Treatment   [facility]         Address  Phone  Notes  [facility]  419-845-5956   Domenic Schwab, PhD [address] [place], [place]   3184973327 or 850-268-9669   [place], [place] 209-278-4338   [facility] [address], [place], [place] (873) [phone] Insurance/Medicaid/sponsorship through [facility] [address] [place]                                    [place], [place] [phone] [facility] [address], [place] [phone]    Dr. Adele Schilder  [phone]   [facility] Dept. 1) [address] [place] 2) [place] 3)  [address], [place] [phone]  [phone]   Home [phone] or [phone] (After Hours)      Take your usual prescriptions as previously directed.  Call your regular medical doctor tomorrow to schedule a follow up appointment within the next 1 to 2 days.  Return to the Emergency Department immediately sooner if worsening.

## 2013-10-20 NOTE — ED Provider Notes (Signed)
CSN: [id number]     Arrival date & time 10/20/13  0810 History   First MD Initiated Contact with Patient 10/20/13 0831     Chief Complaint  Patient presents with  . Medication Reaction      HPI Pt was seen at [facility]. Per pt, c/o gradual onset and persistence of constant "needing my white pill" since last night. Pt states she "ran out of it" and "needs it." Cannot further describe this medication. Denies CP/SOB, no abd pain, no N/V/D, no back pain, no SI/SA, no HI, no hallucinations.    Past Medical History  Diagnosis Date  . GERD (gastroesophageal reflux disease)   . Dyslipidemia   . Essential hypertension, benign   . Hypothyroidism   . IBS (irritable bowel syndrome)   . Diverticulitis   . Sinoatrial node dysfunction   . Chronic diastolic heart failure   . Pacemaker   . Anxiety    Past Surgical History  Procedure Laterality Date  . Cholecystectomy    . Appendectomy    . Colectomy      secondary to diverticulitis  . Ectopic pregnancy surgery    . Pacemaker placement    . Abdominal hysterectomy    . Colonoscopy  2003    Dr. Arnoldo Morale: few scattered diverticula in transverse and descending colon  . ?thyroid surgery?     Family History  Problem Relation Age of Onset  . Stroke Mother   . Emphysema Father   . Colon cancer Neg Hx    History  Substance Use Topics  . Smoking status: Former Smoker -- 0.50 packs/day for 45 years    Types: Cigarettes    Quit date: 02/24/1979  . Smokeless tobacco: Never Used  . Alcohol Use: No   OB History   Grav Para Term Preterm Abortions TAB SAB Ect Mult Living   3 3 3       3      Review of Systems ROS: Statement: All systems negative except as marked or noted in the HPI; Constitutional: Negative for fever and chills. ; ; Eyes: Negative for eye pain, redness and discharge. ; ; ENMT: Negative for ear pain, hoarseness, nasal congestion, sinus pressure and sore throat. ; ; Cardiovascular: Negative for chest pain, palpitations, diaphoresis,  dyspnea and peripheral edema. ; ; Respiratory: Negative for cough, wheezing and stridor. ; ; Gastrointestinal: Negative for nausea, vomiting, diarrhea, abdominal pain, blood in stool, hematemesis, jaundice and rectal bleeding. . ; ; Genitourinary: Negative for dysuria, flank pain and hematuria. ; ; Musculoskeletal: Negative for back pain and neck pain. Negative for swelling and trauma.; ; Skin: Negative for pruritus, rash, abrasions, blisters, bruising and skin lesion.; ; Neuro: Negative for headache, lightheadedness and neck stiffness. Negative for weakness, altered level of consciousness , altered mental status, extremity weakness, paresthesias, involuntary movement, seizure and syncope.; Psych:  +anxiety. No SI, no SA, no HI, no hallucinations.     Allergies  Review of patient's allergies indicates no known allergies.  Home Medications   Prior to Admission medications   Medication Sig Start Date End Date Taking? Authorizing Provider  acetaminophen (TYLENOL) 500 MG tablet Take 1,000 mg by mouth every 6 (six) hours as needed for moderate pain.   Yes Historical Provider, MD  aspirin EC 81 MG tablet Take 81 mg by mouth daily.   Yes Historical Provider, MD  atenolol (TENORMIN) 50 MG tablet Take 50 mg by mouth daily.     Yes Historical Provider, MD  clonazePAM (KLONOPIN) 0.5 MG tablet  Take 0.5 mg by mouth 2 (two) times daily as needed for anxiety.   Yes Historical Provider, MD  furosemide (LASIX) 20 MG tablet Take 20 mg by mouth daily.  03/14/13  Yes Lendon Colonel, NP  levothyroxine (SYNTHROID, LEVOTHROID) 100 MCG tablet Take 100 mcg by mouth daily before breakfast.   Yes Historical Provider, MD  omeprazole (PRILOSEC) 20 MG capsule Take 20 mg by mouth daily.   Yes Historical Provider, MD  potassium chloride SA (K-DUR,KLOR-CON) 20 MEQ tablet Take 40 mEq by mouth daily.    Yes Historical Provider, MD  QUEtiapine (SEROQUEL) 100 MG tablet Take 100 mg by mouth 2 (two) times daily.   Yes Historical  Provider, MD  Vilazodone HCl (VIIBRYD) 20 MG TABS Take 20 mg by mouth daily.    Historical Provider, MD   BP 170/86  Pulse 90  Temp(Src) 98.4 F (36.9 C) (Oral)  Resp 14  Ht 5\' 6"  (1.676 m)  Wt 180 lb (81.647 kg)  BMI 29.07 kg/m2  SpO2 96% Physical Exam 0845: Physical examination:  Nursing notes reviewed; Vital signs and O2 SAT reviewed;  Constitutional: Well developed, Well nourished, Well hydrated, In no acute distress; Head:  Normocephalic, atraumatic; Eyes: EOMI, PERRL, No scleral icterus; ENMT: Mouth and pharynx normal, Mucous membranes moist; Neck: Supple, Full range of motion, No lymphadenopathy; Cardiovascular: Regular rate and rhythm, No gallop; Respiratory: Breath sounds clear & equal bilaterally, No wheezes.  Speaking full sentences with ease, Normal respiratory effort/excursion; Chest: Nontender, Movement normal; Abdomen: Soft, Nontender, Nondistended, Normal bowel sounds; Genitourinary: No CVA tenderness; Extremities: Pulses normal, No tenderness, No edema, No calf edema or asymmetry.; Neuro: AA&Ox3, Major CN grossly intact.  Speech clear. No gross focal motor or sensory deficits in extremities. Climbs on and off stretcher easily by herself. Gait steady.; Skin: Color normal, Warm, Dry.; Psych:  Easily agitated when talking about "needing my white pill." No SI, HI, or hallucinations.    ED Course  Procedures     EKG Interpretation None      MDM  MDM Reviewed: previous chart, nursing note and vitals    0900:  Pt requesting "the white pill, I need the white pill." EPIC chart reviewed: multiple notes have family controlling her ativan dosing due to concerns regarding pt "taking too much ativan" on her own, as well as pt's PMD stopping her ativan and starting viibrid several weeks ago. D/W pt regarding need to f/u with PMD and family regarding her ativan use and prescriptions. Verb understanding. States she "just wants to go home then" and "will talk with my doctor tomorrow  about it."   1000:  Family (daughter) called ED: state pt is on ativan rx by Dr. Luan Pulling, pt's daughter holds and dispenses this medication to pt, and pt is not "out of" this medication. Family voices no other concerns at this time. Will d/c pt stable.    Francine Graven, DO 10/21/13 1658

## 2013-10-20 NOTE — ED Notes (Signed)
Pt daughter called to ED and stated that patient is on Ativan prescribed by Dr.Hawkins and she (daughter) holds and dispenses medication to pt. Pt is not out of medication at this time.

## 2013-10-20 NOTE — ED Notes (Addendum)
Pt states she just wants to go home and lay down.

## 2013-10-21 ENCOUNTER — Emergency Department (HOSPITAL_COMMUNITY)
Admission: EM | Admit: 2013-10-21 | Discharge: 2013-10-23 | Disposition: A | Discharge status: Other Acute Inpatient Hospital | Payer: Medicare HMO | Attending: Emergency Medicine | Admitting: Emergency Medicine

## 2013-10-21 ENCOUNTER — Encounter (HOSPITAL_COMMUNITY): Payer: Self-pay | Admitting: Emergency Medicine

## 2013-10-21 DIAGNOSIS — F22 Delusional disorders: Secondary | ICD-10-CM | POA: Insufficient documentation

## 2013-10-21 DIAGNOSIS — F3289 Other specified depressive episodes: Secondary | ICD-10-CM | POA: Insufficient documentation

## 2013-10-21 DIAGNOSIS — E039 Hypothyroidism, unspecified: Secondary | ICD-10-CM | POA: Insufficient documentation

## 2013-10-21 DIAGNOSIS — Z79899 Other long term (current) drug therapy: Secondary | ICD-10-CM | POA: Insufficient documentation

## 2013-10-21 DIAGNOSIS — I5032 Chronic diastolic (congestive) heart failure: Secondary | ICD-10-CM | POA: Insufficient documentation

## 2013-10-21 DIAGNOSIS — Z87891 Personal history of nicotine dependence: Secondary | ICD-10-CM | POA: Insufficient documentation

## 2013-10-21 DIAGNOSIS — Z7982 Long term (current) use of aspirin: Secondary | ICD-10-CM | POA: Diagnosis not present

## 2013-10-21 DIAGNOSIS — F329 Major depressive disorder, single episode, unspecified: Secondary | ICD-10-CM | POA: Diagnosis not present

## 2013-10-21 DIAGNOSIS — R4182 Altered mental status, unspecified: Secondary | ICD-10-CM | POA: Diagnosis not present

## 2013-10-21 DIAGNOSIS — Z008 Encounter for other general examination: Secondary | ICD-10-CM | POA: Insufficient documentation

## 2013-10-21 DIAGNOSIS — Z862 Personal history of diseases of the blood and blood-forming organs and certain disorders involving the immune mechanism: Secondary | ICD-10-CM | POA: Insufficient documentation

## 2013-10-21 DIAGNOSIS — R443 Hallucinations, unspecified: Secondary | ICD-10-CM

## 2013-10-21 DIAGNOSIS — I10 Essential (primary) hypertension: Secondary | ICD-10-CM | POA: Diagnosis not present

## 2013-10-21 DIAGNOSIS — Z8639 Personal history of other endocrine, nutritional and metabolic disease: Secondary | ICD-10-CM | POA: Diagnosis not present

## 2013-10-21 DIAGNOSIS — F411 Generalized anxiety disorder: Secondary | ICD-10-CM | POA: Insufficient documentation

## 2013-10-21 DIAGNOSIS — H5316 Psychophysical visual disturbances: Secondary | ICD-10-CM | POA: Diagnosis not present

## 2013-10-21 DIAGNOSIS — K219 Gastro-esophageal reflux disease without esophagitis: Secondary | ICD-10-CM | POA: Insufficient documentation

## 2013-10-21 DIAGNOSIS — Z950 Presence of cardiac pacemaker: Secondary | ICD-10-CM | POA: Diagnosis not present

## 2013-10-21 DIAGNOSIS — I495 Sick sinus syndrome: Secondary | ICD-10-CM | POA: Diagnosis not present

## 2013-10-21 LAB — CBC WITH DIFFERENTIAL/PLATELET
Basophils Absolute: 0 10*3/uL (ref 0.0–0.1)
Basophils Relative: 1 % (ref 0–1)
Eosinophils Absolute: 0.1 10*3/uL (ref 0.0–0.7)
Eosinophils Relative: 2 % (ref 0–5)
HCT: 40.2 % (ref 36.0–46.0)
Hemoglobin: 13.7 g/dL (ref 12.0–15.0)
LYMPHS ABS: 1.2 10*3/uL (ref 0.7–4.0)
Lymphocytes Relative: 23 % (ref 12–46)
MCH: 31.6 pg (ref 26.0–34.0)
MCHC: 34.1 g/dL (ref 30.0–36.0)
MCV: 92.6 fL (ref 78.0–100.0)
Monocytes Absolute: 0.5 10*3/uL (ref 0.1–1.0)
Monocytes Relative: 10 % (ref 3–12)
NEUTROS PCT: 64 % (ref 43–77)
Neutro Abs: 3.2 10*3/uL (ref 1.7–7.7)
Platelets: 140 10*3/uL — ABNORMAL LOW (ref 150–400)
RBC: 4.34 MIL/uL (ref 3.87–5.11)
RDW: 13.4 % (ref 11.5–15.5)
WBC: 5 10*3/uL (ref 4.0–10.5)

## 2013-10-21 LAB — BASIC METABOLIC PANEL
ANION GAP: 10 (ref 5–15)
BUN: 8 mg/dL (ref 6–23)
CO2: 27 mEq/L (ref 19–32)
Calcium: 8.9 mg/dL (ref 8.4–10.5)
Chloride: 108 mEq/L (ref 96–112)
Creatinine, Ser: 1.07 mg/dL (ref 0.50–1.10)
GFR calc Af Amer: 57 mL/min — ABNORMAL LOW (ref >90)
GFR, EST NON AFRICAN AMERICAN: 49 mL/min — AB (ref >90)
Glucose, Bld: 103 mg/dL — ABNORMAL HIGH (ref 70–99)
Potassium: 4.1 mEq/L (ref 3.7–5.3)
Sodium: 145 mEq/L (ref 137–147)

## 2013-10-21 LAB — RAPID URINE DRUG SCREEN, HOSP PERFORMED
AMPHETAMINES: NOT DETECTED
Barbiturates: NOT DETECTED
Benzodiazepines: NOT DETECTED
Cocaine: NOT DETECTED
OPIATES: NOT DETECTED
Tetrahydrocannabinol: NOT DETECTED

## 2013-10-21 LAB — ETHANOL

## 2013-10-21 MED ORDER — PANTOPRAZOLE SODIUM 40 MG PO TBEC
40.0000 mg | DELAYED_RELEASE_TABLET | Freq: Every day | ORAL | Status: DC
  Administered 2013-10-21 – 2013-10-23 (×3): 40 mg via ORAL
  Filled 2013-10-21 (×3): qty 1

## 2013-10-21 MED ORDER — CLONAZEPAM 0.5 MG PO TABS
0.5000 mg | ORAL_TABLET | Freq: Two times a day (BID) | ORAL | Status: DC | PRN
  Administered 2013-10-21 – 2013-10-23 (×2): 0.5 mg via ORAL
  Filled 2013-10-21 (×2): qty 1

## 2013-10-21 MED ORDER — ATENOLOL 25 MG PO TABS
50.0000 mg | ORAL_TABLET | Freq: Every day | ORAL | Status: DC
  Administered 2013-10-21 – 2013-10-23 (×3): 50 mg via ORAL
  Filled 2013-10-21 (×3): qty 2

## 2013-10-21 MED ORDER — ASPIRIN EC 81 MG PO TBEC
81.0000 mg | DELAYED_RELEASE_TABLET | Freq: Every day | ORAL | Status: DC
  Administered 2013-10-21 – 2013-10-23 (×3): 81 mg via ORAL
  Filled 2013-10-21 (×3): qty 1

## 2013-10-21 MED ORDER — VILAZODONE HCL 20 MG PO TABS
20.0000 mg | ORAL_TABLET | Freq: Every day | ORAL | Status: DC
  Filled 2013-10-21 (×3): qty 1

## 2013-10-21 MED ORDER — ACETAMINOPHEN 500 MG PO TABS
1000.0000 mg | ORAL_TABLET | Freq: Four times a day (QID) | ORAL | Status: DC | PRN
  Administered 2013-10-23 (×3): 1000 mg via ORAL
  Filled 2013-10-21 (×3): qty 2

## 2013-10-21 MED ORDER — FUROSEMIDE 40 MG PO TABS
20.0000 mg | ORAL_TABLET | Freq: Every day | ORAL | Status: DC
  Administered 2013-10-21 – 2013-10-23 (×3): 20 mg via ORAL
  Filled 2013-10-21 (×3): qty 1

## 2013-10-21 MED ORDER — LEVOTHYROXINE SODIUM 100 MCG PO TABS
100.0000 ug | ORAL_TABLET | Freq: Every day | ORAL | Status: DC
  Administered 2013-10-22 – 2013-10-23 (×2): 100 ug via ORAL
  Filled 2013-10-21 (×7): qty 1

## 2013-10-21 MED ORDER — POTASSIUM CHLORIDE CRYS ER 20 MEQ PO TBCR
60.0000 meq | EXTENDED_RELEASE_TABLET | Freq: Every day | ORAL | Status: DC
  Administered 2013-10-21 – 2013-10-23 (×3): 60 meq via ORAL
  Filled 2013-10-21 (×3): qty 3

## 2013-10-21 NOTE — ED Notes (Signed)
Ava called from [facility].  Gave appt for telepsych 1300.  Machine at bedside
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
## 2013-10-21 NOTE — Progress Notes (Signed)
Writer contacted [facility] and they have not received a referral therefore,the patient has not been accepted.  Writer faxed referrals to the following facilities:  [facility].  [facility]. [facility]

## 2013-10-21 NOTE — ED Notes (Signed)
Pt is upset about admission to [facility].  Family states that she is refusing to talk to them.  Family asking if we can place pt somewhere "closer", explained to pt that [facility] was for geriatric pt's .  Family at bedside with pt.

## 2013-10-21 NOTE — ED Notes (Signed)
Kari Hall, daughter and durable POA, [phone], [phone].

## 2013-10-21 NOTE — ED Notes (Signed)
Pt continues to hyperventilate and having increased anxiety. When asking pt questions, she breathes normal and answers appropriately. Encouraged deep breathing, pt says, " I just can't do that like you are telling me". Repositioned, comforted pt.

## 2013-10-21 NOTE — Progress Notes (Signed)
Writer consulted with the NP, Milta Deiters [phone]) regarding the patient receiving a Tele Psych.   Writer informed the nurse Advertising account planner) that the NP, Milta Deiters will be assessing the patient at 1:00pm

## 2013-10-21 NOTE — ED Notes (Signed)
telepsych being done 

## 2013-10-21 NOTE — BH Assessment (Signed)
Referral faxed to [facility].  Boyce Medici, MA  Disposition MHT

## 2013-10-21 NOTE — ED Notes (Signed)
CNA called RN to room. Pt had ambulated back and forth to restroom without difficulty. On return to room, pt started hyperventilating and saying she could not breath. After talking to patient and giving her water, pt laid down in the bed and appears more at ease. Will continue to assess.

## 2013-10-21 NOTE — Consult Note (Signed)
Telepsych Consultation   Reason for Consult:  Hallucinations Referring Physician:  EDMD Kari Hall is an 79 y.o. female.  Assessment: AXIS I:  Altered mental status uncertain etiology AXIS II:  Deferred AXIS III:   Past Medical History  Diagnosis Date  . GERD (gastroesophageal reflux disease)   . Dyslipidemia   . Essential hypertension, benign   . Hypothyroidism   . IBS (irritable bowel syndrome)   . Diverticulitis   . Sinoatrial node dysfunction   . Chronic diastolic heart failure   . Pacemaker   . Anxiety    AXIS IV:  housing problems, other psychosocial or environmental problems and problems related to social environment AXIS V:  11-20 some danger of hurting self or others possible OR occasionally fails to maintain minimal personal hygiene OR gross impairment in communication  Plan:  Recommend psychiatric Inpatient admission when medically cleared. 1. Would check TSH and new EKG. 2. Recommend Geriatric placement due to this patient's agitation and anxiety. 3. EDMD is notified, will contact TSS for placement. Rule out dementia vs delirium from Viibryd, vs cognitive decline.  Subjective:   Kari Hall is a 79 y.o. female patient admitted with confusion and intermittent visual hallucinations. This is her 4th ED visit in 8 days not including her recent endoscopy and colonoscopy on 10/17/2013 for melena.      Per patient's daughter she was prescribed Ativan (as needed) by her PCP but she was taking too many of them. They are now controlled by her daughter. Viibryd was also started for depression 2 weeks ago.        Patient is currently being evaluated via Telepsych at [facility]. She appears wild eyed and disheveled. She is disorganized and says that she is in [place], and assures me she is in [place]. She reports the reason she is in the hospital is because she had an argument with her daughter and said some things that she knows isn't like her.            She is off on the date by one  day, stating it is Monday, but thinks this is November. She is convinced she is in [place].  She denies any previous psychiatric history, admission or treatments. There is no psychiatric family history.          She does say that moving into a trailer to be near her daughter causes her to feel that she has lost something of herself. She denies SI/HI or AVH.           She does not recall going to Dr. Lilia Pro office, and her daughter states that she has gotten worse since she started the Viibryd, and worse even still after the dose was increased. The daughter also goes on to say that her mother was taking more of the Ativan than was prescribed, and that the Klonopin has made her sleep all day.             [name] states that her husband died about 6 months ago, but in reality he died in 15. She called the police yesterday because someone was fighting in her yard, but the police found no evidence of such.              HPI:  See abouve HPI Elements:   Location:  [facility]. Quality:  Acute. Severity:  severe. Timing:  on going. Duration:  worsening over the past month. Context:  behavioral changes, decrease in cognition, confusion, hallucinations.  Past Psychiatric  History: Past Medical History  Diagnosis Date  . GERD (gastroesophageal reflux disease)   . Dyslipidemia   . Essential hypertension, benign   . Hypothyroidism   . IBS (irritable bowel syndrome)   . Diverticulitis   . Sinoatrial node dysfunction   . Chronic diastolic heart failure   . Pacemaker   . Anxiety     reports that she quit smoking about 34 years ago. Her smoking use included Cigarettes. She has a 22.5 pack-year smoking history. She has never used smokeless tobacco. She reports that she does not drink alcohol or use illicit drugs. Family History  Problem Relation Age of Onset  . Stroke Mother   . Emphysema Father   . Colon cancer Neg Hx          Allergies:  No Known Allergies  ACT Assessment Complete:  No:    Past  Psychiatric History: Diagnosis:  None  Hospitalizations:  none  Outpatient Care:  Dr. Rosine Door x 1  Substance Abuse Care:  denies  Self-Mutilation:  denies  Suicidal Attempts:  denies  Homicidal Behaviors:  denies   Violent Behaviors:  denies   Place of Residence:  [place] Marital Status:  divorced Employed/Unemployed:  Retired Therapist, sports carrier Education:  Some college per patient Family Supports:  children Objective: Blood pressure 164/68, pulse 64, temperature 99.3 F (37.4 C), temperature source Oral, resp. rate 16, weight 180 lb (81.647 kg), SpO2 94.00%.Body mass index is 29.07 kg/(m^2). Results for orders placed during the hospital encounter of 10/21/13 (from the past 72 hour(s))  BASIC METABOLIC PANEL     Status: Abnormal   Collection Time    10/21/13  8:54 AM      Result Value Ref Range   Sodium 145  137 - 147 mEq/L   Potassium 4.1  3.7 - 5.3 mEq/L   Chloride 108  96 - 112 mEq/L   CO2 27  19 - 32 mEq/L   Glucose, Bld 103 (*) 70 - 99 mg/dL   BUN 8  6 - 23 mg/dL   Creatinine, Ser 1.07  0.50 - 1.10 mg/dL   Calcium 8.9  8.4 - 10.5 mg/dL   GFR calc non Af Amer 49 (*) >90 mL/min   GFR calc Af Amer 57 (*) >90 mL/min   Comment: (NOTE)     The eGFR has been calculated using the CKD EPI equation.     This calculation has not been validated in all clinical situations.     eGFR's persistently <90 mL/min signify possible Chronic Kidney     Disease.   Anion gap 10  5 - 15  CBC WITH DIFFERENTIAL     Status: Abnormal   Collection Time    10/21/13  8:54 AM      Result Value Ref Range   WBC 5.0  4.0 - 10.5 K/uL   RBC 4.34  3.87 - 5.11 MIL/uL   Hemoglobin 13.7  12.0 - 15.0 g/dL   HCT 40.2  36.0 - 46.0 %   MCV 92.6  78.0 - 100.0 fL   MCH 31.6  26.0 - 34.0 pg   MCHC 34.1  30.0 - 36.0 g/dL   RDW 13.4  11.5 - 15.5 %   Platelets 140 (*) 150 - 400 K/uL   Neutrophils Relative % 64  43 - 77 %   Neutro Abs 3.2  1.7 - 7.7 K/uL   Lymphocytes Relative 23  12 - 46 %   Lymphs Abs 1.2   0.7 - 4.0 K/uL  Monocytes Relative 10  3 - 12 %   Monocytes Absolute 0.5  0.1 - 1.0 K/uL   Eosinophils Relative 2  0 - 5 %   Eosinophils Absolute 0.1  0.0 - 0.7 K/uL   Basophils Relative 1  0 - 1 %   Basophils Absolute 0.0  0.0 - 0.1 K/uL  ETHANOL     Status: None   Collection Time    10/21/13  8:54 AM      Result Value Ref Range   Alcohol, Ethyl (B) <11  0 - 11 mg/dL   Comment:            LOWEST DETECTABLE LIMIT FOR     SERUM ALCOHOL IS 11 mg/dL     FOR MEDICAL PURPOSES ONLY  URINE RAPID DRUG SCREEN (HOSP PERFORMED)     Status: None   Collection Time    10/21/13  9:24 AM      Result Value Ref Range   Opiates NONE DETECTED  NONE DETECTED   Cocaine NONE DETECTED  NONE DETECTED   Benzodiazepines NONE DETECTED  NONE DETECTED   Amphetamines NONE DETECTED  NONE DETECTED   Tetrahydrocannabinol NONE DETECTED  NONE DETECTED   Barbiturates NONE DETECTED  NONE DETECTED   Comment:            DRUG SCREEN FOR MEDICAL PURPOSES     ONLY.  IF CONFIRMATION IS NEEDED     FOR ANY PURPOSE, NOTIFY LAB     WITHIN 5 DAYS.                LOWEST DETECTABLE LIMITS     FOR URINE DRUG SCREEN     Drug Class       Cutoff (ng/mL)     Amphetamine      1000     Barbiturate      200     Benzodiazepine   841     Tricyclics       324     Opiates          300     Cocaine          300     THC              50   Labs are reviewed and are pertinent for GFR decreased.  No current facility-administered medications for this encounter.   Current Outpatient Prescriptions  Medication Sig Dispense Refill  . acetaminophen (TYLENOL) 500 MG tablet Take 1,000 mg by mouth every 6 (six) hours as needed for moderate pain.      Marland Kitchen aspirin EC 81 MG tablet Take 81 mg by mouth daily.      Marland Kitchen atenolol (TENORMIN) 50 MG tablet Take 50 mg by mouth daily.       . clonazePAM (KLONOPIN) 0.5 MG tablet Take 0.5 mg by mouth 2 (two) times daily as needed for anxiety.      . furosemide (LASIX) 20 MG tablet Take 20 mg by mouth daily.        Marland Kitchen levothyroxine (SYNTHROID, LEVOTHROID) 100 MCG tablet Take 100 mcg by mouth daily before breakfast.      . LORazepam (ATIVAN) 0.5 MG tablet Take 0.5 mg by mouth every 8 (eight) hours as needed for anxiety.      Marland Kitchen omeprazole (PRILOSEC) 20 MG capsule Take 20 mg by mouth daily.      . potassium chloride SA (K-DUR,KLOR-CON) 20 MEQ tablet Take 60 mEq by mouth daily.       Marland Kitchen  Vilazodone HCl (VIIBRYD) 20 MG TABS Take 20 mg by mouth at bedtime.       . [DISCONTINUED] ezetimibe-simvastatin (VYTORIN) 10-40 MG per tablet Take 1 tablet by mouth at bedtime.          Psychiatric Specialty Exam:     Blood pressure 164/68, pulse 64, temperature 99.3 F (37.4 C), temperature source Oral, resp. rate 16, weight 180 lb (81.647 kg), SpO2 94.00%.Body mass index is 29.07 kg/(m^2).  General Appearance: Bizarre and Disheveled  Eye Contact::  Fair  Speech:  Normal Rate  confabulation  Volume:  Increased  Mood:  Irritable  Affect:  Non-Congruent  Thought Process:  Disorganized  Orientation:  Other:  not at all  Thought Content:  Hallucinations: Auditory  Suicidal Thoughts:  No  Homicidal Thoughts:  No  Memory:  Immediate;   Poor  Judgement:  Impaired  Insight:  Shallow  Psychomotor Activity:  Normal  Concentration:  Poor  Recall:  Poor  Akathisia:  No  Handed:  Right  AIMS (if indicated):     Assets:  Social Support  Sleep:      Treatment Plan Summary: recommend in patient psychiatric evaluation in Geriatric Unit   Disposition:   Milta Deiters T. Mashburn RPAC 1:52 PM 10/21/2013  Case, Presentation, Disposition recommendations discussed

## 2013-10-21 NOTE — ED Provider Notes (Signed)
CSN: [id number]     Arrival date & time 10/21/13  7371 History  This chart was scribed for Kari Christen, MD by Ludger Nutting, ED Scribe. This patient was seen in room [facility] and the patient's care was started 8:22 AM.    Chief Complaint  Patient presents with  . Medical Clearance    The history is provided by the patient and a relative. No language interpreter was used.    HPI Comments:  Level 5 caveat for confusion Kari Hall is a 79 y.o. female who presents to the Emergency Department complaining of 2 months of gradual onset, gradually worsened confusion and intermittent visual hallucinations. Per daughter, patient was started on Ativan 1 month ago and Viibryd for depression about 2 weeks ago. Daughter states the Ativan was prescribed as needed but patient was taking too many. Patient lives alone.   Past Medical History  Diagnosis Date  . GERD (gastroesophageal reflux disease)   . Dyslipidemia   . Essential hypertension, benign   . Hypothyroidism   . IBS (irritable bowel syndrome)   . Diverticulitis   . Sinoatrial node dysfunction   . Chronic diastolic heart failure   . Pacemaker   . Anxiety    Past Surgical History  Procedure Laterality Date  . Cholecystectomy    . Appendectomy    . Colectomy      secondary to diverticulitis  . Ectopic pregnancy surgery    . Pacemaker placement    . Abdominal hysterectomy    . Colonoscopy  2003    Dr. Arnoldo Morale: few scattered diverticula in transverse and descending colon  . ?thyroid surgery?    . Colonoscopy N/A 10/17/2013    Procedure: COLONOSCOPY;  Surgeon: Daneil Dolin, MD;  [facility]: [facility];  Service: Endoscopy;  Laterality: N/A;  9:30  . Esophagogastroduodenoscopy N/A 10/17/2013    Procedure: ESOPHAGOGASTRODUODENOSCOPY (EGD);  Surgeon: Daneil Dolin, MD;  Location: [facility];  Service: Endoscopy;  Laterality: N/A;   Family History  Problem Relation Age of Onset  . Stroke Mother   . Emphysema Father   . Colon cancer Neg  Hx    History  Substance Use Topics  . Smoking status: Former Smoker -- 0.50 packs/day for 45 years    Types: Cigarettes    Quit date: 02/24/1979  . Smokeless tobacco: Never Used  . Alcohol Use: No   OB History   Grav Para Term Preterm Abortions TAB SAB Ect Mult Living   3 3 3       3      Review of Systems  Unable to perform ROS: Psychiatric disorder    A complete 10 system review of systems was obtained and all systems are negative except as noted in the HPI and PMH.    Allergies  Review of patient's allergies indicates no known allergies.  Home Medications   Prior to Admission medications   Medication Sig Start Date End Date Taking? Authorizing Provider  acetaminophen (TYLENOL) 500 MG tablet Take 1,000 mg by mouth every 6 (six) hours as needed for moderate pain.   Yes Historical Provider, MD  aspirin EC 81 MG tablet Take 81 mg by mouth daily.   Yes Historical Provider, MD  atenolol (TENORMIN) 50 MG tablet Take 50 mg by mouth daily.    Yes Historical Provider, MD  clonazePAM (KLONOPIN) 0.5 MG tablet Take 0.5 mg by mouth 2 (two) times daily as needed for anxiety.   Yes Historical Provider, MD  furosemide (LASIX) 20  MG tablet Take 20 mg by mouth daily.  03/14/13  Yes Lendon Colonel, NP  levothyroxine (SYNTHROID, LEVOTHROID) 100 MCG tablet Take 100 mcg by mouth daily before breakfast.   Yes Historical Provider, MD  LORazepam (ATIVAN) 0.5 MG tablet Take 0.5 mg by mouth every 8 (eight) hours as needed for anxiety.   Yes Historical Provider, MD  omeprazole (PRILOSEC) 20 MG capsule Take 20 mg by mouth daily.   Yes Historical Provider, MD  potassium chloride SA (K-DUR,KLOR-CON) 20 MEQ tablet Take 60 mEq by mouth daily.    Yes Historical Provider, MD  Vilazodone HCl (VIIBRYD) 20 MG TABS Take 20 mg by mouth at bedtime.     Historical Provider, MD   BP 164/68  Pulse 64  Temp(Src) 99.3 F (37.4 C) (Oral)  Resp 16  Wt 180 lb (81.647 kg)  SpO2 94% Physical Exam  Nursing note and  vitals reviewed. Constitutional: She is oriented to person, place, and time. She appears well-developed and well-nourished.  HENT:  Head: Normocephalic and atraumatic.  Eyes: Conjunctivae and EOM are normal. Pupils are equal, round, and reactive to light.  Neck: Normal range of motion. Neck supple.  Cardiovascular: Normal rate, regular rhythm and normal heart sounds.   Pulmonary/Chest: Effort normal and breath sounds normal.  Abdominal: Soft. Bowel sounds are normal.  Musculoskeletal: Normal range of motion.  Neurological: She is alert and oriented to person, place, and time.  Orientated to person, place, and time  Skin: Skin is warm and dry.  Psychiatric: She exhibits a depressed mood.  Sad, flat affect. Thinking is disordered and chaotic    ED Course  Procedures (including critical care time)  DIAGNOSTIC STUDIES: Oxygen Saturation is 94% on RA, adequate by my interpretation.    COORDINATION OF CARE: 8:28 AM Will order a psych consult and lab work. Discussed treatment plan with pt at bedside and pt agreed to plan.   Labs Review Labs Reviewed  BASIC METABOLIC PANEL - Abnormal; Notable for the following:    Glucose, Bld 103 (*)    GFR calc non Af Amer 49 (*)    GFR calc Af Amer 57 (*)    All other components within normal limits  CBC WITH DIFFERENTIAL - Abnormal; Notable for the following:    Platelets 140 (*)    All other components within normal limits  ETHANOL  URINE RAPID DRUG SCREEN (HOSP PERFORMED)    Imaging Review No results found.   EKG Interpretation None      MDM   Final diagnoses:  Delusional disorder   Will consult behavioral health. Patient will most likely need to be admitted to a psychiatric facility  I personally performed the services described in this documentation, which was scribed in my presence. The recorded information has been reviewed and is accurate.   Kari Christen, MD 10/21/13 [phone]

## 2013-10-22 MED ORDER — VILAZODONE HCL 40 MG PO TABS
20.0000 mg | ORAL_TABLET | Freq: Every day | ORAL | Status: DC
  Administered 2013-10-22: 20 mg via ORAL
  Filled 2013-10-22 (×4): qty 0.5

## 2013-10-22 MED ORDER — LORAZEPAM 1 MG PO TABS
1.0000 mg | ORAL_TABLET | Freq: Three times a day (TID) | ORAL | Status: DC | PRN
  Administered 2013-10-22 – 2013-10-23 (×4): 1 mg via ORAL
  Filled 2013-10-22 (×4): qty 1

## 2013-10-22 NOTE — ED Notes (Signed)
[facility]  Called to check on Pt. He was informed still waiting placement and Pt was resting at this time.

## 2013-10-22 NOTE — ED Notes (Signed)
Pt was given ativan and spoke with Animal nutritionist.  Pt much more calm and relaxed at this time.

## 2013-10-22 NOTE — ED Notes (Signed)
Patient ambulatory to bathroom.

## 2013-10-22 NOTE — ED Provider Notes (Signed)
Pt stable, no distress, awaiting placement BP 138/73  Pulse 62  Temp(Src) 97.8 F (36.6 C) (Oral)  Resp 22  Wt 180 lb (81.647 kg)  SpO2 98%   Sharyon Cable, MD 10/22/13 2155

## 2013-10-22 NOTE — ED Notes (Signed)
Pt woke up suddenly and sat up in bed, reporting that she "cannot breath" Pt  hyperventilating.  Reassurance, encouraged to slow down her breathing. Denies pain. Pt ambulatory to restroom with stand by assist. Repositioned, blankets given. Will continue to monitor.

## 2013-10-22 NOTE — ED Notes (Signed)
Pt very agitated, requesting to speak to sheriff.  Security in room talking with pt.

## 2013-10-22 NOTE — Progress Notes (Signed)
[facility] at capacity.  Nurse reports that they will know about their discharges after 12 today.

## 2013-10-23 ENCOUNTER — Encounter: Payer: Self-pay | Admitting: Internal Medicine

## 2013-10-23 NOTE — ED Notes (Signed)
Spoke with Tonette Bihari at [facility], who had just been on the phone with Clide Deutscher who was requesting some further Demographics on Pt.  Info given.  Nurse informed.

## 2013-10-23 NOTE — ED Notes (Signed)
Pt c/o muscle pain in bilateral legs. Pt given tylenol and ativan to help her sleep.

## 2013-10-23 NOTE — BH Assessment (Signed)
[facility], per Cedrick they are at capacity right now, but are expecting discharges.  Rosana Hoes will call if there is an opening.   [facility], writer left a voice mail message regarding the status of the patients referral.    [facility], per Roney Mans they are expecting discharges and the nurse will call if there is a bed available.

## 2013-10-23 NOTE — BH Assessment (Signed)
Per Lilia Pro, once the hospital receives a faxed copy of the IVC the patient will be accepted to [facility].  Writer informed the Financial controller Joycelyn Schmid).  The fax number to [facility] is [phone]).

## 2013-10-23 NOTE — ED Notes (Signed)
Pt stated she needed something to calm her down, so patient was given klonopin.

## 2013-10-23 NOTE — ED Notes (Signed)
Pt complains of "feeling bad" and "can't breathe". Respiratory assessment done, everything was within normal limits. Dr. Lacinda Axon notified.

## 2013-10-23 NOTE — ED Provider Notes (Signed)
Patient is delusional and potentially harmful to self. Commitment papers signed by examiner  [name], MD 10/23/13 [phone]

## 2013-10-23 NOTE — ED Provider Notes (Signed)
Pt accepted to [place], [facility]. IVC in place. Will transfer stable.   Francine Graven, DO 10/23/13 1910

## 2013-10-27 ENCOUNTER — Ambulatory Visit: Payer: Commercial Managed Care - HMO | Admitting: Gastroenterology

## 2013-10-28 ENCOUNTER — Encounter: Payer: Self-pay | Admitting: Cardiology

## 2013-10-28 ENCOUNTER — Encounter: Payer: Medicare PPO | Admitting: Cardiology

## 2013-10-28 ENCOUNTER — Encounter: Payer: Self-pay | Admitting: *Deleted

## 2013-10-28 NOTE — Progress Notes (Signed)
NNo-show. This encounter was created in error - please disregard.

## 2013-11-04 ENCOUNTER — Encounter: Payer: Self-pay | Admitting: Cardiovascular Disease

## 2013-11-04 ENCOUNTER — Encounter: Payer: Self-pay | Admitting: Internal Medicine

## 2013-11-04 ENCOUNTER — Ambulatory Visit: Payer: Medicare PPO | Admitting: Cardiovascular Disease

## 2013-11-10 ENCOUNTER — Telehealth: Payer: Self-pay | Admitting: Cardiology

## 2013-11-10 ENCOUNTER — Encounter: Payer: Commercial Managed Care - HMO | Admitting: *Deleted

## 2013-11-10 NOTE — Telephone Encounter (Signed)
LMOVM reminding pt to send remote transmission.   

## 2013-11-14 ENCOUNTER — Encounter: Payer: Self-pay | Admitting: Cardiology

## 2013-12-02 ENCOUNTER — Inpatient Hospital Stay (HOSPITAL_COMMUNITY)
Admission: EM | Admit: 2013-12-02 | Discharge: 2013-12-03 | DRG: 683 | Disposition: A | Discharge status: Nursing Home (Includes ICF) | Payer: Medicare HMO | Attending: Internal Medicine | Admitting: Internal Medicine

## 2013-12-02 ENCOUNTER — Encounter: Payer: Self-pay | Admitting: Gastroenterology

## 2013-12-02 ENCOUNTER — Ambulatory Visit (INDEPENDENT_AMBULATORY_CARE_PROVIDER_SITE_OTHER): Payer: Commercial Managed Care - HMO | Admitting: Gastroenterology

## 2013-12-02 ENCOUNTER — Telehealth: Payer: Self-pay

## 2013-12-02 ENCOUNTER — Inpatient Hospital Stay (HOSPITAL_COMMUNITY): Payer: Medicare HMO

## 2013-12-02 ENCOUNTER — Encounter (HOSPITAL_COMMUNITY): Payer: Self-pay | Admitting: Emergency Medicine

## 2013-12-02 VITALS — BP 126/80 | HR 80 | Temp 96.8°F | Resp 18 | Ht 66.0 in | Wt 162.0 lb

## 2013-12-02 DIAGNOSIS — I5032 Chronic diastolic (congestive) heart failure: Secondary | ICD-10-CM | POA: Diagnosis present

## 2013-12-02 DIAGNOSIS — Z823 Family history of stroke: Secondary | ICD-10-CM

## 2013-12-02 DIAGNOSIS — R1030 Lower abdominal pain, unspecified: Secondary | ICD-10-CM

## 2013-12-02 DIAGNOSIS — Z825 Family history of asthma and other chronic lower respiratory diseases: Secondary | ICD-10-CM

## 2013-12-02 DIAGNOSIS — Z87891 Personal history of nicotine dependence: Secondary | ICD-10-CM

## 2013-12-02 DIAGNOSIS — R109 Unspecified abdominal pain: Secondary | ICD-10-CM | POA: Diagnosis not present

## 2013-12-02 DIAGNOSIS — Z90710 Acquired absence of both cervix and uterus: Secondary | ICD-10-CM | POA: Diagnosis not present

## 2013-12-02 DIAGNOSIS — R110 Nausea: Secondary | ICD-10-CM

## 2013-12-02 DIAGNOSIS — Z9049 Acquired absence of other specified parts of digestive tract: Secondary | ICD-10-CM | POA: Diagnosis present

## 2013-12-02 DIAGNOSIS — I10 Essential (primary) hypertension: Secondary | ICD-10-CM | POA: Diagnosis present

## 2013-12-02 DIAGNOSIS — K589 Irritable bowel syndrome without diarrhea: Secondary | ICD-10-CM | POA: Diagnosis present

## 2013-12-02 DIAGNOSIS — F09 Unspecified mental disorder due to known physiological condition: Secondary | ICD-10-CM | POA: Diagnosis present

## 2013-12-02 DIAGNOSIS — E785 Hyperlipidemia, unspecified: Secondary | ICD-10-CM | POA: Diagnosis present

## 2013-12-02 DIAGNOSIS — R634 Abnormal weight loss: Secondary | ICD-10-CM

## 2013-12-02 DIAGNOSIS — F0390 Unspecified dementia without behavioral disturbance: Secondary | ICD-10-CM | POA: Diagnosis present

## 2013-12-02 DIAGNOSIS — N179 Acute kidney failure, unspecified: Secondary | ICD-10-CM | POA: Diagnosis present

## 2013-12-02 DIAGNOSIS — N178 Other acute kidney failure: Secondary | ICD-10-CM

## 2013-12-02 DIAGNOSIS — E039 Hypothyroidism, unspecified: Secondary | ICD-10-CM | POA: Diagnosis present

## 2013-12-02 DIAGNOSIS — E860 Dehydration: Secondary | ICD-10-CM | POA: Diagnosis present

## 2013-12-02 DIAGNOSIS — K219 Gastro-esophageal reflux disease without esophagitis: Secondary | ICD-10-CM | POA: Diagnosis present

## 2013-12-02 DIAGNOSIS — Z950 Presence of cardiac pacemaker: Secondary | ICD-10-CM

## 2013-12-02 DIAGNOSIS — E875 Hyperkalemia: Secondary | ICD-10-CM | POA: Diagnosis present

## 2013-12-02 DIAGNOSIS — R627 Adult failure to thrive: Secondary | ICD-10-CM

## 2013-12-02 DIAGNOSIS — R4189 Other symptoms and signs involving cognitive functions and awareness: Secondary | ICD-10-CM | POA: Diagnosis present

## 2013-12-02 LAB — CBC WITH DIFFERENTIAL/PLATELET
Basophils Absolute: 0 10*3/uL (ref 0.0–0.1)
Basophils Relative: 0 % (ref 0–1)
EOS PCT: 1 % (ref 0–5)
Eosinophils Absolute: 0.1 10*3/uL (ref 0.0–0.7)
HCT: 50.6 % — ABNORMAL HIGH (ref 36.0–46.0)
Hemoglobin: 17.2 g/dL — ABNORMAL HIGH (ref 12.0–15.0)
LYMPHS ABS: 1.4 10*3/uL (ref 0.7–4.0)
Lymphocytes Relative: 12 % (ref 12–46)
MCH: 31.8 pg (ref 26.0–34.0)
MCHC: 34 g/dL (ref 30.0–36.0)
MCV: 93.5 fL (ref 78.0–100.0)
MONO ABS: 1.5 10*3/uL — AB (ref 0.1–1.0)
Monocytes Relative: 13 % — ABNORMAL HIGH (ref 3–12)
Neutro Abs: 8.4 10*3/uL — ABNORMAL HIGH (ref 1.7–7.7)
Neutrophils Relative %: 74 % (ref 43–77)
Platelets: 228 10*3/uL (ref 150–400)
RBC: 5.41 MIL/uL — AB (ref 3.87–5.11)
RDW: 13.4 % (ref 11.5–15.5)
WBC: 11.4 10*3/uL — ABNORMAL HIGH (ref 4.0–10.5)

## 2013-12-02 LAB — COMPREHENSIVE METABOLIC PANEL
ALT: 45 U/L — ABNORMAL HIGH (ref 0–35)
ALT: 41 U/L — AB (ref 0–35)
AST: 27 U/L (ref 0–37)
AST: 24 U/L (ref 0–37)
Albumin: 4.2 g/dL (ref 3.5–5.2)
Albumin: 3.9 g/dL (ref 3.5–5.2)
Alkaline Phosphatase: 209 U/L — ABNORMAL HIGH (ref 39–117)
Alkaline Phosphatase: 195 U/L — ABNORMAL HIGH (ref 39–117)
Anion gap: 18 — ABNORMAL HIGH (ref 5–15)
BILIRUBIN TOTAL: 0.5 mg/dL (ref 0.3–1.2)
BUN: 66 mg/dL — AB (ref 6–23)
BUN: 68 mg/dL — ABNORMAL HIGH (ref 6–23)
CALCIUM: 10.4 mg/dL (ref 8.4–10.5)
CHLORIDE: 97 meq/L (ref 96–112)
CO2: 21 meq/L (ref 19–32)
CO2: 20 mEq/L (ref 19–32)
CREATININE: 1.75 mg/dL — AB (ref 0.50–1.10)
CREATININE: 1.62 mg/dL — AB (ref 0.50–1.10)
Calcium: 10.7 mg/dL — ABNORMAL HIGH (ref 8.4–10.5)
Chloride: 95 mEq/L — ABNORMAL LOW (ref 96–112)
GFR calc non Af Amer: 30 mL/min — ABNORMAL LOW (ref >90)
GFR, EST AFRICAN AMERICAN: 34 mL/min — AB (ref >90)
GLUCOSE: 92 mg/dL (ref 70–99)
Glucose, Bld: 109 mg/dL — ABNORMAL HIGH (ref 70–99)
Potassium: 6.6 mEq/L (ref 3.5–5.3)
Potassium: 5.8 mEq/L — ABNORMAL HIGH (ref 3.7–5.3)
Sodium: 135 mEq/L (ref 135–145)
Sodium: 135 mEq/L — ABNORMAL LOW (ref 137–147)
Total Bilirubin: 0.6 mg/dL (ref 0.3–1.2)
Total Protein: 8.2 g/dL (ref 6.0–8.3)
Total Protein: 8 g/dL (ref 6.0–8.3)

## 2013-12-02 LAB — LIPASE, BLOOD: LIPASE: 86 U/L — AB (ref 11–59)

## 2013-12-02 LAB — CBC
HCT: 47 % — ABNORMAL HIGH (ref 36.0–46.0)
HEMOGLOBIN: 16 g/dL — AB (ref 12.0–15.0)
MCH: 31.6 pg (ref 26.0–34.0)
MCHC: 34 g/dL (ref 30.0–36.0)
MCV: 92.9 fL (ref 78.0–100.0)
Platelets: 189 10*3/uL (ref 150–400)
RBC: 5.06 MIL/uL (ref 3.87–5.11)
RDW: 13.4 % (ref 11.5–15.5)
WBC: 11.3 10*3/uL — ABNORMAL HIGH (ref 4.0–10.5)

## 2013-12-02 LAB — TSH: TSH: 1.444 u[IU]/mL (ref 0.350–4.500)

## 2013-12-02 MED ORDER — INSULIN ASPART 100 UNIT/ML IV SOLN
6.0000 [IU] | Freq: Once | INTRAVENOUS | Status: AC
Start: 2013-12-02 — End: 2013-12-02
  Administered 2013-12-02: 6 [IU] via INTRAVENOUS

## 2013-12-02 MED ORDER — ATENOLOL 25 MG PO TABS
50.0000 mg | ORAL_TABLET | Freq: Every morning | ORAL | Status: DC
  Administered 2013-12-03: 50 mg via ORAL
  Filled 2013-12-02: qty 2

## 2013-12-02 MED ORDER — SODIUM CHLORIDE 0.9 % IJ SOLN
3.0000 mL | Freq: Two times a day (BID) | INTRAMUSCULAR | Status: DC
  Administered 2013-12-03: 3 mL via INTRAVENOUS

## 2013-12-02 MED ORDER — DEXTROSE 50 % IV SOLN
INTRAVENOUS | Status: AC
  Filled 2013-12-02: qty 50

## 2013-12-02 MED ORDER — DONEPEZIL HCL 5 MG PO TABS
5.0000 mg | ORAL_TABLET | Freq: Every morning | ORAL | Status: DC
  Administered 2013-12-03: 5 mg via ORAL
  Filled 2013-12-02 (×3): qty 1

## 2013-12-02 MED ORDER — ONDANSETRON HCL 4 MG PO TABS: 4.0000 mg | ORAL_TABLET | Freq: Four times a day (QID) | ORAL | Status: DC | PRN

## 2013-12-02 MED ORDER — LEVOTHYROXINE SODIUM 100 MCG PO TABS
100.0000 ug | ORAL_TABLET | Freq: Every day | ORAL | Status: DC
  Administered 2013-12-03: 100 ug via ORAL
  Filled 2013-12-02 (×3): qty 1

## 2013-12-02 MED ORDER — SERTRALINE HCL 50 MG PO TABS
50.0000 mg | ORAL_TABLET | Freq: Every morning | ORAL | Status: DC
  Administered 2013-12-03: 50 mg via ORAL
  Filled 2013-12-02 (×3): qty 1

## 2013-12-02 MED ORDER — ENOXAPARIN SODIUM 30 MG/0.3ML ~~LOC~~ SOLN
30.0000 mg | SUBCUTANEOUS | Status: DC
  Administered 2013-12-02: 30 mg via SUBCUTANEOUS
  Filled 2013-12-02: qty 0.3

## 2013-12-02 MED ORDER — ALBUTEROL SULFATE (2.5 MG/3ML) 0.083% IN NEBU
5.0000 mg | INHALATION_SOLUTION | Freq: Once | RESPIRATORY_TRACT | Status: AC
  Administered 2013-12-02: 5 mg via RESPIRATORY_TRACT
  Filled 2013-12-02: qty 6

## 2013-12-02 MED ORDER — RISPERIDONE 0.25 MG PO TABS: 0.1250 mg | ORAL_TABLET | Freq: Two times a day (BID) | ORAL | Status: DC

## 2013-12-02 MED ORDER — NYSTATIN-TRIAMCINOLONE 100000-0.1 UNIT/GM-% EX CREA
TOPICAL_CREAM | CUTANEOUS | Status: AC
  Filled 2013-12-02: qty 15

## 2013-12-02 MED ORDER — ASPIRIN EC 81 MG PO TBEC
81.0000 mg | DELAYED_RELEASE_TABLET | Freq: Every morning | ORAL | Status: DC
  Administered 2013-12-03: 81 mg via ORAL
  Filled 2013-12-02: qty 1

## 2013-12-02 MED ORDER — DEXTROSE 50 % IV SOLN
1.0000 | Freq: Once | INTRAVENOUS | Status: AC
  Administered 2013-12-02: 50 mL via INTRAVENOUS

## 2013-12-02 MED ORDER — FENTANYL CITRATE 0.05 MG/ML IJ SOLN
50.0000 ug | Freq: Once | INTRAMUSCULAR | Status: AC
  Administered 2013-12-02: 50 ug via INTRAVENOUS
  Filled 2013-12-02: qty 2

## 2013-12-02 MED ORDER — ACETAMINOPHEN 500 MG PO TABS
1000.0000 mg | ORAL_TABLET | Freq: Four times a day (QID) | ORAL | Status: DC | PRN
  Administered 2013-12-03: 1000 mg via ORAL
  Filled 2013-12-02 (×2): qty 2

## 2013-12-02 MED ORDER — NYSTATIN-TRIAMCINOLONE 100000-0.1 UNIT/GM-% EX CREA: 1.0000 "application " | TOPICAL_CREAM | Freq: Two times a day (BID) | CUTANEOUS | Status: DC

## 2013-12-02 MED ORDER — SODIUM CHLORIDE 0.9 % IV BOLUS (SEPSIS)
500.0000 mL | Freq: Once | INTRAVENOUS | Status: AC
  Administered 2013-12-02: 500 mL via INTRAVENOUS

## 2013-12-02 MED ORDER — SODIUM CHLORIDE 0.9 % IV SOLN
INTRAVENOUS | Status: DC
  Administered 2013-12-02: 23:00:00 via INTRAVENOUS

## 2013-12-02 MED ORDER — ONDANSETRON HCL 4 MG/2ML IJ SOLN: 4.0000 mg | Freq: Four times a day (QID) | INTRAMUSCULAR | Status: DC | PRN

## 2013-12-02 MED ORDER — NYSTATIN-TRIAMCINOLONE 100000-0.1 UNIT/GM-% EX CREA
1.0000 "application " | TOPICAL_CREAM | Freq: Two times a day (BID) | CUTANEOUS | Status: DC
  Administered 2013-12-02 – 2013-12-03 (×2): 1 via TOPICAL
  Filled 2013-12-02: qty 15

## 2013-12-02 MED ORDER — RISPERIDONE 0.25 MG PO TABS
0.1250 mg | ORAL_TABLET | Freq: Every morning | ORAL | Status: DC
  Filled 2013-12-02 (×5): qty 0.5

## 2013-12-02 MED ORDER — RISPERIDONE 0.5 MG PO TABS
ORAL_TABLET | ORAL | Status: AC
  Filled 2013-12-02: qty 1

## 2013-12-02 MED ORDER — PANTOPRAZOLE SODIUM 40 MG PO TBEC
40.0000 mg | DELAYED_RELEASE_TABLET | Freq: Every morning | ORAL | Status: DC
  Administered 2013-12-03: 40 mg via ORAL
  Filled 2013-12-02: qty 1

## 2013-12-02 MED ORDER — RISPERIDONE 0.5 MG PO TABS
0.2500 mg | ORAL_TABLET | Freq: Every day | ORAL | Status: DC
Start: 2013-12-02 — End: 2013-12-03
  Administered 2013-12-02: 0.25 mg via ORAL
  Filled 2013-12-02 (×2): qty 1

## 2013-12-02 NOTE — Patient Instructions (Signed)
1. Please have labs and urine specimen done today. 2. Start Zofran for nausea.  3. I will review any CTs that were done at [facility] and we will decide if CT needed now. 4. If she continues to have uncontrollable pain, decreased urination, weakness, she may need to be evaluated for admission. If her labs come back "off" today, she may require admission.

## 2013-12-02 NOTE — H&P (Signed)
[facility] History and Physical  Kari Hall JAS:[id number] DOB: [birth year] DOA: 12/02/2013  Referring physician: ER PCP: Glo Herring., MD   Chief Complaint: Abdominal pain, poor by mouth intake.  HPI: Kari Hall is a 79 y.o. female  This is a 79 year old lady who has been seeing gastroenterology for the last few months. She initially presented approximately 2-3 months ago with abdominal pain, nausea and poor by mouth intake. She underwent EGD and colonoscopy, both of which did not reveal major problems except for diverticular disease and tubular adenomas which were removed and were benign. She also was found to have delusional thoughts and required commitment at [facility] for evaluation. She was started on multiple medications at that time it is felt that she may have dementia. She has had poor intake for the last couple weeks. She was seen at the office of gastroenterology this morning and lab work was abnormal and she is now being admitted for further management.      Past Medical History  Diagnosis Date  . GERD (gastroesophageal reflux disease)   . Dyslipidemia   . Essential hypertension, benign   . Hypothyroidism   . IBS (irritable bowel syndrome)   . Diverticulitis   . Sinoatrial node dysfunction   . Chronic diastolic heart failure   . Pacemaker   . Anxiety   . Back pain   . Kyphosis    Past Surgical History  Procedure Laterality Date  . Cholecystectomy    . Appendectomy    . Colectomy      secondary to diverticulitis  . Ectopic pregnancy surgery    . Pacemaker placement    . Abdominal hysterectomy    . Colonoscopy  2003    Dr. Arnoldo Morale: few scattered diverticula in transverse and descending colon  . ?thyroid surgery?    . Colonoscopy N/A 10/17/2013    Dr. Gala Romney: Noncritical Schatzki ring, not manipulated, 3 cm hiatal hernia  . Esophagogastroduodenoscopy N/A 10/17/2013    Dr. Gala Romney: Pancolonic diverticulosis, splenic flexure and ileocecal valve polyps  removed, tubular adenomas. Next TCS 5 years if health permits   Social History:  reports that she quit smoking about 34 years ago. Her smoking use included Cigarettes. She has a 22.5 pack-year smoking history. She has never used smokeless tobacco. She reports that she does not drink alcohol or use illicit drugs.  No Known Allergies  Family History  Problem Relation Age of Onset  . Stroke Mother   . Emphysema Father   . Colon cancer Neg Hx      Prior to Admission medications   Medication Sig Start Date End Date Taking? Authorizing Provider  acetaminophen (TYLENOL) 500 MG tablet Take 1,000 mg by mouth every 6 (six) hours as needed for moderate pain.   Yes Historical Provider, MD  aspirin EC 81 MG tablet Take 81 mg by mouth every morning.    Yes Historical Provider, MD  atenolol (TENORMIN) 50 MG tablet Take 50 mg by mouth every morning.    Yes Historical Provider, MD  donepezil (ARICEPT) 5 MG tablet Take 5 mg by mouth every morning.    Yes Historical Provider, MD  levothyroxine (SYNTHROID, LEVOTHROID) 100 MCG tablet Take 100 mcg by mouth daily before breakfast.   Yes Historical Provider, MD  pantoprazole (PROTONIX) 40 MG tablet Take 40 mg by mouth every morning.    Yes Historical Provider, MD  potassium chloride SA (K-DUR,KLOR-CON) 20 MEQ tablet Take 60 mEq by mouth every morning.    Yes  Historical Provider, MD  risperiDONE (RISPERDAL) 0.25 MG tablet Take 0.125-0.25 mg by mouth 2 (two) times daily. One-half tablet in the morning and one tablet at bedtime   Yes Historical Provider, MD  sertraline (ZOLOFT) 50 MG tablet Take 50 mg by mouth every morning.    Yes Historical Provider, MD  nystatin-triamcinolone (MYCOLOG II) cream Apply 1 application topically 2 (two) times daily. To periumbilical rash. Continue for 1 week after rash gone. 12/02/13   Mahala Menghini, PA-C  ondansetron (ZOFRAN) 4 MG tablet Take 1 tablet (4 mg total) by mouth every 6 (six) hours as needed for nausea or vomiting. 12/02/13    Mahala Menghini, PA-C   Physical Exam: Filed Vitals:   12/02/13 1704 12/02/13 1705 12/02/13 1842 12/02/13 1859  BP: 121/65     Pulse: 80     Temp: 97.8 F (36.6 C)     TempSrc: Oral     Resp: 18     Height:  5\' 7"  (1.702 m)    Weight:  72.576 kg (160 lb)    SpO2: 94%  95% 97%    Wt Readings from Last 3 Encounters:  12/02/13 72.576 kg (160 lb)  12/02/13 73.483 kg (162 lb)  10/21/13 81.647 kg (180 lb)    General:  Appears anxious. Clinically dehydrated. Appears to be somewhat disorientated with delusional/paranoid thoughts Eyes: PERRL, normal lids, irises & conjunctiva ENT: grossly normal hearing, lips & tongue Neck: no LAD, masses or thyromegaly Cardiovascular: RRR, no m/r/g. No LE edema. Telemetry: SR, no arrhythmias  Respiratory: CTA bilaterally, no w/r/r. Normal respiratory effort. Abdomen: soft, ntnd Skin: no rash or induration seen on limited exam Musculoskeletal: grossly normal tone BUE/BLE Psychiatric: Anxiety with delusional/paranoid thinking. Neurologic: grossly non-focal.          Labs on Admission:  Basic Metabolic Panel:  Recent Labs Lab 12/02/13 1218 12/02/13 1754  NA 135 135*  K 6.6* 5.8*  CL 95* 97  CO2 21 20  GLUCOSE 109* 92  BUN 66* 68*  CREATININE 1.75* 1.62*  CALCIUM 10.7* 10.4   Liver Function Tests:  Recent Labs Lab 12/02/13 1218 12/02/13 1754  AST 27 24  ALT 45* 41*  ALKPHOS 209* 195*  BILITOT 0.5 0.6  PROT 8.2 8.0  ALBUMIN 4.2 3.9    Recent Labs Lab 12/02/13 1754  LIPASE 86*   No results found for this basename: AMMONIA,  in the last 168 hours CBC:  Recent Labs Lab 12/02/13 1218  WBC 11.4*  NEUTROABS 8.4*  HGB 17.2*  HCT 50.6*  MCV 93.5  PLT 228   Cardiac Enzymes: No results found for this basename: CKTOTAL, CKMB, CKMBINDEX, TROPONINI,  in the last 168 hours  BNP (last 3 results)  Recent Labs  09/07/13 0850 09/08/13 0506 09/08/13 1935  PROBNP 180.7 226.7 278.3   CBG: No results found for this  basename: GLUCAP,  in the last 168 hours  Radiological Exams on Admission: No results found.    Assessment/Plan   1. Acute renal failure with hyperkalemia. Hyperkalemia is being treated with intravenous dextrose and insulin. 2. Hyperkalemia secondary to #1. 3. And dehydration causing 1. 4. Cardiac pacemaker in situ. 5. Hypertension. 6. History of chronic diastolic congestive heart failure. Clinically compensated. 7. Cognitive decline, possibly dementia. I think this may responsible for her presentation today with poor by mouth intake. 8. Abdominal pain, unclear etiology.  Plan: 1. Admit to telemetry floor. 2. Intravenous fluids. 3. CT scan of the abdomen without contrast. 4. Gastroenterology consultation.  Further recommendations will depend on patient's hospital progress.   Code Status: Full code.  DVT Prophylaxis: Lovenox.  Family Communication: I discussed the plan with the patient's family members at the bedside.   Disposition Plan: Depending on progress.   Time spent: 60 minutes.  Doree Albee [facility] Pager [phone].

## 2013-12-02 NOTE — Telephone Encounter (Signed)
Noted. Patient has been admitted.

## 2013-12-02 NOTE — Progress Notes (Signed)
Quick Note:  Patient has been admitted. ______

## 2013-12-02 NOTE — Telephone Encounter (Signed)
I called and pt's phone number was not working. I called her Emergency contact , her daughter, Kari Hall and she is at work and said she will call her brother to take her mom to the ED now.

## 2013-12-02 NOTE — ED Notes (Signed)
Ems called out to [facility] family care, states pt has low potassium

## 2013-12-02 NOTE — Progress Notes (Addendum)
Primary Care Physician: Glo Herring., MD  Primary Gastroenterologist:  Garfield Cornea, MD   Chief Complaint  Patient presents with  . Abdominal Pain  . Nausea    HPI: Kari Hall is a 79 y.o. female here for followup. We initially saw the patient back in August when she was referred by her cardiologist for further evaluation of abdominal pain and nausea. She presented by her self at the time. At that point she complained of melena, nausea but denied abdominal pain to me. It was concern about memory issues and I had a long discussion with her son at the time, Makaelah Cranfield. Since that visit she had negative I. FOBT. An EGD that showed a noncritical Schatzki ring, hiatal hernia but otherwise unremarkable. A colonoscopy that showed pancolonic diverticulosis a couple of tubular adenomas which were removed. CBC was normal at that time. Back in September she was seen in the emergency department and was found to be delusional and required commitment at [facility] for evaluation. According to her son and daughter who are present today, she was started on multiple medications, Risperdal and Zoloft at that time. In addition she is on Aricept now.  History provided by patient and [name] (daughter) and Elta Guadeloupe (son). Patient currently resides in one of the nursing homes that the family operates. Patient has been consuming minimal food/liquid. Complains of severe nausea. She's had some dry heaves. 30 pound weight loss since she was last here. She's been very anxious. She complains of severe back pain. Previously on Tylenol and Dr. Gerarda Fraction provided a cream. She is not on any narcotics. She has not had any sort of imaging of her back. She reports her bowel movements are okay. Denies blood in the stool or melena. Denies any headache. Denies heartburn, dysphagia. Feels weak and lightheaded.  Current Outpatient Prescriptions  Medication Sig Dispense Refill  . acetaminophen (TYLENOL) 500 MG tablet Take 1,000 mg by  mouth every 6 (six) hours as needed for moderate pain.      Marland Kitchen aspirin EC 81 MG tablet Take 81 mg by mouth daily.      Marland Kitchen atenolol (TENORMIN) 50 MG tablet Take 50 mg by mouth daily.       Marland Kitchen donepezil (ARICEPT) 5 MG tablet Take 5 mg by mouth at bedtime.      Marland Kitchen levothyroxine (SYNTHROID, LEVOTHROID) 100 MCG tablet Take 100 mcg by mouth daily before breakfast.      . pantoprazole (PROTONIX) 40 MG tablet Take 40 mg by mouth daily.      . potassium chloride SA (K-DUR,KLOR-CON) 20 MEQ tablet Take 60 mEq by mouth daily.       . risperiDONE (RISPERDAL) 0.25 MG tablet Take 0.25 mg by mouth at bedtime.      . risperiDONE (RISPERDAL) 0.5 MG tablet Take 0.5 mg by mouth at bedtime.      . risperiDONE (RISPERDAL) 1 MG tablet Take 1 mg by mouth at bedtime.      . sertraline (ZOLOFT) 50 MG tablet Take 50 mg by mouth daily.      . [DISCONTINUED] ezetimibe-simvastatin (VYTORIN) 10-40 MG per tablet Take 1 tablet by mouth at bedtime.         No current facility-administered medications for this visit.    Allergies as of 12/02/2013  . (No Known Allergies)   Past Medical History  Diagnosis Date  . GERD (gastroesophageal reflux disease)   . Dyslipidemia   . Essential hypertension, benign   .  Hypothyroidism   . IBS (irritable bowel syndrome)   . Diverticulitis   . Sinoatrial node dysfunction   . Chronic diastolic heart failure   . Pacemaker   . Anxiety    Past Surgical History  Procedure Laterality Date  . Cholecystectomy    . Appendectomy    . Colectomy      secondary to diverticulitis  . Ectopic pregnancy surgery    . Pacemaker placement    . Abdominal hysterectomy    . Colonoscopy  2003    Dr. Arnoldo Morale: few scattered diverticula in transverse and descending colon  . ?thyroid surgery?    . Colonoscopy N/A 10/17/2013    Dr. Gala Romney: Noncritical Schatzki ring, not manipulated, 3 cm hiatal hernia  . Esophagogastroduodenoscopy N/A 10/17/2013    Dr. Gala Romney: Pancolonic diverticulosis, splenic flexure and  ileocecal valve polyps removed, tubular adenomas. Next TCS 5 years if health permits    ROS:  General: No fever. See history of present illness. ENT: Negative for hoarseness, difficulty swallowing , nasal congestion. CV: Negative for chest pain, angina, palpitations, dyspnea on exertion, peripheral edema.  Respiratory: Negative for dyspnea at rest, dyspnea on exertion, cough, sputum, wheezing.  GI: See history of present illness. GU:  Negative for  hematuria, urinary incontinence, urinary frequency, nocturnal urination. Positive dysuria. Endo: See history of present illness.  Physical Examination:   BP 126/80  Pulse 80  Temp(Src) 96.8 F (36 C) (Oral)  Resp 18  Ht 5\' 6"  (1.676 m)  Wt 162 lb (73.483 kg)  BMI 26.16 kg/m2  General: Anxious appearing and only pale female in no acute distress.  Eyes: No icterus. Conjunctive are pale. Mouth: Oropharyngeal mucosa moist and pink , no lesions erythema or exudate. Lungs: Clear to auscultation bilaterally.  Heart: Regular rate and rhythm, no murmurs rubs or gallops.  Abdomen: Bowel sounds are normal, mild lower abdominal tenderness, nondistended, no hepatosplenomegaly or masses, no abdominal bruits or hernia , no rebound or guarding.  Erythematous rash, around the umbilicus. Extremities: No lower extremity edema. No clubbing or deformities. Neuro: Alert and oriented x 4   Skin: Warm and dry, no jaundice.   Psych: Alert and cooperative, anxious  Labs:  Lab Results  Component Value Date   CREATININE 1.07 10/21/2013   BUN 8 10/21/2013   NA 145 10/21/2013   K 4.1 10/21/2013   CL 108 10/21/2013   CO2 27 10/21/2013   Lab Results  Component Value Date   WBC 5.0 10/21/2013   HGB 13.7 10/21/2013   HCT 40.2 10/21/2013   MCV 92.6 10/21/2013   PLT 140* 10/21/2013    Imaging Studies: No results found.   Impression/Plan:   Related Problem: Abnormal weight loss    79 year old lady who presents with her son and daughter for further evaluation of  anorexia, nausea, 30 pound weight loss since August. Initially seen back in then melena, nausea, questionable history of abdominal pain. EGD and colonoscopy findings as previously outlined. Patient currently in assisted living home. Minimal oral intake. Complains of significant back pain which may be contributing to her nausea and lack of appetite. Requested family to discuss her back issues with her PCP urgently. May need diagnostic evaluation and pain medication. On exam, patient weak, anxious. History may be somewhat unreliable given her psychiatric/mental disease. Will obtain stat labs, urinalysis with culture reflex. Would like to get a CT of the abdomen and pelvis for weight loss, abdominal pain, anorexia, nausea. Patient may of had one in [facility] recently and  we have requested these records first. Son was not sure whether or not it was a CT versus a plain abdominal film. If there is any significant findings on stat labs, she may need to be admitted today. Discussed with family that if she continues to have minimal oral intake, diminished urinary output, uncontrollable pain she should go to Emergency department for consideration of admission.  Also started Mycolog II cream for candidiasis cutaneous.  zofran for nausea.

## 2013-12-02 NOTE — Assessment & Plan Note (Addendum)
79 year old lady who presents with her son and daughter for further evaluation of anorexia, nausea, 30 pound weight loss since August. Initially seen back in then melena, nausea, questionable history of abdominal pain. EGD and colonoscopy findings as previously outlined. Patient currently in assisted living home. Minimal oral intake. Complains of significant back pain which may be contributing to her nausea and lack of appetite. Requested family to discuss her back issues with her PCP urgently. May need diagnostic evaluation and pain medication. On exam, patient weak, anxious. History may be somewhat unreliable given her psychiatric/mental disease. Will obtain stat labs, urinalysis with culture reflex. Would like to get a CT of the abdomen and pelvis for weight loss, abdominal pain, anorexia, nausea. Patient may of had one in [facility] recently and we have requested these records first. Pandora Leiter was not sure whether or not it was a CT versus a plain abdominal film. If there is any significant findings on stat labs, she may need to be admitted today. Discussed with family that if she continues to have minimal oral intake, diminished urinary output, uncontrollable pain she should go to Emergency department for consideration of admission.  Also started Mycolog II cream for candidiasis cutaneous. zofran for nausea.

## 2013-12-02 NOTE — ED Notes (Signed)
Pt states her potassium is low but unsure of numbers.

## 2013-12-02 NOTE — Progress Notes (Signed)
cc'ed to pcp °

## 2013-12-02 NOTE — Telephone Encounter (Signed)
Needs to go to the ED due to hyperkalemia, acute renal injury. Can be triaged and treated appropriately there.

## 2013-12-02 NOTE — ED Notes (Addendum)
Pt has bruising to the right lower leg and stage II breakdown to the buttocks.

## 2013-12-02 NOTE — ED Provider Notes (Signed)
CSN: [id number]     Arrival date & time 12/02/13  1707 History   First MD Initiated Contact with Patient 12/02/13 1711     Chief Complaint  Patient presents with  . Abnormal Lab     (Consider location/radiation/quality/duration/timing/severity/associated sxs/prior Treatment) The history is provided by the patient and medical records.   patient was reportedly sent in for hypokalemia per nursing notes. Patient has had nausea and vomiting and some rectal pain for a while now. Was seen PCP and shows renal insufficiency with hyperkalemia. No fevers. States she's had a few falls. No chest pain. No trouble breathing. Vomiting is the stomach contents. Is also reportedly lost 30 pounds over the last few months.  Past Medical History  Diagnosis Date  . GERD (gastroesophageal reflux disease)   . Dyslipidemia   . Essential hypertension, benign   . Hypothyroidism   . IBS (irritable bowel syndrome)   . Diverticulitis   . Sinoatrial node dysfunction   . Chronic diastolic heart failure   . Pacemaker   . Anxiety   . Back pain   . Kyphosis    Past Surgical History  Procedure Laterality Date  . Cholecystectomy    . Appendectomy    . Colectomy      secondary to diverticulitis  . Ectopic pregnancy surgery    . Pacemaker placement    . Abdominal hysterectomy    . Colonoscopy  2003    Dr. Arnoldo Morale: few scattered diverticula in transverse and descending colon  . ?thyroid surgery?    . Colonoscopy N/A 10/17/2013    Dr. Gala Romney: Noncritical Schatzki ring, not manipulated, 3 cm hiatal hernia  . Esophagogastroduodenoscopy N/A 10/17/2013    Dr. Gala Romney: Pancolonic diverticulosis, splenic flexure and ileocecal valve polyps removed, tubular adenomas. Next TCS 5 years if health permits   Family History  Problem Relation Age of Onset  . Stroke Mother   . Emphysema Father   . Colon cancer Neg Hx    History  Substance Use Topics  . Smoking status: Former Smoker -- 0.50 packs/day for 45 years    Types:  Cigarettes    Quit date: 02/24/1979  . Smokeless tobacco: Never Used  . Alcohol Use: No   OB History   Grav Para Term Preterm Abortions TAB SAB Ect Mult Living   3 3 3       3      Review of Systems  Constitutional: Positive for appetite change. Negative for fever.  HENT: Negative for trouble swallowing.   Respiratory: Negative for shortness of breath.   Gastrointestinal: Positive for nausea, vomiting, abdominal pain and rectal pain.  Genitourinary: Negative for dysuria.  Musculoskeletal: Negative for back pain.  Skin: Negative for wound.  Neurological: Negative for seizures.      Allergies  Review of patient's allergies indicates no known allergies.  Home Medications   Prior to Admission medications   Medication Sig Start Date End Date Taking? Authorizing Provider  acetaminophen (TYLENOL) 500 MG tablet Take 1,000 mg by mouth every 6 (six) hours as needed for moderate pain.   Yes Historical Provider, MD  aspirin EC 81 MG tablet Take 81 mg by mouth every morning.    Yes Historical Provider, MD  atenolol (TENORMIN) 50 MG tablet Take 50 mg by mouth every morning.    Yes Historical Provider, MD  donepezil (ARICEPT) 5 MG tablet Take 5 mg by mouth every morning.    Yes Historical Provider, MD  levothyroxine (SYNTHROID, LEVOTHROID) 100 MCG tablet Take 100  mcg by mouth daily before breakfast.   Yes Historical Provider, MD  pantoprazole (PROTONIX) 40 MG tablet Take 40 mg by mouth every morning.    Yes Historical Provider, MD  potassium chloride SA (K-DUR,KLOR-CON) 20 MEQ tablet Take 60 mEq by mouth every morning.    Yes Historical Provider, MD  risperiDONE (RISPERDAL) 0.25 MG tablet Take 0.125-0.25 mg by mouth 2 (two) times daily. One-half tablet in the morning and one tablet at bedtime   Yes Historical Provider, MD  sertraline (ZOLOFT) 50 MG tablet Take 50 mg by mouth every morning.    Yes Historical Provider, MD  nystatin-triamcinolone (MYCOLOG II) cream Apply 1 application topically 2  (two) times daily. To periumbilical rash. Continue for 1 week after rash gone. 12/02/13   Mahala Menghini, PA-C  ondansetron (ZOFRAN) 4 MG tablet Take 1 tablet (4 mg total) by mouth every 6 (six) hours as needed for nausea or vomiting. 12/02/13   Mahala Menghini, PA-C   BP 121/65  Pulse 80  Temp(Src) 97.8 F (36.6 C) (Oral)  Resp 18  Ht 5\' 7"  (1.702 m)  Wt 160 lb (72.576 kg)  BMI 25.05 kg/m2  SpO2 97% Physical Exam  Constitutional: She appears well-developed.  HENT:  Head: Normocephalic and atraumatic.  Neck: Neck supple.  Cardiovascular: Normal rate and regular rhythm.   Pulmonary/Chest: Effort normal.  Abdominal:  Mild diffuse tenderness. No distention. No hernias palpated  Musculoskeletal:  Tenderness over lower sacrum/coccyx. No step-off or deformity  Neurological: She is alert.  Skin: Skin is warm.    ED Course  Procedures (including critical care time) Labs Review Labs Reviewed  COMPREHENSIVE METABOLIC PANEL - Abnormal; Notable for the following:    Sodium 135 (*)    Potassium 5.8 (*)    BUN 68 (*)    Creatinine, Ser 1.62 (*)    ALT 41 (*)    Alkaline Phosphatase 195 (*)    GFR calc non Af Amer 30 (*)    GFR calc Af Amer 34 (*)    Anion gap 18 (*)    All other components within normal limits  LIPASE, BLOOD - Abnormal; Notable for the following:    Lipase 86 (*)    All other components within normal limits  URINALYSIS, ROUTINE W REFLEX MICROSCOPIC    Imaging Review No results found.   EKG Interpretation   Date/Time:  Tuesday December 02 2013 17:30:04 EDT Ventricular Rate:  83 PR Interval:  183 QRS Duration: 71 QT Interval:  371 QTC Calculation: 436 R Axis:   -24 Text Interpretation:  Sinus or ectopic atrial rhythm Borderline left axis  deviation Probable anteroseptal infarct, old Baseline wander in lead(s) I  III aVL T waves more peaked than previous Confirmed by Trystian Crisanto  MD,  Ovid Curd [phone]) on 12/02/2013 6:00:22 PM      MDM   Final diagnoses:   AKI (acute kidney injury)  Hyperkalemia    Patient    sent in with nausea vomiting and renal failure with hyperkalemia. He has mild  peaked T waves. labs rechecked and were accurate. Blood pressure reassuring. Has been given IV fluids insulin glucose and albuterol. Will admit to internal medicine.  CRITICAL CARE Performed by: Mackie Pai Total critical care time: 30 Critical care time was exclusive of separately billable procedures and treating other patients. Critical care was necessary to treat or prevent imminent or life-threatening deterioration. Critical care was time spent personally by me on the following activities: development of treatment plan with patient  and/or surrogate as well as nursing, discussions with consultants, evaluation of patient's response to treatment, examination of patient, obtaining history from patient or surrogate, ordering and performing treatments and interventions, ordering and review of laboratory studies, ordering and review of radiographic studies, pulse oximetry and re-evaluation of patient's condition.   Jasper Riling. Alvino Chapel, MD 12/02/13 [id number]

## 2013-12-02 NOTE — Telephone Encounter (Signed)
Joaquim Lai called from [facility] with a critical potassium of 6.6. I will send this message to Laban Emperor, NP in The absence of Neil Crouch, [place]. Also, verbally informing Vicente Males.

## 2013-12-03 DIAGNOSIS — R634 Abnormal weight loss: Secondary | ICD-10-CM

## 2013-12-03 DIAGNOSIS — E875 Hyperkalemia: Secondary | ICD-10-CM

## 2013-12-03 DIAGNOSIS — R1030 Lower abdominal pain, unspecified: Secondary | ICD-10-CM

## 2013-12-03 DIAGNOSIS — N178 Other acute kidney failure: Secondary | ICD-10-CM

## 2013-12-03 DIAGNOSIS — R110 Nausea: Secondary | ICD-10-CM

## 2013-12-03 LAB — URINALYSIS W MICROSCOPIC + REFLEX CULTURE
Crystals: NONE SEEN
Glucose, UA: NEGATIVE mg/dL
Hgb urine dipstick: NEGATIVE
NITRITE: NEGATIVE
PH: 5 (ref 5.0–8.0)
Protein, ur: NEGATIVE mg/dL
Specific Gravity, Urine: 1.024 (ref 1.005–1.030)
UROBILINOGEN UA: 0.2 mg/dL (ref 0.0–1.0)

## 2013-12-03 LAB — COMPREHENSIVE METABOLIC PANEL
ALT: 30 U/L (ref 0–35)
ANION GAP: 15 (ref 5–15)
AST: 18 U/L (ref 0–37)
Albumin: 3.1 g/dL — ABNORMAL LOW (ref 3.5–5.2)
Alkaline Phosphatase: 152 U/L — ABNORMAL HIGH (ref 39–117)
BILIRUBIN TOTAL: 0.5 mg/dL (ref 0.3–1.2)
BUN: 55 mg/dL — AB (ref 6–23)
CHLORIDE: 101 meq/L (ref 96–112)
CO2: 19 meq/L (ref 19–32)
CREATININE: 1.32 mg/dL — AB (ref 0.50–1.10)
Calcium: 9.3 mg/dL (ref 8.4–10.5)
GFR, EST AFRICAN AMERICAN: 44 mL/min — AB (ref >90)
GFR, EST NON AFRICAN AMERICAN: 38 mL/min — AB (ref >90)
Glucose, Bld: 85 mg/dL (ref 70–99)
Potassium: 4.9 mEq/L (ref 3.7–5.3)
Sodium: 135 mEq/L — ABNORMAL LOW (ref 137–147)
Total Protein: 6.4 g/dL (ref 6.0–8.3)

## 2013-12-03 LAB — CBC
HCT: 39.9 % (ref 36.0–46.0)
Hemoglobin: 13.5 g/dL (ref 12.0–15.0)
MCH: 31.3 pg (ref 26.0–34.0)
MCHC: 33.8 g/dL (ref 30.0–36.0)
MCV: 92.6 fL (ref 78.0–100.0)
Platelets: 161 10*3/uL (ref 150–400)
RBC: 4.31 MIL/uL (ref 3.87–5.11)
RDW: 13.3 % (ref 11.5–15.5)
WBC: 6.8 10*3/uL (ref 4.0–10.5)

## 2013-12-03 MED ORDER — PANTOPRAZOLE SODIUM 40 MG PO TBEC
40.0000 mg | DELAYED_RELEASE_TABLET | Freq: Every day | ORAL | Status: DC
Start: 2013-12-03 — End: 2013-12-03
  Administered 2013-12-03: 40 mg via ORAL
  Filled 2013-12-03: qty 1

## 2013-12-03 MED ORDER — ENOXAPARIN SODIUM 40 MG/0.4ML ~~LOC~~ SOLN: 40.0000 mg | SUBCUTANEOUS | Status: DC

## 2013-12-03 NOTE — Discharge Instructions (Signed)
°  Colonoscopy Discharge Instructions  Read the instructions outlined below and refer to this sheet in the next few weeks. These discharge instructions provide you with general information on caring for yourself after you leave the hospital. Your doctor may also give you specific instructions. While your treatment has been planned according to the most current medical practices available, unavoidable complications occasionally occur. If you have any problems or questions after discharge, call Dr. Gala Romney at [phone]. ACTIVITY  You may resume your regular activity, but move at a slower pace for the next 24 hours.   Take frequent rest periods for the next 24 hours.   Walking will help get rid of the air and reduce the bloated feeling in your belly (abdomen).   No driving for 24 hours (because of the medicine (anesthesia) used during the test).    Do not sign any important legal documents or operate any machinery for 24 hours (because of the anesthesia used during the test).  NUTRITION  Drink plenty of fluids.   You may resume your normal diet as instructed by your doctor.   Begin with a light meal and progress to your normal diet. Heavy or fried foods are harder to digest and may make you feel sick to your stomach (nauseated).   Avoid alcoholic beverages for 24 hours or as instructed.  MEDICATIONS  You may resume your normal medications unless your doctor tells you otherwise.  WHAT YOU CAN EXPECT TODAY  Some feelings of bloating in the abdomen.   Passage of more gas than usual.   Spotting of blood in your stool or on the toilet paper.  IF YOU HAD POLYPS REMOVED DURING THE COLONOSCOPY:  No aspirin products for 7 days or as instructed.   No alcohol for 7 days or as instructed.   Eat a soft diet for the next 24 hours.  FINDING OUT THE RESULTS OF YOUR TEST Not all test results are available during your visit. If your test results are not back during the visit, make an appointment  with your caregiver to find out the results. Do not assume everything is normal if you have not heard from your caregiver or the medical facility. It is important for you to follow up on all of your test results.  SEEK IMMEDIATE MEDICAL ATTENTION IF:  You have more than a spotting of blood in your stool.   Your belly is swollen (abdominal distention).   You are nauseated or vomiting.   You have a temperature over 101.   You have abdominal pain or discomfort that is severe or gets worse throughout the day.   Polyp, diverticulosis and GERD information provided  Begin Benefiber 2 teaspoons twice daily  Followup on pathology.  Office visit with me in 6-8 weeks for hemorrhoid banding  Pulmonary nodule on recent abdominal CT; see Dr. Manuella Ghazi in one year for a repeat CT (chest)

## 2013-12-03 NOTE — Progress Notes (Signed)
UR chart review completed.  
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
## 2013-12-03 NOTE — Evaluation (Signed)
Physical Therapy Evaluation Patient Details Name: Kari Hall MRN: [id number] DOB: 1934/07/23 Today's Date: 12/03/2013   History of Present Illness  This is a 79 year old lady who has been seeing gastroenterology for the last few months. She initially presented approximately 2-3 months ago with abdominal pain, nausea and poor by mouth intake. She underwent EGD and colonoscopy, both of which did not reveal major problems except for diverticular disease and tubular adenomas which were removed and were benign. She also was found to have delusional thoughts and required commitment at [place] for evaluation. She was started on multiple medications at that time it is felt that she may have dementia. She has had poor intake for the last couple weeks. She was seen at the office of gastroenterology this morning and lab work was abnormal and she is now being admitted for further management.  Clinical Impression  Pt is a 79 year old female who presents to PT for assessment of functional mobility skills.  Pt reports increasing weakness and 3 falls in the past month.  During evaluation, pt requiring min/mod assist for bed mobility skills, min assist and use of RW for transfers, and min guard and use of RW for gait of 10 feet.  Gait distance limited secondary to fatigue.  Pt required encouragement throughout evaluation secondary to fear and anxiety about falling; pt reports 3 falls in the past month.  Recommend continued PT while in the hospital to address strengthening, balance, and activity tolerance for improved functional mobility skills.  Pt will require supervision for all OOB activities, with placement at ALF with HHPT or SNF for rehab after discharge.  Recommend use of RW for gait as pt has a hx of unsteadiness in standing.     Follow Up Recommendations Supervision for mobility/OOB (ALF with HHPT vs. SNF placement; pt will require supervision/min guard for all OOB activities secondary to balance)     Equipment Recommendations  Rolling walker with 5" wheels       Precautions / Restrictions Precautions Precautions: Fall Restrictions Weight Bearing Restrictions: No      Mobility  Bed Mobility Overal bed mobility: Needs Assistance Bed Mobility: Supine to Sit;Sit to Supine     Supine to sit: Mod assist (for trunk) Sit to supine: Min assist (for LE)      Transfers Overall transfer level: Needs assistance Equipment used: Rolling walker (2 wheeled) Transfers: Sit to/from Stand Sit to Stand: Min assist         General transfer comment: Pt required encouragement when at EOB at attempt to stand with pt stating "i'm going to fall"  Ambulation/Gait Ambulation/Gait assistance: Min guard Ambulation Distance (Feet): 10 Feet Assistive device: Rolling walker (2 wheeled) Gait Pattern/deviations: Step-through pattern;Decreased stride length   Gait velocity interpretation: Below normal speed for age/gender       Balance Overall balance assessment: History of Falls;Needs assistance (Pt reports 3 falls in the past month.) Sitting-balance support: Feet supported;No upper extremity supported Sitting balance-Leahy Scale: Good     Standing balance support: Bilateral upper extremity supported;During functional activity Standing balance-Leahy Scale: Fair                               Pertinent Vitals/Pain Pain Assessment: 0-10 Pain Score: 8  Pain Location: Back Pain Intervention(s): Limited activity within patient's tolerance;Premedicated before session;Repositioned    Home Living Family/patient expects to be discharged to:: Unsure  Additional Comments: Pt reports she lived at an ALF.      Prior Function Level of Independence: Independent with assistive device(s)         Comments: Pt reports she was mod (I) with bed mobility skills, transfers, and facility ambulation with use of std cane in the Rt hand.  Pt reprots she is slow and  takes increased time to complete tasks.      Hand Dominance   Dominant Hand: Right    Extremity/Trunk Assessment               Lower Extremity Assessment: Generalized weakness            Cognition Arousal/Alertness: Awake/alert Behavior During Therapy: Anxious Overall Cognitive Status: No family/caregiver present to determine baseline cognitive functioning                        Assessment/Plan    PT Assessment Patient needs continued PT services  PT Diagnosis Difficulty walking;Generalized weakness   PT Problem List Decreased strength;Decreased activity tolerance;Decreased balance;Decreased mobility;Decreased knowledge of use of DME  PT Treatment Interventions Gait training;Balance training;Functional mobility training;Therapeutic activities;Therapeutic exercise;DME instruction   PT Goals (Current goals can be found in the Care Plan section) Acute Rehab PT Goals Patient Stated Goal: not fall PT Goal Formulation: With patient Time For Goal Achievement: 12/17/13 Potential to Achieve Goals: Good    Frequency Min 3X/week    End of Session Equipment Utilized During Treatment: Gait belt Activity Tolerance: Patient limited by fatigue Patient left: in bed;with call bell/phone within reach;with bed alarm set Nurse Communication: Mobility status         Time: 1049-1110 PT Time Calculation (min): 21 min   Charges:   PT Evaluation $Initial PT Evaluation Tier I: 1 Procedure     Kari Hall 12/03/2013, 12:13 PM
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
## 2013-12-04 LAB — GAMMA GT: GGT: 50 U/L (ref 7–51)

## 2013-12-06 LAB — URINE CULTURE

## 2013-12-10 ENCOUNTER — Other Ambulatory Visit: Payer: Self-pay | Admitting: Gastroenterology

## 2013-12-10 MED ORDER — CIPROFLOXACIN HCL 250 MG PO TABS: 250.0000 mg | ORAL_TABLET | Freq: Two times a day (BID) | ORAL | Status: DC

## 2013-12-10 NOTE — Progress Notes (Signed)
Quick Note:  She had +urine culture. I don't see where she received any antibiotics in the hospital.   I would recommend cipro 250mg  po BID for 3 days. RX printed.  Needs hospital f/u with [place] in 3 weeks. ______

## 2013-12-11 ENCOUNTER — Telehealth: Payer: Self-pay | Admitting: Internal Medicine

## 2013-12-11 NOTE — Telephone Encounter (Signed)
Kari Hall (son) was returning JL call from yesterday regarding his mother.  Please call him back at [phone]

## 2013-12-11 NOTE — Telephone Encounter (Signed)
Please fax Cipro RX I did yesterday. Please make hospital f/u with [place] in 3 weeks.  They need to request appetite stimulant from the PCP.

## 2013-12-11 NOTE — Telephone Encounter (Signed)
Will fax Rx and appointment information(Dec. 7 @ 1100)

## 2013-12-11 NOTE — Telephone Encounter (Signed)
Can not get the fax to go through with the number they gave me. Mailed  appointment letter to her.

## 2013-12-11 NOTE — Telephone Encounter (Signed)
Talked to [facility]. Mrs. Daughenbaugh stays at [facility] and the number is [phone]. Elta Guadeloupe said that we could fax the Rx to [facility]. The home does not have a fax machine to fax over the orders but they gave me the fax number to the [phone]). Son had a question about some medication to help her eat better. Please advise

## 2013-12-12 ENCOUNTER — Encounter: Payer: Self-pay | Admitting: Internal Medicine

## 2013-12-12 NOTE — Progress Notes (Signed)
APPT MADE AND LETTER SENT  °

## 2013-12-15 ENCOUNTER — Encounter (HOSPITAL_COMMUNITY): Payer: Self-pay | Admitting: Emergency Medicine

## 2013-12-24 ENCOUNTER — Encounter: Payer: Self-pay | Admitting: Cardiology

## 2014-01-05 ENCOUNTER — Other Ambulatory Visit: Payer: Self-pay | Admitting: Gastroenterology

## 2014-01-19 ENCOUNTER — Other Ambulatory Visit: Payer: Self-pay | Admitting: Gastroenterology

## 2014-01-19 ENCOUNTER — Encounter: Payer: Self-pay | Admitting: Gastroenterology

## 2014-01-19 ENCOUNTER — Ambulatory Visit (INDEPENDENT_AMBULATORY_CARE_PROVIDER_SITE_OTHER): Payer: Commercial Managed Care - HMO | Admitting: Gastroenterology

## 2014-01-19 VITALS — BP 91/62 | HR 79 | Temp 97.0°F | Ht 64.0 in | Wt 153.6 lb

## 2014-01-19 DIAGNOSIS — R634 Abnormal weight loss: Secondary | ICD-10-CM

## 2014-01-19 DIAGNOSIS — R110 Nausea: Secondary | ICD-10-CM

## 2014-01-19 DIAGNOSIS — R197 Diarrhea, unspecified: Secondary | ICD-10-CM | POA: Insufficient documentation

## 2014-01-19 DIAGNOSIS — K219 Gastro-esophageal reflux disease without esophagitis: Secondary | ICD-10-CM | POA: Insufficient documentation

## 2014-01-19 LAB — COMPREHENSIVE METABOLIC PANEL
ALT: 11 U/L (ref 0–35)
AST: 18 U/L (ref 0–37)
Albumin: 3.7 g/dL (ref 3.5–5.2)
Alkaline Phosphatase: 122 U/L — ABNORMAL HIGH (ref 39–117)
BUN: 35 mg/dL — ABNORMAL HIGH (ref 6–23)
CO2: 26 meq/L (ref 19–32)
CREATININE: 1.71 mg/dL — AB (ref 0.50–1.10)
Calcium: 8.6 mg/dL (ref 8.4–10.5)
Chloride: 95 mEq/L — ABNORMAL LOW (ref 96–112)
Glucose, Bld: 114 mg/dL — ABNORMAL HIGH (ref 70–99)
Potassium: 2.8 mEq/L — ABNORMAL LOW (ref 3.5–5.3)
Sodium: 138 mEq/L (ref 135–145)
Total Bilirubin: 0.9 mg/dL (ref 0.2–1.2)
Total Protein: 6.4 g/dL (ref 6.0–8.3)

## 2014-01-19 LAB — CBC WITH DIFFERENTIAL/PLATELET
Basophils Absolute: 0 10*3/uL (ref 0.0–0.1)
Basophils Relative: 0 % (ref 0–1)
EOS ABS: 0.1 10*3/uL (ref 0.0–0.7)
EOS PCT: 1 % (ref 0–5)
HCT: 44.5 % (ref 36.0–46.0)
Hemoglobin: 15.7 g/dL — ABNORMAL HIGH (ref 12.0–15.0)
LYMPHS PCT: 28 % (ref 12–46)
Lymphs Abs: 1.7 10*3/uL (ref 0.7–4.0)
MCH: 31.8 pg (ref 26.0–34.0)
MCHC: 35.3 g/dL (ref 30.0–36.0)
MCV: 90.3 fL (ref 78.0–100.0)
MONOS PCT: 14 % — AB (ref 3–12)
MPV: 11.8 fL (ref 9.4–12.4)
Monocytes Absolute: 0.8 10*3/uL (ref 0.1–1.0)
NEUTROS ABS: 3.4 10*3/uL (ref 1.7–7.7)
NEUTROS PCT: 57 % (ref 43–77)
Platelets: 176 10*3/uL (ref 150–400)
RBC: 4.93 MIL/uL (ref 3.87–5.11)
RDW: 15.7 % — ABNORMAL HIGH (ref 11.5–15.5)
WBC: 6 10*3/uL (ref 4.0–10.5)

## 2014-01-19 LAB — TSH: TSH: 19.367 u[IU]/mL — AB (ref 0.350–4.500)

## 2014-01-19 LAB — LIPASE: Lipase: 41 U/L (ref 0–75)

## 2014-01-19 MED ORDER — SUCRALFATE 1 GM/10ML PO SUSP: 1.0000 g | Freq: Three times a day (TID) | ORAL | Status: DC

## 2014-01-19 NOTE — Patient Instructions (Signed)
1. Please have your labs and stools done.  2. Carafate one gram before meals and at bedtime for 10 days. RX sent to [facility].

## 2014-01-19 NOTE — Progress Notes (Signed)
Primary Care Physician: Glo Herring., MD  Primary Gastroenterologist:  Garfield Cornea, MD   Chief Complaint  Patient presents with  . Follow-up    from hospital pt still complains about feeling sick    HPI: Kari Hall is a 79 y.o. female here for f/u of hospitalization for hyperkalemia/dehydration. H/o anorexia, nausea, 30 pound weight loss since August. EGD and colonoscopy on file from Sept 2015: noncritical Schatzki's ring, hiatal hernia but otherwise unremarkable, pancolonic diverticulosis and tubular adenomas. With history of psychiatric issues vs dementia, committed to [place] for evaluation in September. CT was performed on 12/02/2013 without contrast showing stable compression deformity L4, diffuse osteopenia but otherwise unremarkable.   Patient presents with her daughter today. She lives in a nursing facility and by the family. She weighed 162 pounds at her last office visit, down to 153 pounds today. Previously was in the 180s back in July. Continues to complain of daily nausea without vomiting. Daughter believes her appetite has improved some. Patient alert and oriented 3 today in the office. She denies any abdominal pain.c/o refractory heartburn for a few days occurs after meals and at rest. No exertional component. Complains of frequent watery stools which is a new finding however she cannot recall how long this pain going on. Denies any blood in the stool or melena. No fever or chills. Complains that she feels bad and weak.      Current Outpatient Prescriptions  Medication Sig Dispense Refill  . acetaminophen (TYLENOL) 500 MG tablet Take 500 mg by mouth every 6 (six) hours as needed for moderate pain.     Marland Kitchen aspirin EC 81 MG tablet Take 81 mg by mouth every morning.     Marland Kitchen atenolol (TENORMIN) 50 MG tablet Take 50 mg by mouth every morning.     . donepezil (ARICEPT) 5 MG tablet Take 5 mg by mouth every morning.     Marland Kitchen levothyroxine (SYNTHROID, LEVOTHROID) 100 MCG  tablet Take 100 mcg by mouth daily before breakfast.    . ondansetron (ZOFRAN) 4 MG tablet Take 1 tablet (4 mg total) by mouth every 6 (six) hours as needed for nausea or vomiting. 30 tablet 1  . pantoprazole (PROTONIX) 40 MG tablet TAKE 1 TABLET BY MOUTH ONCE DAILY. 28 tablet 5  . potassium chloride SA (K-DUR,KLOR-CON) 20 MEQ tablet Take 60 mEq by mouth daily.     . risperiDONE (RISPERDAL) 0.25 MG tablet Take 0.5 mg by mouth at bedtime.     . sertraline (ZOLOFT) 50 MG tablet Take 50 mg by mouth every morning.     . sucralfate (CARAFATE) 1 GM/10ML suspension Take 10 mLs (1 g total) by mouth 4 (four) times daily -  with meals and at bedtime. 420 mL 0  . [DISCONTINUED] ezetimibe-simvastatin (VYTORIN) 10-40 MG per tablet Take 1 tablet by mouth at bedtime.       No current facility-administered medications for this visit.    Allergies as of 01/19/2014  . (No Known Allergies)   Past Medical History  Diagnosis Date  . GERD (gastroesophageal reflux disease)   . Dyslipidemia   . Essential hypertension, benign   . Hypothyroidism   . IBS (irritable bowel syndrome)   . Diverticulitis   . Sinoatrial node dysfunction   . Chronic diastolic heart failure   . Pacemaker   . Anxiety   . Back pain   . Kyphosis    Past Surgical History  Procedure Laterality Date  . Cholecystectomy    .  Appendectomy    . Colectomy      secondary to diverticulitis  . Ectopic pregnancy surgery    . Pacemaker placement    . Abdominal hysterectomy    . Colonoscopy  2003    Dr. Arnoldo Morale: few scattered diverticula in transverse and descending colon  . ?thyroid surgery?    . Colonoscopy N/A 10/17/2013    Dr. Gala Romney: Noncritical Schatzki ring, not manipulated, 3 cm hiatal hernia  . Esophagogastroduodenoscopy N/A 10/17/2013    Dr. Gala Romney: Pancolonic diverticulosis, splenic flexure and ileocecal valve polyps removed, tubular adenomas. Next TCS 5 years if health permits   Family History  Problem Relation Age of Onset  .  Stroke Mother   . Emphysema Father   . Colon cancer Neg Hx    History   Social History  . Marital Status: Divorced    Spouse Name: N/A    Number of Children: 3  . Years of Education: N/A   Occupational History  . retired from post office    Social History Main Topics  . Smoking status: Former Smoker -- 0.50 packs/day for 45 years    Types: Cigarettes    Quit date: 02/24/1979  . Smokeless tobacco: Never Used  . Alcohol Use: No  . Drug Use: No  . Sexual Activity: No   Other Topics Concern  . None   Social History Narrative    ROS:  General: see hpi ENT: Negative for hoarseness, difficulty swallowing , nasal congestion. CV: Negative for chest pain, angina, palpitations,  peripheral edema. +DOE. Respiratory: Negative for dyspnea at rest,   cough, sputum, wheezing. +DOE. See cardiologist tomorrow.  GI: See history of present illness. GU:  Negative for dysuria, hematuria, urinary incontinence, urinary frequency, nocturnal urination.  Endo: see hpi   Physical Examination:   BP 91/62 mmHg  Pulse 79  Temp(Src) 97 F (36.1 C)  Ht 5\' 4"  (1.626 m)  Wt 153 lb 9.6 oz (69.673 kg)  BMI 26.35 kg/m2  General: elderly WF appears anxious in no acute distress.  Eyes: No icterus. Mouth: Oropharyngeal mucosa moist and pink , no lesions erythema or exudate. Lungs: Clear to auscultation bilaterally.  Heart: Regular rate and rhythm, no murmurs rubs or gallops.  Abdomen: Bowel sounds are normal, nontender, nondistended, no hepatosplenomegaly or masses, no abdominal bruits or hernia , no rebound or guarding.   Extremities: No lower extremity edema. No clubbing or deformities. Neuro: Alert and oriented x 4   Skin: Warm and dry, no jaundice.   Psych: Alert and cooperative, normal mood and affect.+anxious  Labs:  Lab Results  Component Value Date   WBC 6.8 12/03/2013   HGB 13.5 12/03/2013   HCT 39.9 12/03/2013   MCV 92.6 12/03/2013   PLT 161 12/03/2013   Lab Results  Component  Value Date   CREATININE 1.32* 12/03/2013   BUN 55* 12/03/2013   NA 135* 12/03/2013   K 4.9 12/03/2013   CL 101 12/03/2013   CO2 19 12/03/2013   Lab Results  Component Value Date   ALT 30 12/03/2013   AST 18 12/03/2013   ALKPHOS 152* 12/03/2013   BILITOT 0.5 12/03/2013   Lab Results  Component Value Date   LIPASE 86* 12/02/2013   GGT 50 normal.  Imaging Studies: No results found.

## 2014-01-20 ENCOUNTER — Other Ambulatory Visit: Payer: Self-pay | Admitting: Gastroenterology

## 2014-01-20 ENCOUNTER — Telehealth: Payer: Self-pay | Admitting: Gastroenterology

## 2014-01-20 ENCOUNTER — Other Ambulatory Visit: Payer: Self-pay

## 2014-01-20 ENCOUNTER — Ambulatory Visit: Payer: Self-pay | Admitting: Cardiovascular Disease

## 2014-01-20 DIAGNOSIS — E876 Hypokalemia: Secondary | ICD-10-CM

## 2014-01-20 LAB — MAGNESIUM: MAGNESIUM: 1.1 mg/dL — AB (ref 1.5–2.5)

## 2014-01-20 LAB — IGA: IGA: 540 mg/dL — AB (ref 69–380)

## 2014-01-20 NOTE — Telephone Encounter (Signed)
1. Stop current potassium order. 2. Start potassium chloride 84mq po TID for 3 days. Get in 1-2 doses today.  3. Check Met-7 on Thursday Dec 10th. 4. Start potassium chloride 211m po TID on 01/23/14. 5. Address elevated TSH with PCP for synthroid adjustments.  6. Encourage oral intake increase.

## 2014-01-20 NOTE — Telephone Encounter (Signed)
Fax did not go through x 2. Called and was told to fax it to [facility] or Lavada Mesi at [phone]. I faxed and I told them if they do not receive it shortly to please let me know.

## 2014-01-20 NOTE — Progress Notes (Signed)
Quick Note:  Discussed with Kari Hall. Patient is actually taking potassium 5meq po daily. We will give 18meq po TID for next 3 days. Then she should go to 20meq po TID thereafter. Written order provided via telephone encounter and to be faxed to facility. ______

## 2014-01-20 NOTE — Telephone Encounter (Signed)
I first called the phone number listed [phone] and spoke to the pt's son, Kari Hall. He told me to call the facility at [phone] and speak to one of the care givers.  I called and spoke to Romie Levee who assists with the medications. She is aware of the  Potassium order. She is aware of the lab order for Thurs. She is also aware pt needs to see PCP for the synthroid adjustments. This info is being faxed to Murlean Hark, daughter-in-law at [phone] and they will call me if they do not receive the fax.  Lab order has been faxed to [facility] for Thurs.

## 2014-01-20 NOTE — Progress Notes (Signed)
Quick Note:  Please let patient's nursing home and son/daughter know that her Potassium is very low and her TSH is very high (not receiving enough of her synthroid). Her creatinine is elevated too.  1.Acutely I will address the potassium. She is listed as being on potassium 63mq TID. Let's increase that to 438m TID for the next 3 days.  2.She needs repeat met- on Thursday.  3.Add magnesium level to existing labs.  4.Nursing home needs to request attending to increase synthroid for abnormal TSH. ______

## 2014-01-20 NOTE — Addendum Note (Signed)
Addended by: Everardo All on: 01/20/2014 02:47 PM   Modules accepted: Orders

## 2014-01-20 NOTE — Telephone Encounter (Signed)
Please call patient's nursing home facility. When I saw patient yesterday, I noticed my name was listed on the [facility]. I have not signed for her MAR before, they need to get refills from PCP, Dr. Gerarda Fraction.  We will only fill GI medications and currently she is up to date on those.

## 2014-01-20 NOTE — Assessment & Plan Note (Signed)
Ongoing complaint of nausea and anorexia, persistent weight loss. Daughter feels symptoms are anxiety driving. She is concerned about mental status change and wonders if all dementia vs psychiatric related. Encouraged her to address with Dr. Gerarda Fraction. ?may require neurology referral and may benefit from anxiolytic.   Check labs and stool studies. Further recommendations to follow. May need to consider mesenteric ischemia. Patient has elevated creatinine which may CTA difficult.

## 2014-01-20 NOTE — Progress Notes (Signed)
Quick Note:  See separate phone note. ______

## 2014-01-20 NOTE — Assessment & Plan Note (Signed)
Check for C.diff and other infectious etiologies.

## 2014-01-20 NOTE — Assessment & Plan Note (Signed)
Add short course of Carafate suspension.

## 2014-01-21 ENCOUNTER — Other Ambulatory Visit: Payer: Self-pay | Admitting: Gastroenterology

## 2014-01-21 ENCOUNTER — Other Ambulatory Visit: Payer: Self-pay

## 2014-01-21 DIAGNOSIS — R790 Abnormal level of blood mineral: Secondary | ICD-10-CM

## 2014-01-21 LAB — GASTROINTESTINAL PATHOGEN PANEL PCR

## 2014-01-21 LAB — TISSUE TRANSGLUTAMINASE, IGA: TISSUE TRANSGLUTAMINASE AB, IGA: 1 U/mL (ref <4)

## 2014-01-21 MED ORDER — MAGNESIUM OXIDE -MG SUPPLEMENT 500 MG PO TABS: 500.0000 mg | ORAL_TABLET | Freq: Two times a day (BID) | ORAL | Status: DC

## 2014-01-21 NOTE — Progress Notes (Signed)
cc'ed to pcp °

## 2014-01-21 NOTE — Progress Notes (Signed)
Quick Note:  . ______ 

## 2014-01-21 NOTE — Progress Notes (Signed)
Quick Note:  Magnesium low as well. Need to replete. Give magnesium 546m PO BID for 3 days. RX entered. Recheck Magnesium level tomorrow with the Met-7 that is planned. ______

## 2014-01-21 NOTE — Addendum Note (Signed)
Addended by: Mahala Menghini on: 01/21/2014 08:13 AM   Modules accepted: Orders

## 2014-01-22 LAB — COMPREHENSIVE METABOLIC PANEL
ALT: 11 U/L (ref 0–35)
AST: 18 U/L (ref 0–37)
Albumin: 3.5 g/dL (ref 3.5–5.2)
Alkaline Phosphatase: 97 U/L (ref 39–117)
BILIRUBIN TOTAL: 0.8 mg/dL (ref 0.2–1.2)
BUN: 17 mg/dL (ref 6–23)
CHLORIDE: 101 meq/L (ref 96–112)
CO2: 26 mEq/L (ref 19–32)
Calcium: 9.2 mg/dL (ref 8.4–10.5)
Creat: 0.95 mg/dL (ref 0.50–1.10)
GLUCOSE: 104 mg/dL — AB (ref 70–99)
Potassium: 4.7 mEq/L (ref 3.5–5.3)
Sodium: 138 mEq/L (ref 135–145)
Total Protein: 6.2 g/dL (ref 6.0–8.3)

## 2014-01-22 LAB — MAGNESIUM: MAGNESIUM: 1.2 mg/dL — AB (ref 1.5–2.5)

## 2014-01-23 ENCOUNTER — Other Ambulatory Visit: Payer: Self-pay | Admitting: Gastroenterology

## 2014-01-23 ENCOUNTER — Other Ambulatory Visit: Payer: Self-pay

## 2014-01-23 DIAGNOSIS — R790 Abnormal level of blood mineral: Secondary | ICD-10-CM

## 2014-01-23 NOTE — Progress Notes (Signed)
Quick Note:  Your Magnesium is still low. We need to continue magnesium 500mg  po BID for 5 more days. Repeat MET-7 and Magnesium next week.  Start new potassium order 34meq po tid as previously provided. ______

## 2014-01-23 NOTE — Progress Notes (Signed)
Quick Note:  Did she ever collect stool specimen? ______

## 2014-01-26 ENCOUNTER — Ambulatory Visit (INDEPENDENT_AMBULATORY_CARE_PROVIDER_SITE_OTHER): Payer: Commercial Managed Care - HMO | Admitting: Cardiovascular Disease

## 2014-01-26 ENCOUNTER — Encounter: Payer: Self-pay | Admitting: Cardiovascular Disease

## 2014-01-26 VITALS — BP 98/78 | HR 74 | Ht 67.0 in | Wt 152.0 lb

## 2014-01-26 DIAGNOSIS — I495 Sick sinus syndrome: Secondary | ICD-10-CM

## 2014-01-26 DIAGNOSIS — F419 Anxiety disorder, unspecified: Secondary | ICD-10-CM

## 2014-01-26 DIAGNOSIS — Z950 Presence of cardiac pacemaker: Secondary | ICD-10-CM

## 2014-01-26 DIAGNOSIS — I10 Essential (primary) hypertension: Secondary | ICD-10-CM

## 2014-01-26 DIAGNOSIS — I5032 Chronic diastolic (congestive) heart failure: Secondary | ICD-10-CM

## 2014-01-26 NOTE — Progress Notes (Signed)
Patient ID: Kari Hall, female   DOB: 1934-10-23, 79 y.o.   MRN: [id number]      SUBJECTIVE: Patient presents for follow up chronic diastolic heart failure and sinus node dysfunction with pacemaker. Device interrogation on 08/05/13 was normal. She is here with her son, Elta Guadeloupe (who manages the assisted living facility in [place], where she resides) and daughter, [name]. The patient denies chest pain and shortness of breath. Her primary complaint is abdominal pain, which has been her complaint for several months. GI workup has been relatively unremarkable thus far. Her primary issue is severe anxiety which has been debilitating at times, as per Elta Guadeloupe and [name]. She used to enjoy cooking up until one year ago and does anymore. She complains of the inability to walk but if she is given assistance, she has no difficulty whatsoever.   Review of Systems: As per "subjective", otherwise negative.  No Known Allergies  Current Outpatient Prescriptions  Medication Sig Dispense Refill  . acetaminophen (TYLENOL) 500 MG tablet Take 500 mg by mouth every 6 (six) hours as needed for moderate pain.     Marland Kitchen aspirin EC 81 MG tablet Take 81 mg by mouth every morning.     Marland Kitchen atenolol (TENORMIN) 50 MG tablet Take 50 mg by mouth every morning.     . donepezil (ARICEPT) 5 MG tablet Take 5 mg by mouth every morning.     Marland Kitchen levothyroxine (SYNTHROID, LEVOTHROID) 100 MCG tablet Take 100 mcg by mouth daily before breakfast.    . Magnesium Oxide 500 MG TABS Take 1 tablet (500 mg total) by mouth 2 (two) times daily. For 3 days. 6 tablet 0  . ondansetron (ZOFRAN) 4 MG tablet Take 1 tablet (4 mg total) by mouth every 6 (six) hours as needed for nausea or vomiting. 30 tablet 1  . pantoprazole (PROTONIX) 40 MG tablet TAKE 1 TABLET BY MOUTH ONCE DAILY. 28 tablet 5  . potassium chloride SA (K-DUR,KLOR-CON) 20 MEQ tablet Take 60 mEq by mouth daily.     . risperiDONE (RISPERDAL) 0.25 MG tablet Take 0.5 mg by mouth at bedtime.     .  sertraline (ZOLOFT) 50 MG tablet Take 50 mg by mouth every morning.     . sucralfate (CARAFATE) 1 GM/10ML suspension Take 10 mLs (1 g total) by mouth 4 (four) times daily -  with meals and at bedtime. 420 mL 0  . [DISCONTINUED] ezetimibe-simvastatin (VYTORIN) 10-40 MG per tablet Take 1 tablet by mouth at bedtime.       No current facility-administered medications for this visit.    Past Medical History  Diagnosis Date  . GERD (gastroesophageal reflux disease)   . Dyslipidemia   . Essential hypertension, benign   . Hypothyroidism   . IBS (irritable bowel syndrome)   . Diverticulitis   . Sinoatrial node dysfunction   . Chronic diastolic heart failure   . Pacemaker   . Anxiety   . Back pain   . Kyphosis     Past Surgical History  Procedure Laterality Date  . Cholecystectomy    . Appendectomy    . Colectomy      secondary to diverticulitis  . Ectopic pregnancy surgery    . Pacemaker placement    . Abdominal hysterectomy    . Colonoscopy  2003    Dr. Arnoldo Morale: few scattered diverticula in transverse and descending colon  . ?thyroid surgery?    . Colonoscopy N/A 10/17/2013    Dr. Gala Romney: Noncritical Schatzki ring, not  manipulated, 3 cm hiatal hernia  . Esophagogastroduodenoscopy N/A 10/17/2013    Dr. Gala Romney: Pancolonic diverticulosis, splenic flexure and ileocecal valve polyps removed, tubular adenomas. Next TCS 5 years if health permits    History   Social History  . Marital Status: Divorced    Spouse Name: N/A    Number of Children: 3  . Years of Education: N/A   Occupational History  . retired from post office    Social History Main Topics  . Smoking status: Former Smoker -- 0.50 packs/day for 45 years    Types: Cigarettes    Quit date: 02/24/1979  . Smokeless tobacco: Never Used  . Alcohol Use: No  . Drug Use: No  . Sexual Activity: No   Other Topics Concern  . Not on file   Social History Narrative     Filed Vitals:   01/26/14 1415  BP: 98/78  Pulse: 74    Height: 5\' 7"  (1.702 m)  Weight: 152 lb (68.947 kg)    PHYSICAL EXAM General: NAD HEENT: Normal. Neck: No JVD, no thyromegaly. Lungs: Clear to auscultation bilaterally with normal respiratory effort. CV: Nondisplaced PMI.  Regular rate and rhythm, normal S1/S2, no S3/S4, no murmur. No pretibial or periankle edema.  No carotid bruit.  Normal pedal pulses.  Abdomen: Soft, no distention.  Neurologic: Alert.  Psych: Flat affect, and angry at times. Skin: Normal. Musculoskeletal: No gross deformities. Extremities: No clubbing or cyanosis.   ECG: Most recent ECG reviewed.      ASSESSMENT AND PLAN: 1. Chronic diastolic heart failure: Euvolemic and stable. No longer on Lasix but on KCl. BMET ordered for today by a different provider. She had an admission for hyperkalemia in 11/2013. Has grade I diastolic dysfunction with normal BP today. 2. Essential HTN: Low normal on atenolol 50 mg daily. 3. SA node dysfunction s/p pacemaker: Normal device function on 08/05/13. Followed by Dr. Lovena Le. 4. Severe anxiety: I will try and assist with a referral to geriatric psychiatry, as her symptoms have become debilitating and are manifesting themselves as functional GI problems (pain, increased defecation frequency), etc.  Dispo: f/u 6 months.   Kate Sable, M.D., F.A.C.C.

## 2014-01-26 NOTE — Patient Instructions (Addendum)
Your physician wants you to follow-up in: 6 months You will receive a reminder letter in the mail two months in advance. If you don't receive a letter, please call our office to schedule the follow-up appointment.  Your physician recommends that you continue on your current medications as directed. Please refer to the Current Medication list given to you today.    You have been referred to a Geriatric Psychiatrist [facility]   [phone] in [place]        Thank you for choosing [facility] !

## 2014-01-27 LAB — BASIC METABOLIC PANEL
BUN: 14 mg/dL (ref 6–23)
CALCIUM: 9.2 mg/dL (ref 8.4–10.5)
CHLORIDE: 102 meq/L (ref 96–112)
CO2: 30 meq/L (ref 19–32)
Creat: 1.29 mg/dL — ABNORMAL HIGH (ref 0.50–1.10)
Glucose, Bld: 99 mg/dL (ref 70–99)
POTASSIUM: 4.3 meq/L (ref 3.5–5.3)
SODIUM: 136 meq/L (ref 135–145)

## 2014-01-27 LAB — MAGNESIUM: Magnesium: 1.8 mg/dL (ref 1.5–2.5)

## 2014-02-04 NOTE — Progress Notes (Signed)
Quick Note:  Check and see status on stool studies. Patient needs MET-7, serum protein electropharesis, magnesium levels. Please make sure TSH was addressed by attending, ie dose of medication increased. ______

## 2014-02-08 ENCOUNTER — Emergency Department (HOSPITAL_COMMUNITY)
Admission: EM | Admit: 2014-02-08 | Discharge: 2014-02-08 | Disposition: A | Discharge status: Home / Self Care | Payer: Medicare HMO | Attending: Emergency Medicine | Admitting: Emergency Medicine

## 2014-02-08 ENCOUNTER — Emergency Department (HOSPITAL_COMMUNITY): Payer: Medicare HMO

## 2014-02-08 ENCOUNTER — Encounter (HOSPITAL_COMMUNITY): Payer: Self-pay

## 2014-02-08 DIAGNOSIS — Z90710 Acquired absence of both cervix and uterus: Secondary | ICD-10-CM | POA: Insufficient documentation

## 2014-02-08 DIAGNOSIS — N390 Urinary tract infection, site not specified: Secondary | ICD-10-CM | POA: Diagnosis not present

## 2014-02-08 DIAGNOSIS — I5032 Chronic diastolic (congestive) heart failure: Secondary | ICD-10-CM | POA: Diagnosis not present

## 2014-02-08 DIAGNOSIS — Z7982 Long term (current) use of aspirin: Secondary | ICD-10-CM | POA: Insufficient documentation

## 2014-02-08 DIAGNOSIS — E860 Dehydration: Secondary | ICD-10-CM | POA: Diagnosis not present

## 2014-02-08 DIAGNOSIS — R0602 Shortness of breath: Secondary | ICD-10-CM | POA: Diagnosis present

## 2014-02-08 DIAGNOSIS — N289 Disorder of kidney and ureter, unspecified: Secondary | ICD-10-CM | POA: Diagnosis not present

## 2014-02-08 DIAGNOSIS — Z87891 Personal history of nicotine dependence: Secondary | ICD-10-CM | POA: Insufficient documentation

## 2014-02-08 DIAGNOSIS — Z9089 Acquired absence of other organs: Secondary | ICD-10-CM | POA: Insufficient documentation

## 2014-02-08 DIAGNOSIS — F419 Anxiety disorder, unspecified: Secondary | ICD-10-CM | POA: Insufficient documentation

## 2014-02-08 DIAGNOSIS — Z950 Presence of cardiac pacemaker: Secondary | ICD-10-CM | POA: Diagnosis not present

## 2014-02-08 DIAGNOSIS — Z79899 Other long term (current) drug therapy: Secondary | ICD-10-CM | POA: Insufficient documentation

## 2014-02-08 DIAGNOSIS — Z792 Long term (current) use of antibiotics: Secondary | ICD-10-CM | POA: Diagnosis not present

## 2014-02-08 DIAGNOSIS — I10 Essential (primary) hypertension: Secondary | ICD-10-CM | POA: Insufficient documentation

## 2014-02-08 DIAGNOSIS — E785 Hyperlipidemia, unspecified: Secondary | ICD-10-CM | POA: Insufficient documentation

## 2014-02-08 DIAGNOSIS — F0390 Unspecified dementia without behavioral disturbance: Secondary | ICD-10-CM | POA: Insufficient documentation

## 2014-02-08 HISTORY — DX: Unspecified dementia, unspecified severity, without behavioral disturbance, psychotic disturbance, mood disturbance, and anxiety: F03.90

## 2014-02-08 LAB — URINALYSIS, ROUTINE W REFLEX MICROSCOPIC
Glucose, UA: NEGATIVE mg/dL
Ketones, ur: NEGATIVE mg/dL
NITRITE: NEGATIVE
Protein, ur: NEGATIVE mg/dL
SPECIFIC GRAVITY, URINE: 1.02 (ref 1.005–1.030)
UROBILINOGEN UA: 0.2 mg/dL (ref 0.0–1.0)
pH: 5.5 (ref 5.0–8.0)

## 2014-02-08 LAB — I-STAT CHEM 8, ED
BUN: 38 mg/dL — ABNORMAL HIGH (ref 6–23)
Calcium, Ion: 1.15 mmol/L (ref 1.13–1.30)
Chloride: 104 mEq/L (ref 96–112)
Creatinine, Ser: 1.7 mg/dL — ABNORMAL HIGH (ref 0.50–1.10)
Glucose, Bld: 95 mg/dL (ref 70–99)
HEMATOCRIT: 42 % (ref 36.0–46.0)
HEMOGLOBIN: 14.3 g/dL (ref 12.0–15.0)
POTASSIUM: 3.8 mmol/L (ref 3.5–5.1)
SODIUM: 137 mmol/L (ref 135–145)
TCO2: 21 mmol/L (ref 0–100)

## 2014-02-08 LAB — I-STAT TROPONIN, ED
TROPONIN I, POC: 0.03 ng/mL (ref 0.00–0.08)
Troponin i, poc: 0.04 ng/mL (ref 0.00–0.08)

## 2014-02-08 LAB — BASIC METABOLIC PANEL
Anion gap: 10 (ref 5–15)
BUN: 46 mg/dL — AB (ref 6–23)
CHLORIDE: 102 meq/L (ref 96–112)
CO2: 24 mmol/L (ref 19–32)
CREATININE: 1.94 mg/dL — AB (ref 0.50–1.10)
Calcium: 9.5 mg/dL (ref 8.4–10.5)
GFR calc Af Amer: 28 mL/min — ABNORMAL LOW (ref >90)
GFR, EST NON AFRICAN AMERICAN: 24 mL/min — AB (ref >90)
Glucose, Bld: 110 mg/dL — ABNORMAL HIGH (ref 70–99)
Potassium: 4.5 mmol/L (ref 3.5–5.1)
Sodium: 136 mmol/L (ref 135–145)

## 2014-02-08 LAB — CBC WITH DIFFERENTIAL/PLATELET
Basophils Absolute: 0 10*3/uL (ref 0.0–0.1)
Basophils Relative: 1 % (ref 0–1)
EOS ABS: 0.1 10*3/uL (ref 0.0–0.7)
Eosinophils Relative: 2 % (ref 0–5)
HEMATOCRIT: 45.3 % (ref 36.0–46.0)
HEMOGLOBIN: 15.2 g/dL — AB (ref 12.0–15.0)
Lymphocytes Relative: 18 % (ref 12–46)
Lymphs Abs: 1.2 10*3/uL (ref 0.7–4.0)
MCH: 32.5 pg (ref 26.0–34.0)
MCHC: 33.6 g/dL (ref 30.0–36.0)
MCV: 97 fL (ref 78.0–100.0)
MONO ABS: 1 10*3/uL (ref 0.1–1.0)
MONOS PCT: 14 % — AB (ref 3–12)
Neutro Abs: 4.4 10*3/uL (ref 1.7–7.7)
Neutrophils Relative %: 65 % (ref 43–77)
Platelets: 127 10*3/uL — ABNORMAL LOW (ref 150–400)
RBC: 4.67 MIL/uL (ref 3.87–5.11)
RDW: 13.7 % (ref 11.5–15.5)
WBC: 6.6 10*3/uL (ref 4.0–10.5)

## 2014-02-08 LAB — URINE MICROSCOPIC-ADD ON

## 2014-02-08 LAB — BRAIN NATRIURETIC PEPTIDE: B Natriuretic Peptide: 91 pg/mL (ref 0.0–100.0)

## 2014-02-08 MED ORDER — CEPHALEXIN 500 MG PO CAPS: 500.0000 mg | ORAL_CAPSULE | Freq: Two times a day (BID) | ORAL | Status: DC

## 2014-02-08 MED ORDER — DEXTROSE 5 % IV SOLN
1.0000 g | Freq: Once | INTRAVENOUS | Status: AC
  Administered 2014-02-08: 1 g via INTRAVENOUS
  Filled 2014-02-08: qty 10

## 2014-02-08 MED ORDER — SODIUM CHLORIDE 0.9 % IV BOLUS (SEPSIS)
500.0000 mL | Freq: Once | INTRAVENOUS | Status: AC
  Administered 2014-02-08: 500 mL via INTRAVENOUS

## 2014-02-08 NOTE — ED Provider Notes (Signed)
CSN: [id number]     Arrival date & time 02/08/14  1049 History  This chart was scribed for Kari Cable, MD by Stephania Fragmin, ED Scribe. This patient was seen in room APA06/APA06 and the patient's care was started at 10:54 AM.    Chief Complaint  Patient presents with  . Shortness of Breath    Patient is a 79 y.o. female presenting with shortness of breath. The history is provided by the patient. The history is limited by the condition of the patient. No language interpreter was used.  Shortness of Breath Associated symptoms: no abdominal pain, no chest pain, no cough, no headaches and no vomiting    LEVEL 5 CAVEAT DUE TO MILD DEMENTIA  HPI Comments: HOLIDAY MCMENAMIN is a 79 y.o. female who presents to the Emergency Department complaining of shortness of breath that has been going on for "a long time." She complains of associated weakness, stating that she hasn't "eaten very much." She endorses associated diarrhea. She denies use of home oxygen. She denies fall or injury. Patient denies cough, chest pain, abdominal pain, vomiting, and headache.   Past Medical History  Diagnosis Date  . GERD (gastroesophageal reflux disease)   . Dyslipidemia   . Essential hypertension, benign   . Hypothyroidism   . IBS (irritable bowel syndrome)   . Diverticulitis   . Sinoatrial node dysfunction   . Chronic diastolic heart failure   . Pacemaker   . Anxiety   . Back pain   . Kyphosis   . Dementia    Past Surgical History  Procedure Laterality Date  . Cholecystectomy    . Appendectomy    . Colectomy      secondary to diverticulitis  . Ectopic pregnancy surgery    . Pacemaker placement    . Abdominal hysterectomy    . Colonoscopy  2003    Dr. Arnoldo Morale: few scattered diverticula in transverse and descending colon  . ?thyroid surgery?    . Colonoscopy N/A 10/17/2013    Dr. Gala Romney: Noncritical Schatzki ring, not manipulated, 3 cm hiatal hernia  . Esophagogastroduodenoscopy N/A 10/17/2013    Dr. Gala Romney:  Pancolonic diverticulosis, splenic flexure and ileocecal valve polyps removed, tubular adenomas. Next TCS 5 years if health permits   Family History  Problem Relation Age of Onset  . Stroke Mother   . Emphysema Father   . Colon cancer Neg Hx    History  Substance Use Topics  . Smoking status: Former Smoker -- 0.50 packs/day for 45 years    Types: Cigarettes    Quit date: 02/24/1979  . Smokeless tobacco: Never Used  . Alcohol Use: No   OB History    Gravida Para Term Preterm AB TAB SAB Ectopic Multiple Living   3 3 3       3      Review of Systems  Unable to perform ROS: Dementia  Respiratory: Positive for shortness of breath. Negative for cough.   Cardiovascular: Negative for chest pain.  Gastrointestinal: Positive for diarrhea. Negative for vomiting and abdominal pain.  Neurological: Negative for headaches.      Allergies  Review of patient's allergies indicates no known allergies.  Home Medications   Prior to Admission medications   Medication Sig Start Date End Date Taking? Authorizing Provider  acetaminophen (TYLENOL) 500 MG tablet Take 500 mg by mouth every 6 (six) hours as needed for moderate pain.    Yes Historical Provider, MD  aspirin EC 81 MG tablet Take 81  mg by mouth every morning.    Yes Historical Provider, MD  atenolol (TENORMIN) 50 MG tablet Take 50 mg by mouth every morning.    Yes Historical Provider, MD  donepezil (ARICEPT) 5 MG tablet Take 5 mg by mouth every morning.    Yes Historical Provider, MD  levothyroxine (SYNTHROID, LEVOTHROID) 100 MCG tablet Take 100 mcg by mouth daily before breakfast.   Yes Historical Provider, MD  ondansetron (ZOFRAN) 4 MG tablet Take 1 tablet (4 mg total) by mouth every 6 (six) hours as needed for nausea or vomiting. 12/02/13  Yes Mahala Menghini, PA-C  pantoprazole (PROTONIX) 40 MG tablet TAKE 1 TABLET BY MOUTH ONCE DAILY. 01/05/14  Yes Orvil Feil, NP  potassium chloride SA (K-DUR,KLOR-CON) 20 MEQ tablet Take 60 mEq by  mouth daily.    Yes Historical Provider, MD  risperiDONE (RISPERDAL) 0.25 MG tablet Take 0.25-0.5 mg by mouth 2 (two) times daily. Take 1 tablet in the morning and 2 tablets at bedtime.   Yes Historical Provider, MD  sertraline (ZOLOFT) 50 MG tablet Take 50 mg by mouth every morning.    Yes Historical Provider, MD  cephALEXin (KEFLEX) 500 MG capsule Take 1 capsule (500 mg total) by mouth 2 (two) times daily. 02/08/14   Kari Cable, MD   BP 103/64 mmHg  Pulse 63  Temp(Src) 99 F (37.2 C) (Rectal)  Resp 18  Ht 5\' 7"  (1.702 m)  Wt 152 lb (68.947 kg)  BMI 23.80 kg/m2  SpO2 96% Physical Exam  Nursing note and vitals reviewed.    CONSTITUTIONAL: Elderly and frail HEAD: Normocephalic/atraumatic EYES: EOMI/PERRL ENMT: Mucous membranes moist NECK: supple no meningeal signs SPINE/BACK:entire spine nontender CV: S1/S2 noted, no murmurs/rubs/gallops noted LUNGS: Lungs are clear to auscultation bilaterally, no apparent distress ABDOMEN: soft, nontender, no rebound or guarding, bowel sounds noted throughout abdomen GU:no cva tenderness NEURO: Pt is awake/alert/appropriate, moves all extremitiesx4.  No facial droop.   EXTREMITIES: pulses normal/equal, full ROM SKIN: warm, color normal PSYCH: flat affect   ED Course  Procedures  DIAGNOSTIC STUDIES: Oxygen Saturation is 94% on room air, adequate by my interpretation.    COORDINATION OF CARE:  1250pm Initial workup negative at this time except for UTI Will treat UTI Will need to call facility as well for background info    Pt monitored without any deterioration No hypoxia, no distress noted She would have frequent panic attacks while I Was in room talking to family Family reports she has frequent bouts of anxiety and will call out that she is SOB No signs of pneumonia/CHF I doubt PE (no hypoxia/tachypnea) EKG unchanged while in ED (no acute ST changes when compared to previous) Troponin unremarkable Family feels pt is at  baseline and agreeable to return to facility She does have uti.  She has mild renal insufficiency - i advised to have pt f/u with PCP in one week for recheck I don't feel admission warranted as patient is at baseline      Medications  cefTRIAXone (ROCEPHIN) 1 g in dextrose 5 % 50 mL IVPB (0 g Intravenous Stopped 02/08/14 1355)  sodium chloride 0.9 % bolus 500 mL (0 mLs Intravenous Stopped 02/08/14 1405)      Labs Review Labs Reviewed  CBC WITH DIFFERENTIAL - Abnormal; Notable for the following:    Hemoglobin 15.2 (*)    Platelets 127 (*)    Monocytes Relative 14 (*)    All other components within normal limits  BASIC METABOLIC PANEL -  Abnormal; Notable for the following:    Glucose, Bld 110 (*)    BUN 46 (*)    Creatinine, Ser 1.94 (*)    GFR calc non Af Amer 24 (*)    GFR calc Af Amer 28 (*)    All other components within normal limits  URINALYSIS, ROUTINE W REFLEX MICROSCOPIC - Abnormal; Notable for the following:    Color, Urine AMBER (*)    APPearance CLOUDY (*)    Hgb urine dipstick SMALL (*)    Bilirubin Urine MODERATE (*)    Leukocytes, UA LARGE (*)    All other components within normal limits  URINE MICROSCOPIC-ADD ON - Abnormal; Notable for the following:    Squamous Epithelial / LPF FEW (*)    Bacteria, UA MANY (*)    All other components within normal limits  I-STAT CHEM 8, ED - Abnormal; Notable for the following:    BUN 38 (*)    Creatinine, Ser 1.70 (*)    All other components within normal limits  URINE CULTURE  BRAIN NATRIURETIC PEPTIDE  I-STAT TROPOININ, ED  I-STAT TROPOININ, ED    Imaging Review Dg Chest Portable 1 View  02/08/2014   CLINICAL DATA:  Shortness of breath for long time, recent weakness, loss of appetite, history dementia, hypertension, former smoker, GERD  EXAM: PORTABLE CHEST - 1 VIEW  COMPARISON:  Portable exam 1116 hr compared to 10/16/2013  FINDINGS: LEFT subclavian transvenous pacemaker leads project over RIGHT atrium and RIGHT  ventricle.  Borderline enlargement of cardiac silhouette.  Mediastinal contours and pulmonary vascularity normal.  Emphysematous changes consistent with COPD.  No definite acute infiltrate, pleural effusion or pneumothorax.  Bones demineralized.  Old fracture of the posterior LEFT seventh rib.  IMPRESSION: COPD changes.  Pacemaker.  No acute abnormalities.   Electronically Signed   By: Lavonia Dana M.D.   On: 02/08/2014 11:51     EKG Interpretation   Date/Time:  Sunday February 08 2014 14:36:00 EST Ventricular Rate:  61 PR Interval:  170 QRS Duration: 78 QT Interval:  462 QTC Calculation: 465 R Axis:   -22 Text Interpretation:  Ectopic atrial rhythm Atrial premature complex  Borderline left axis deviation Probable anteroseptal infarct, old ads No  significant change since last tracing Confirmed by Christy Gentles  MD, [place]  [phone]) on 02/08/2014 2:52:31 PM      MDM   Final diagnoses:  Shortness of breath  UTI (lower urinary tract infection)  Dehydration  Renal insufficiency     Nursing notes including past medical history and social history reviewed and considered in documentation xrays/imaging reviewed by myself and considered during evaluation Labs/vital reviewed myself and considered during evaluation Previous records reviewed and considered   I personally performed the services described in this documentation, which was scribed in my presence. The recorded information has been reviewed and is accurate.     Kari Cable, MD 02/08/14 [phone]

## 2014-02-08 NOTE — ED Notes (Signed)
Ems reports pt c/o SOB.  Denies pain.  Reports pt very anxious.  CBG 103.

## 2014-02-08 NOTE — ED Notes (Signed)
Patient's urine dark with odor. EDP made aware.

## 2014-02-09 ENCOUNTER — Telehealth: Payer: Self-pay | Admitting: Cardiovascular Disease

## 2014-02-09 LAB — URINE CULTURE: Colony Count: 60000

## 2014-02-09 NOTE — Telephone Encounter (Signed)
Explained to daughter in law that pt was referred to geriatric psychiatry for her severe anxiety.They have not yet heard back from them.I asked her to call me back later this week if she has not heard anything from them

## 2014-02-09 NOTE — Telephone Encounter (Signed)
Please fax order for PT to [facility] @ [phone] / tgs

## 2014-02-11 ENCOUNTER — Other Ambulatory Visit: Payer: Self-pay

## 2014-02-11 ENCOUNTER — Other Ambulatory Visit: Payer: Self-pay | Admitting: Gastroenterology

## 2014-02-11 DIAGNOSIS — R899 Unspecified abnormal finding in specimens from other organs, systems and tissues: Secondary | ICD-10-CM

## 2014-02-11 DIAGNOSIS — R634 Abnormal weight loss: Secondary | ICD-10-CM

## 2014-02-16 ENCOUNTER — Encounter (HOSPITAL_COMMUNITY): Payer: Self-pay

## 2014-02-16 ENCOUNTER — Emergency Department (HOSPITAL_COMMUNITY): Payer: Commercial Managed Care - HMO

## 2014-02-16 ENCOUNTER — Inpatient Hospital Stay (HOSPITAL_COMMUNITY)
Admission: EM | Admit: 2014-02-16 | Discharge: 2014-02-19 | DRG: 682 | Disposition: A | Discharge status: Nursing Home (Includes ICF) | Payer: Commercial Managed Care - HMO | Attending: Internal Medicine | Admitting: Internal Medicine

## 2014-02-16 DIAGNOSIS — Z87891 Personal history of nicotine dependence: Secondary | ICD-10-CM

## 2014-02-16 DIAGNOSIS — I129 Hypertensive chronic kidney disease with stage 1 through stage 4 chronic kidney disease, or unspecified chronic kidney disease: Secondary | ICD-10-CM | POA: Diagnosis present

## 2014-02-16 DIAGNOSIS — E785 Hyperlipidemia, unspecified: Secondary | ICD-10-CM | POA: Diagnosis present

## 2014-02-16 DIAGNOSIS — Z950 Presence of cardiac pacemaker: Secondary | ICD-10-CM | POA: Diagnosis present

## 2014-02-16 DIAGNOSIS — I10 Essential (primary) hypertension: Secondary | ICD-10-CM

## 2014-02-16 DIAGNOSIS — K219 Gastro-esophageal reflux disease without esophagitis: Secondary | ICD-10-CM | POA: Diagnosis present

## 2014-02-16 DIAGNOSIS — Z79899 Other long term (current) drug therapy: Secondary | ICD-10-CM

## 2014-02-16 DIAGNOSIS — E861 Hypovolemia: Secondary | ICD-10-CM | POA: Diagnosis present

## 2014-02-16 DIAGNOSIS — Z6822 Body mass index (BMI) 22.0-22.9, adult: Secondary | ICD-10-CM

## 2014-02-16 DIAGNOSIS — Z823 Family history of stroke: Secondary | ICD-10-CM

## 2014-02-16 DIAGNOSIS — N183 Chronic kidney disease, stage 3 unspecified: Secondary | ICD-10-CM | POA: Diagnosis present

## 2014-02-16 DIAGNOSIS — N189 Chronic kidney disease, unspecified: Secondary | ICD-10-CM

## 2014-02-16 DIAGNOSIS — N289 Disorder of kidney and ureter, unspecified: Secondary | ICD-10-CM

## 2014-02-16 DIAGNOSIS — Z87440 Personal history of urinary (tract) infections: Secondary | ICD-10-CM

## 2014-02-16 DIAGNOSIS — N179 Acute kidney failure, unspecified: Principal | ICD-10-CM | POA: Diagnosis present

## 2014-02-16 DIAGNOSIS — E43 Unspecified severe protein-calorie malnutrition: Secondary | ICD-10-CM | POA: Diagnosis present

## 2014-02-16 DIAGNOSIS — G309 Alzheimer's disease, unspecified: Secondary | ICD-10-CM | POA: Diagnosis present

## 2014-02-16 DIAGNOSIS — L89222 Pressure ulcer of left hip, stage 2: Secondary | ICD-10-CM | POA: Diagnosis present

## 2014-02-16 DIAGNOSIS — Z825 Family history of asthma and other chronic lower respiratory diseases: Secondary | ICD-10-CM

## 2014-02-16 DIAGNOSIS — Z792 Long term (current) use of antibiotics: Secondary | ICD-10-CM | POA: Diagnosis not present

## 2014-02-16 DIAGNOSIS — E039 Hypothyroidism, unspecified: Secondary | ICD-10-CM | POA: Diagnosis present

## 2014-02-16 DIAGNOSIS — F0281 Dementia in other diseases classified elsewhere with behavioral disturbance: Secondary | ICD-10-CM | POA: Diagnosis present

## 2014-02-16 DIAGNOSIS — L89202 Pressure ulcer of unspecified hip, stage 2: Secondary | ICD-10-CM | POA: Diagnosis present

## 2014-02-16 DIAGNOSIS — I5032 Chronic diastolic (congestive) heart failure: Secondary | ICD-10-CM | POA: Diagnosis present

## 2014-02-16 DIAGNOSIS — D696 Thrombocytopenia, unspecified: Secondary | ICD-10-CM | POA: Diagnosis present

## 2014-02-16 DIAGNOSIS — F03918 Unspecified dementia, unspecified severity, with other behavioral disturbance: Secondary | ICD-10-CM | POA: Diagnosis present

## 2014-02-16 DIAGNOSIS — Z7982 Long term (current) use of aspirin: Secondary | ICD-10-CM

## 2014-02-16 DIAGNOSIS — F411 Generalized anxiety disorder: Secondary | ICD-10-CM | POA: Diagnosis present

## 2014-02-16 DIAGNOSIS — R197 Diarrhea, unspecified: Secondary | ICD-10-CM | POA: Diagnosis present

## 2014-02-16 DIAGNOSIS — R627 Adult failure to thrive: Secondary | ICD-10-CM | POA: Diagnosis present

## 2014-02-16 DIAGNOSIS — Z66 Do not resuscitate: Secondary | ICD-10-CM | POA: Diagnosis present

## 2014-02-16 DIAGNOSIS — E860 Dehydration: Secondary | ICD-10-CM | POA: Diagnosis present

## 2014-02-16 DIAGNOSIS — R339 Retention of urine, unspecified: Secondary | ICD-10-CM | POA: Diagnosis present

## 2014-02-16 DIAGNOSIS — F0391 Unspecified dementia with behavioral disturbance: Secondary | ICD-10-CM | POA: Diagnosis present

## 2014-02-16 DIAGNOSIS — R109 Unspecified abdominal pain: Secondary | ICD-10-CM

## 2014-02-16 DIAGNOSIS — K589 Irritable bowel syndrome without diarrhea: Secondary | ICD-10-CM | POA: Diagnosis present

## 2014-02-16 DIAGNOSIS — R531 Weakness: Secondary | ICD-10-CM | POA: Diagnosis present

## 2014-02-16 DIAGNOSIS — Z7401 Bed confinement status: Secondary | ICD-10-CM | POA: Diagnosis not present

## 2014-02-16 DIAGNOSIS — R52 Pain, unspecified: Secondary | ICD-10-CM

## 2014-02-16 DIAGNOSIS — R4189 Other symptoms and signs involving cognitive functions and awareness: Secondary | ICD-10-CM | POA: Diagnosis present

## 2014-02-16 LAB — URINALYSIS, ROUTINE W REFLEX MICROSCOPIC
BILIRUBIN URINE: NEGATIVE
GLUCOSE, UA: NEGATIVE mg/dL
HGB URINE DIPSTICK: NEGATIVE
Ketones, ur: NEGATIVE mg/dL
Leukocytes, UA: NEGATIVE
Nitrite: NEGATIVE
Protein, ur: NEGATIVE mg/dL
SPECIFIC GRAVITY, URINE: 1.02 (ref 1.005–1.030)
UROBILINOGEN UA: 0.2 mg/dL (ref 0.0–1.0)
pH: 5.5 (ref 5.0–8.0)

## 2014-02-16 LAB — COMPREHENSIVE METABOLIC PANEL
ALK PHOS: 66 U/L (ref 39–117)
ALT: 18 U/L (ref 0–35)
AST: 22 U/L (ref 0–37)
Albumin: 3.4 g/dL — ABNORMAL LOW (ref 3.5–5.2)
Anion gap: 9 (ref 5–15)
BILIRUBIN TOTAL: 0.9 mg/dL (ref 0.3–1.2)
BUN: 47 mg/dL — AB (ref 6–23)
CHLORIDE: 101 meq/L (ref 96–112)
CO2: 25 mmol/L (ref 19–32)
Calcium: 8.9 mg/dL (ref 8.4–10.5)
Creatinine, Ser: 2.29 mg/dL — ABNORMAL HIGH (ref 0.50–1.10)
GFR calc Af Amer: 23 mL/min — ABNORMAL LOW (ref >90)
GFR calc non Af Amer: 19 mL/min — ABNORMAL LOW (ref >90)
Glucose, Bld: 97 mg/dL (ref 70–99)
Potassium: 3.8 mmol/L (ref 3.5–5.1)
Sodium: 135 mmol/L (ref 135–145)
Total Protein: 5.9 g/dL — ABNORMAL LOW (ref 6.0–8.3)

## 2014-02-16 LAB — CBC WITH DIFFERENTIAL/PLATELET
Basophils Absolute: 0 10*3/uL (ref 0.0–0.1)
Basophils Relative: 0 % (ref 0–1)
Eosinophils Absolute: 0.1 10*3/uL (ref 0.0–0.7)
Eosinophils Relative: 1 % (ref 0–5)
HEMATOCRIT: 43.6 % (ref 36.0–46.0)
HEMOGLOBIN: 14.3 g/dL (ref 12.0–15.0)
LYMPHS ABS: 1.2 10*3/uL (ref 0.7–4.0)
LYMPHS PCT: 14 % (ref 12–46)
MCH: 31.8 pg (ref 26.0–34.0)
MCHC: 32.8 g/dL (ref 30.0–36.0)
MCV: 97.1 fL (ref 78.0–100.0)
MONOS PCT: 12 % (ref 3–12)
Monocytes Absolute: 0.9 10*3/uL (ref 0.1–1.0)
NEUTROS ABS: 5.9 10*3/uL (ref 1.7–7.7)
Neutrophils Relative %: 73 % (ref 43–77)
Platelets: 132 10*3/uL — ABNORMAL LOW (ref 150–400)
RBC: 4.49 MIL/uL (ref 3.87–5.11)
RDW: 13.3 % (ref 11.5–15.5)
WBC: 8.1 10*3/uL (ref 4.0–10.5)

## 2014-02-16 LAB — TSH: TSH: 0.747 u[IU]/mL (ref 0.350–4.500)

## 2014-02-16 MED ORDER — ACETAMINOPHEN 500 MG PO TABS
500.0000 mg | ORAL_TABLET | Freq: Four times a day (QID) | ORAL | Status: DC | PRN
  Administered 2014-02-16: 500 mg via ORAL
  Filled 2014-02-16: qty 1

## 2014-02-16 MED ORDER — ENOXAPARIN SODIUM 30 MG/0.3ML ~~LOC~~ SOLN
30.0000 mg | SUBCUTANEOUS | Status: DC
  Administered 2014-02-16 – 2014-02-17 (×2): 30 mg via SUBCUTANEOUS
  Filled 2014-02-16 (×2): qty 0.3

## 2014-02-16 MED ORDER — RISPERIDONE 1 MG PO TABS: 1.0000 mg | ORAL_TABLET | ORAL | Status: DC | PRN

## 2014-02-16 MED ORDER — LEVOTHYROXINE SODIUM 100 MCG PO TABS
100.0000 ug | ORAL_TABLET | Freq: Every day | ORAL | Status: DC
  Administered 2014-02-17 – 2014-02-19 (×3): 100 ug via ORAL
  Filled 2014-02-16 (×3): qty 1

## 2014-02-16 MED ORDER — SODIUM CHLORIDE 0.9 % IV SOLN: INTRAVENOUS | Status: DC

## 2014-02-16 MED ORDER — ASPIRIN EC 81 MG PO TBEC
81.0000 mg | DELAYED_RELEASE_TABLET | Freq: Every morning | ORAL | Status: DC
  Administered 2014-02-17 – 2014-02-19 (×3): 81 mg via ORAL
  Filled 2014-02-16 (×3): qty 1

## 2014-02-16 MED ORDER — SERTRALINE HCL 50 MG PO TABS
50.0000 mg | ORAL_TABLET | Freq: Every morning | ORAL | Status: DC
  Administered 2014-02-17 – 2014-02-19 (×3): 50 mg via ORAL
  Filled 2014-02-16 (×3): qty 1

## 2014-02-16 MED ORDER — RISPERIDONE 0.25 MG PO TABS
0.2500 mg | ORAL_TABLET | Freq: Every day | ORAL | Status: DC
  Filled 2014-02-16 (×3): qty 1

## 2014-02-16 MED ORDER — SODIUM CHLORIDE 0.9 % IV BOLUS (SEPSIS)
500.0000 mL | Freq: Once | INTRAVENOUS | Status: AC
  Administered 2014-02-16: 500 mL via INTRAVENOUS

## 2014-02-16 MED ORDER — PANTOPRAZOLE SODIUM 40 MG PO TBEC
40.0000 mg | DELAYED_RELEASE_TABLET | Freq: Every day | ORAL | Status: DC
  Administered 2014-02-16 – 2014-02-19 (×4): 40 mg via ORAL
  Filled 2014-02-16 (×4): qty 1

## 2014-02-16 MED ORDER — SODIUM CHLORIDE 0.9 % IV SOLN
INTRAVENOUS | Status: DC
  Administered 2014-02-16 – 2014-02-18 (×3): via INTRAVENOUS

## 2014-02-16 MED ORDER — RISPERIDONE 0.5 MG PO TABS
0.5000 mg | ORAL_TABLET | Freq: Every day | ORAL | Status: DC
  Administered 2014-02-16: 0.5 mg via ORAL
  Filled 2014-02-16: qty 1

## 2014-02-16 MED ORDER — ONDANSETRON HCL 4 MG PO TABS: 4.0000 mg | ORAL_TABLET | Freq: Four times a day (QID) | ORAL | Status: DC | PRN

## 2014-02-16 MED ORDER — ONDANSETRON HCL 4 MG/2ML IJ SOLN
4.0000 mg | Freq: Four times a day (QID) | INTRAMUSCULAR | Status: DC | PRN
  Administered 2014-02-16: 4 mg via INTRAVENOUS
  Filled 2014-02-16: qty 2

## 2014-02-16 MED ORDER — DONEPEZIL HCL 5 MG PO TABS
5.0000 mg | ORAL_TABLET | Freq: Every morning | ORAL | Status: DC
  Administered 2014-02-17 – 2014-02-19 (×3): 5 mg via ORAL
  Filled 2014-02-16 (×3): qty 1

## 2014-02-16 MED ORDER — ATENOLOL 25 MG PO TABS
50.0000 mg | ORAL_TABLET | Freq: Every morning | ORAL | Status: DC
  Administered 2014-02-17 – 2014-02-19 (×3): 50 mg via ORAL
  Filled 2014-02-16 (×3): qty 2

## 2014-02-16 MED ORDER — ONDANSETRON HCL 4 MG PO TABS
4.0000 mg | ORAL_TABLET | Freq: Four times a day (QID) | ORAL | Status: DC | PRN
  Administered 2014-02-19: 4 mg via ORAL
  Filled 2014-02-16: qty 1

## 2014-02-16 MED ORDER — TRAMADOL HCL 50 MG PO TABS
50.0000 mg | ORAL_TABLET | Freq: Four times a day (QID) | ORAL | Status: DC | PRN
  Administered 2014-02-16 – 2014-02-19 (×5): 50 mg via ORAL
  Filled 2014-02-16 (×5): qty 1

## 2014-02-16 NOTE — ED Provider Notes (Signed)
CSN: [id number]     Arrival date & time 02/16/14  1117 History   First MD Initiated Contact with Patient 02/16/14 1138     Chief Complaint  Patient presents with  . Urinary Tract Infection     (Consider location/radiation/quality/duration/timing/severity/associated sxs/prior Treatment) HPI Level V caveat due to some dementia. Has been having diarrhea for the last few days. Increase in abdominal pain. She has had some chronic abdominal pain recently. Recently treated for UTI. Diarrhea preceded the UTI diagnosis. Sending by primary care doctor. Also his been having depression and anxiety attacks. Pending psychiatric evaluation. No fevers. Has had decreased oral intake since she is worried about her watery diarrhea. No dysuria. Has reportedly been having black stools now also. Dr Gerarda Fraction called after he saw her in the office today   Past Medical History  Diagnosis Date  . GERD (gastroesophageal reflux disease)   . Dyslipidemia   . Essential hypertension, benign   . Hypothyroidism   . IBS (irritable bowel syndrome)   . Diverticulitis   . Sinoatrial node dysfunction   . Chronic diastolic heart failure   . Pacemaker   . Anxiety   . Back pain   . Kyphosis   . Dementia    Past Surgical History  Procedure Laterality Date  . Cholecystectomy    . Appendectomy    . Colectomy      secondary to diverticulitis  . Ectopic pregnancy surgery    . Pacemaker placement    . Abdominal hysterectomy    . Colonoscopy  2003    Dr. Arnoldo Morale: few scattered diverticula in transverse and descending colon  . ?thyroid surgery?    . Colonoscopy N/A 10/17/2013    Dr. Gala Romney: Noncritical Schatzki ring, not manipulated, 3 cm hiatal hernia  . Esophagogastroduodenoscopy N/A 10/17/2013    Dr. Gala Romney: Pancolonic diverticulosis, splenic flexure and ileocecal valve polyps removed, tubular adenomas. Next TCS 5 years if health permits   Family History  Problem Relation Age of Onset  . Stroke Mother   . Emphysema Father    . Colon cancer Neg Hx    History  Substance Use Topics  . Smoking status: Former Smoker -- 0.50 packs/day for 45 years    Types: Cigarettes    Quit date: 02/24/1979  . Smokeless tobacco: Never Used  . Alcohol Use: No   OB History    Gravida Para Term Preterm AB TAB SAB Ectopic Multiple Living   3 3 3       3      Review of Systems  Unable to perform ROS     Allergies  Review of patient's allergies indicates no known allergies.  Home Medications   Prior to Admission medications   Medication Sig Start Date End Date Taking? Authorizing Provider  aspirin EC 81 MG tablet Take 81 mg by mouth every morning.    Yes Historical Provider, MD  atenolol (TENORMIN) 50 MG tablet Take 50 mg by mouth every morning.    Yes Historical Provider, MD  donepezil (ARICEPT) 5 MG tablet Take 5 mg by mouth every morning.    Yes Historical Provider, MD  furosemide (LASIX) 20 MG tablet Take 20 mg by mouth daily.   Yes Historical Provider, MD  levothyroxine (SYNTHROID, LEVOTHROID) 100 MCG tablet Take 100 mcg by mouth daily before breakfast.   Yes Historical Provider, MD  pantoprazole (PROTONIX) 40 MG tablet TAKE 1 TABLET BY MOUTH ONCE DAILY. 01/05/14  Yes Orvil Feil, NP  potassium chloride 20 MEQ/15ML (10%)  SOLN Take 60 mEq by mouth daily.   Yes Historical Provider, MD  potassium chloride SA (K-DUR,KLOR-CON) 20 MEQ tablet Take 60 mEq by mouth daily.   Yes Historical Provider, MD  risperiDONE (RISPERDAL) 0.25 MG tablet Take 0.25 mg by mouth daily.    Yes Historical Provider, MD  risperiDONE (RISPERDAL) 0.5 MG tablet Take 0.5 mg by mouth at bedtime.   Yes Historical Provider, MD  risperiDONE (RISPERDAL) 1 MG tablet Take 1 mg by mouth every 4 (four) hours as needed (agitation).   Yes Historical Provider, MD  sertraline (ZOLOFT) 50 MG tablet Take 50 mg by mouth every morning.    Yes Historical Provider, MD  acetaminophen (TYLENOL) 500 MG tablet Take 500 mg by mouth 4 (four) times daily as needed for  moderate pain (pain).     Historical Provider, MD  cephALEXin (KEFLEX) 500 MG capsule Take 1 capsule (500 mg total) by mouth 2 (two) times daily. Patient not taking: Reported on 02/16/2014 02/08/14   Sharyon Cable, MD  ondansetron (ZOFRAN) 4 MG tablet Take 1 tablet (4 mg total) by mouth every 6 (six) hours as needed for nausea or vomiting. 12/02/13   Mahala Menghini, PA-C   BP 91/58 mmHg  Pulse 59  Temp(Src) 97.7 F (36.5 C) (Rectal)  Resp 18  Ht 5\' 7"  (1.702 m)  Wt 148 lb (67.132 kg)  BMI 23.17 kg/m2  SpO2 96% Physical Exam  Constitutional: She appears well-developed and well-nourished.  HENT:  Head: Atraumatic.  Eyes: Pupils are equal, round, and reactive to light.  Neck: Neck supple.  Cardiovascular: Normal rate.   Pulmonary/Chest: Effort normal.  Abdominal:  Moderate lower abdominal tenderness without rebound or guarding. No hernias palpated  Musculoskeletal: She exhibits edema.  Mild bilateral lower extremity pitting edema  Neurological: She is alert.  Mild confusion, but overall appropriate  Skin: Skin is warm.    ED Course  Procedures (including critical care time) Labs Review Labs Reviewed  CBC WITH DIFFERENTIAL - Abnormal; Notable for the following:    Platelets 132 (*)    All other components within normal limits  COMPREHENSIVE METABOLIC PANEL - Abnormal; Notable for the following:    BUN 47 (*)    Creatinine, Ser 2.29 (*)    Total Protein 5.9 (*)    Albumin 3.4 (*)    GFR calc non Af Amer 19 (*)    GFR calc Af Amer 23 (*)    All other components within normal limits  URINALYSIS, ROUTINE W REFLEX MICROSCOPIC    Imaging Review Dg Abd Acute W/chest  02/16/2014   CLINICAL DATA:  Recently diagnosed urinary tract infection. Taking antibiotics but not eating and drinking much. Dr. also could not get blood pressure on patient. Son also says that pt is "depressed and wants to die" however denies SI. Son says he feels like pt is "going down hill." Pt c/o pain all  over. Pt unable to tolerate any repeats, all images done with pt in stretcher  EXAM: ACUTE ABDOMEN SERIES (ABDOMEN 2 VIEW & CHEST 1 VIEW)  COMPARISON:  02/08/2014  FINDINGS: Left-sided transvenous pacemaker leads overlie the right atrium and right ventricle. The heart size is accentuated by the patient position. Lungs are clear. No pulmonary edema.  Bowel gas pattern is nonobstructive. Ectatic versus aneurysmal abdominal aorta. The urinary bladder is probably distended, displacing bowel gas from the pelvis.  IMPRESSION: 1.  No evidence for acute cardiopulmonary abnormality. 2. Nonobstructive bowel gas pattern.  Distended urinary bladder.  Electronically Signed   By: Shon Hale M.D.   On: 02/16/2014 13:46     EKG Interpretation None      MDM   Final diagnoses:  Abdominal pain  Dehydration  Renal insufficiency    Patient with diarrhea and failure to thrive. Decreased oral intake. Seen by Dr. Nolon Rod into the ER for evaluation area has recently been on antibiotics for UTI. Her creatinine has increased. Has reportedly had decreased oral intake which may also be due to some depression. Not managing at home and will be admitted to internal medicine.    Jasper Riling. Alvino Chapel, MD 02/16/14 [phone]

## 2014-02-16 NOTE — H&P (Addendum)
[facility] History and Physical  CALISHA TINDEL OEV:[id number] DOB: 02-03-1935 DOA: 02/16/2014  Referring physician: ER PCP: Glo Herring., MD   Chief Complaint: Weakness, diarrhea.  HPI: Kari Hall is a 80 y.o. female  This is a 80 year old lady who has had diarrhea for the last week or so. She also complains of abdominal pain which is somewhat chronic and has been investigated previously by gastroenterology. She was recently treated for UTI. She also has a history of severe anxiety with depression. She has a decreased oral intake for the last few days. There is no dysuria. She apparently has had black stools but there was no clear evidence of this in the emergency room. There is been no fever. There is been no vomiting. Dementia is listed as a diagnosis and history may not be entirely clear because of this. Evaluation in the emergency room shows acute on chronic renal failure and she is being admitted for further management.   Review of Systems:  Apart from symptoms above, all other systems are negative.  Past Medical History  Diagnosis Date  . GERD (gastroesophageal reflux disease)   . Dyslipidemia   . Essential hypertension, benign   . Hypothyroidism   . IBS (irritable bowel syndrome)   . Diverticulitis   . Sinoatrial node dysfunction   . Chronic diastolic heart failure   . Pacemaker   . Anxiety   . Back pain   . Kyphosis   . Dementia    Past Surgical History  Procedure Laterality Date  . Cholecystectomy    . Appendectomy    . Colectomy      secondary to diverticulitis  . Ectopic pregnancy surgery    . Pacemaker placement    . Abdominal hysterectomy    . Colonoscopy  2003    Dr. Arnoldo Morale: few scattered diverticula in transverse and descending colon  . ?thyroid surgery?    . Colonoscopy N/A 10/17/2013    Dr. Gala Romney: Noncritical Schatzki ring, not manipulated, 3 cm hiatal hernia  . Esophagogastroduodenoscopy N/A 10/17/2013    Dr. Gala Romney: Pancolonic diverticulosis,  splenic flexure and ileocecal valve polyps removed, tubular adenomas. Next TCS 5 years if health permits   Social History:  reports that she quit smoking about 35 years ago. Her smoking use included Cigarettes. She has a 22.5 pack-year smoking history. She has never used smokeless tobacco. She reports that she does not drink alcohol or use illicit drugs.  No Known Allergies  Family History  Problem Relation Age of Onset  . Stroke Mother   . Emphysema Father   . Colon cancer Neg Hx      Prior to Admission medications   Medication Sig Start Date End Date Taking? Authorizing Provider  aspirin EC 81 MG tablet Take 81 mg by mouth every morning.    Yes Historical Provider, MD  atenolol (TENORMIN) 50 MG tablet Take 50 mg by mouth every morning.    Yes Historical Provider, MD  donepezil (ARICEPT) 5 MG tablet Take 5 mg by mouth every morning.    Yes Historical Provider, MD  furosemide (LASIX) 20 MG tablet Take 20 mg by mouth daily.   Yes Historical Provider, MD  levothyroxine (SYNTHROID, LEVOTHROID) 100 MCG tablet Take 100 mcg by mouth daily before breakfast.   Yes Historical Provider, MD  pantoprazole (PROTONIX) 40 MG tablet TAKE 1 TABLET BY MOUTH ONCE DAILY. 01/05/14  Yes Orvil Feil, NP  potassium chloride 20 MEQ/15ML (10%) SOLN Take 60 mEq by mouth daily.  Yes Historical Provider, MD  potassium chloride SA (K-DUR,KLOR-CON) 20 MEQ tablet Take 60 mEq by mouth daily.   Yes Historical Provider, MD  risperiDONE (RISPERDAL) 0.25 MG tablet Take 0.25 mg by mouth daily.    Yes Historical Provider, MD  risperiDONE (RISPERDAL) 0.5 MG tablet Take 0.5 mg by mouth at bedtime.   Yes Historical Provider, MD  risperiDONE (RISPERDAL) 1 MG tablet Take 1 mg by mouth every 4 (four) hours as needed (agitation).   Yes Historical Provider, MD  sertraline (ZOLOFT) 50 MG tablet Take 50 mg by mouth every morning.    Yes Historical Provider, MD  acetaminophen (TYLENOL) 500 MG tablet Take 500 mg by mouth 4 (four) times  daily as needed for moderate pain (pain).     Historical Provider, MD  cephALEXin (KEFLEX) 500 MG capsule Take 1 capsule (500 mg total) by mouth 2 (two) times daily. Patient not taking: Reported on 02/16/2014 02/08/14   Sharyon Cable, MD  ondansetron (ZOFRAN) 4 MG tablet Take 1 tablet (4 mg total) by mouth every 6 (six) hours as needed for nausea or vomiting. 12/02/13   Mahala Menghini, PA-C   Physical Exam: Filed Vitals:   02/16/14 1402 02/16/14 1403 02/16/14 1430 02/16/14 1539  BP: 108/74  91/58 108/64  Pulse:  64 59 63  Temp:      TempSrc:      Resp:    18  Height:      Weight:      SpO2:  97% 96% 97%    Wt Readings from Last 3 Encounters:  02/16/14 67.132 kg (148 lb)  02/08/14 68.947 kg (152 lb)  01/26/14 68.947 kg (152 lb)    General:  Appears anxious. She looks clinically dehydrated. She is hemodynamic stable although there was a blood pressure reading that was systolic below 027. She has had IV fluids in the emergency room. Eyes: PERRL, normal lids, irises & conjunctiva ENT: grossly normal hearing, lips & tongue Neck: no LAD, masses or thyromegaly Cardiovascular: RRR, no m/r/g. No LE edema. Telemetry: SR, no arrhythmias  Respiratory: CTA bilaterally, no w/r/r. Normal respiratory effort. Abdomen: soft, ntnd Skin: no rash or induration seen on limited exam Musculoskeletal: grossly normal tone BUE/BLE Psychiatric: Anxious. Neurologic: grossly non-focal. appears to be orientated in time, place and person.           Labs on Admission:  Basic Metabolic Panel:  Recent Labs Lab 02/16/14 1235  NA 135  K 3.8  CL 101  CO2 25  GLUCOSE 97  BUN 47*  CREATININE 2.29*  CALCIUM 8.9   Liver Function Tests:  Recent Labs Lab 02/16/14 1235  AST 22  ALT 18  ALKPHOS 66  BILITOT 0.9  PROT 5.9*  ALBUMIN 3.4*   No results for input(s): LIPASE, AMYLASE in the last 168 hours. No results for input(s): AMMONIA in the last 168 hours. CBC:  Recent Labs Lab  02/16/14 1208  WBC 8.1  NEUTROABS 5.9  HGB 14.3  HCT 43.6  MCV 97.1  PLT 132*   Cardiac Enzymes: No results for input(s): CKTOTAL, CKMB, CKMBINDEX, TROPONINI in the last 168 hours.  BNP (last 3 results)  Recent Labs  09/07/13 0850 09/08/13 0506 09/08/13 1935  PROBNP 180.7 226.7 278.3   CBG: No results for input(s): GLUCAP in the last 168 hours.  Radiological Exams on Admission: Dg Abd Acute W/chest  02/16/2014   CLINICAL DATA:  Recently diagnosed urinary tract infection. Taking antibiotics but not eating and drinking much. Dr.  also could not get blood pressure on patient. Son also says that pt is "depressed and wants to die" however denies SI. Son says he feels like pt is "going down hill." Pt c/o pain all over. Pt unable to tolerate any repeats, all images done with pt in stretcher  EXAM: ACUTE ABDOMEN SERIES (ABDOMEN 2 VIEW & CHEST 1 VIEW)  COMPARISON:  02/08/2014  FINDINGS: Left-sided transvenous pacemaker leads overlie the right atrium and right ventricle. The heart size is accentuated by the patient position. Lungs are clear. No pulmonary edema.  Bowel gas pattern is nonobstructive. Ectatic versus aneurysmal abdominal aorta. The urinary bladder is probably distended, displacing bowel gas from the pelvis.  IMPRESSION: 1.  No evidence for acute cardiopulmonary abnormality. 2. Nonobstructive bowel gas pattern.  Distended urinary bladder.   Electronically Signed   By: Shon Hale M.D.   On: 02/16/2014 13:46      Assessment/Plan   1. Acute on chronic renal failure secondary to dehydration from diarrhea and poor by mouth intake. We will give her intravenous fluids. She does have a history of chronic diastolic congestive heart failure so we will need to watch that we do not fluid overload her. Her baseline creatinine is probably in the 1.5 range. Diuretics will be discontinued for the time being. We will admitted to the medical floor. 2. Diarrhea-monitor closely.May need GI  studies/input. 3. History of chronic diastolic congestive heart failure-she is currently hypovolemic clinically with dehydration but will need to watch for overload as we rehydrate her. 4. Anxiety with depression-I believe this may well be a major component of her symptomatology, this is being investigated with psychiatry as an outpatient. 5. Status post pacemaker in situ. 6. Hypertension-borderline hypotensive currently.  Further recommendations will depend on patient's hospital progress.   Code Status: Full code  DVT Prophylaxis: Lovenox  Family Communication: I discussed the plan with the patient at the bedside.   Disposition Plan: Depending on progress.  Time spent: 60 minutes.  Doree Albee [facility] Pager [phone].

## 2014-02-16 NOTE — ED Notes (Signed)
Pt's son reports pt was here recently and diagnosed with UTI.  REports has been taking antibiotics but not eating or drinking much.  Went to Dr. Nolon Rod office today for follow up and was told that Dr. Gerarda Fraction couldn't get a bp on pt.  Family reports Dr. Gerarda Fraction instructed them to bring pt here for evaluation.  Son also says that pt is "depressed and wants to die" however denies SI.  Son says he feels like pt is "going down hill."  Pt c/o pain all over.

## 2014-02-16 NOTE — ED Notes (Signed)
Pt's son Gretta Samons can be reached at [phone] when pt is discharged.

## 2014-02-16 NOTE — ED Notes (Signed)
Deanna Mabe NT and Alecia Lemming EMT - in/out cath

## 2014-02-17 DIAGNOSIS — F03918 Unspecified dementia, unspecified severity, with other behavioral disturbance: Secondary | ICD-10-CM | POA: Diagnosis present

## 2014-02-17 DIAGNOSIS — F0391 Unspecified dementia with behavioral disturbance: Secondary | ICD-10-CM | POA: Diagnosis present

## 2014-02-17 DIAGNOSIS — N183 Chronic kidney disease, stage 3 unspecified: Secondary | ICD-10-CM | POA: Diagnosis present

## 2014-02-17 DIAGNOSIS — E43 Unspecified severe protein-calorie malnutrition: Secondary | ICD-10-CM | POA: Diagnosis present

## 2014-02-17 DIAGNOSIS — R197 Diarrhea, unspecified: Secondary | ICD-10-CM | POA: Diagnosis present

## 2014-02-17 DIAGNOSIS — L89222 Pressure ulcer of left hip, stage 2: Secondary | ICD-10-CM

## 2014-02-17 DIAGNOSIS — L89202 Pressure ulcer of unspecified hip, stage 2: Secondary | ICD-10-CM | POA: Diagnosis present

## 2014-02-17 DIAGNOSIS — E039 Hypothyroidism, unspecified: Secondary | ICD-10-CM | POA: Diagnosis present

## 2014-02-17 DIAGNOSIS — Z950 Presence of cardiac pacemaker: Secondary | ICD-10-CM

## 2014-02-17 LAB — COMPREHENSIVE METABOLIC PANEL
ALK PHOS: 64 U/L (ref 39–117)
ALT: 16 U/L (ref 0–35)
AST: 19 U/L (ref 0–37)
Albumin: 3.2 g/dL — ABNORMAL LOW (ref 3.5–5.2)
Anion gap: 12 (ref 5–15)
BUN: 46 mg/dL — AB (ref 6–23)
CHLORIDE: 105 meq/L (ref 96–112)
CO2: 22 mmol/L (ref 19–32)
CREATININE: 2.02 mg/dL — AB (ref 0.50–1.10)
Calcium: 8.7 mg/dL (ref 8.4–10.5)
GFR calc Af Amer: 26 mL/min — ABNORMAL LOW (ref >90)
GFR, EST NON AFRICAN AMERICAN: 23 mL/min — AB (ref >90)
GLUCOSE: 79 mg/dL (ref 70–99)
POTASSIUM: 3.9 mmol/L (ref 3.5–5.1)
Sodium: 139 mmol/L (ref 135–145)
Total Bilirubin: 0.8 mg/dL (ref 0.3–1.2)
Total Protein: 5.5 g/dL — ABNORMAL LOW (ref 6.0–8.3)

## 2014-02-17 LAB — CBC
HCT: 37.6 % (ref 36.0–46.0)
Hemoglobin: 12.5 g/dL (ref 12.0–15.0)
MCH: 32.7 pg (ref 26.0–34.0)
MCHC: 33.2 g/dL (ref 30.0–36.0)
MCV: 98.4 fL (ref 78.0–100.0)
PLATELETS: 127 10*3/uL — AB (ref 150–400)
RBC: 3.82 MIL/uL — AB (ref 3.87–5.11)
RDW: 13.8 % (ref 11.5–15.5)
WBC: 4.9 10*3/uL (ref 4.0–10.5)

## 2014-02-17 LAB — MRSA PCR SCREENING: MRSA BY PCR: NEGATIVE

## 2014-02-17 LAB — BRAIN NATRIURETIC PEPTIDE: B Natriuretic Peptide: 112 pg/mL — ABNORMAL HIGH (ref 0.0–100.0)

## 2014-02-17 MED ORDER — LOPERAMIDE HCL 2 MG PO CAPS
4.0000 mg | ORAL_CAPSULE | Freq: Once | ORAL | Status: AC
  Administered 2014-02-17: 4 mg via ORAL
  Filled 2014-02-17: qty 2

## 2014-02-17 MED ORDER — BOOST / RESOURCE BREEZE PO LIQD
1.0000 | Freq: Three times a day (TID) | ORAL | Status: DC
  Administered 2014-02-17 – 2014-02-19 (×5): 1 via ORAL

## 2014-02-17 MED ORDER — LOPERAMIDE HCL 2 MG PO CAPS: 2.0000 mg | ORAL_CAPSULE | ORAL | Status: DC | PRN

## 2014-02-17 MED ORDER — ACETAMINOPHEN 500 MG PO TABS
500.0000 mg | ORAL_TABLET | Freq: Four times a day (QID) | ORAL | Status: DC | PRN
  Administered 2014-02-17 – 2014-02-18 (×3): 500 mg via ORAL
  Filled 2014-02-17 (×3): qty 1

## 2014-02-17 NOTE — Clinical Social Work Psychosocial (Signed)
Clinical Social Work Department BRIEF PSYCHOSOCIAL ASSESSMENT 02/17/2014  Patient:  Kari Hall, Kari Hall     Account Number:  [id number]     Admit date:  02/16/2014  Clinical Social Worker:  Legrand Como  Date/Time:  02/17/2014 12:51 PM  Referred by:  CSW  Date Referred:  02/17/2014 Referred for  SNF Placement   Other Referral:   Interview type:  Patient Other interview type:   Patient  Daughter, [name]    PSYCHOSOCIAL DATA Living Status:  FACILITY Admitted from facility:   Level of care:  Assisted Living Primary support name:  Izola Price Primary support relationship to patient:  CHILD, ADULT Degree of support available:   Family is very supportive    CURRENT CONCERNS Current Concerns  Post-Acute Placement   Other Concerns:    SOCIAL WORK ASSESSMENT / PLAN CSW met with patient who was oriented to self, place and situation. She did not know today's date. Patient's daughter, Izola Price, was with patient during assessment. Patient was minimally verbal and Mrs. Walker provider most of the history.  Mrs. Gilford Rile indicated that patient was placed in [facility] on 11/04/13. Ms. Gilford Rile indicated that it felt like she had been there for a longer period of time.  She stated that patient's son owns the facility.  Ms. Gilford Rile indicated that prior to that patient resided alone, she indicated that she lived beside patient during this time and checked on her daily.  She stated that patient was ambulating unassisted and managing most of her ADLs prior to her placement.  CSW spoke with the family about the recommendation for patient to receive Hospice Care.  Ms. Gilford Rile indicated that the family desired to have [facility] provided in the facility where patient currently resides.  Patient and family indicated that they wanted to work with [facility].  Patient indicated that she desired this as well.   Assessment/plan status:   Other assessment/ plan:    Information/referral to community resources:    PATIENT'S/FAMILY'S RESPONSE TO PLAN OF CARE: Patient and Family agreeable for patient to return to [facility] with Hospice Care at the faciliity.   Ambrose Pancoast, [place]

## 2014-02-17 NOTE — Care Management Note (Addendum)
    Page 1 of 1   02/19/2014     3:11:48 PM CARE MANAGEMENT NOTE 02/19/2014  Patient:  [place], [place]   Account Number:  [id number]  Date Initiated:  02/17/2014  Documentation initiated by:  Jolene Provost  Subjective/Objective Assessment:   Pt admitted with AKI. Pt is from [facility]. Pt plans to dishcarge back to facility with Hospice. Pt's family requests [facility] be used. Hospice called for referral and info faxed to [facility].     Action/Plan:   Gregary Signs will contact family and I will call Gregary Signs to notify her when pt is discharged.   Anticipated DC Date:  02/18/2014   Anticipated DC Plan:  [place]  In-house referral  Clinical Social Worker      DC Planning Services  CM consult      PAC Choice  HOSPICE   Choice offered to / List presented to:  C-4 Adult Children           [place] agency  [facility]   Status of service:  Completed, signed off Medicare Important Message given?  YES (If response is "NO", the following Medicare IM given date fields will be blank) Date Medicare IM given:  02/19/2014 Medicare IM given by:  Jolene Provost Date Additional Medicare IM given:   Additional Medicare IM given by:    Discharge Disposition:  [place]  Per UR Regulation:    If discussed at Long Length of Stay Meetings, dates discussed:    Comments:  02/19/2014 [place], RN, MSN, PCCN Pt discharging back to ALF with hospice services, CSW has arranged for return to ALF. [facility], of [facility], has been notified for dishcarge and faxd discharge summary. Hospice RN will see pt the morning of 02/20/2014. No further CM needs at this time.  02/17/2014 [place], RN, MSN, [employer]

## 2014-02-17 NOTE — Progress Notes (Signed)
INITIAL NUTRITION ASSESSMENT  DOCUMENTATION CODES Per approved criteria  -Severe malnutrition in the context of chronic illness   Pt meets criteria for severe MALNUTRITION in the context of chronic illness as evidenced by 10.4% wt loss x 3 months, moderate to severe fat and muscle depletion.   INTERVENTION: Resource Breeze po TID, each supplement provides 250 kcal and 9 grams of protein  NUTRITION DIAGNOSIS: Malnutrition related to decreased oral intake as evidenced by 10.4% wt loss x 3 months, moderate to severe fat and muscle depletion.   Goal: Pt will meet >90% of estimated nutritional needs  Monitor:  Diet advancement, PO/supplement intake, labs, weight changes, I/O's   Reason for Assessment: Consult to assess needs  80 y.o. female  Admitting Dx: <principal problem not specified>  This is a 80 year old lady who has had diarrhea for the last week or so. She also complains of abdominal pain which is somewhat chronic and has been investigated previously by gastroenterology. She was recently treated for UTI. She also has a history of severe anxiety with depression. She has a decreased oral intake for the last few days. There is no dysuria. She apparently has had black stools but there was no clear evidence of this in the emergency room. There is been no fever. There is been no vomiting. Dementia is listed as a diagnosis and history may not be entirely clear because of this. Evaluation in the emergency room shows acute on chronic renal failure and she is being admitted for further management.  ASSESSMENT: Pt admitted with AKI from [facility]. Hx obtained from pt, pt daughter, and pt son-in-law at bedside.  Pt reports poor appetite PTA. She reveals that she stopped eating because she was concerned about putting on weight. Pt daughter reports that pt is able to feed herself and often eats in the dining room. Both deny any difficulties chewing or swallowing food. No PO  intake currently available. Family reports they are unsure how much pt ate at breakfast, however, pt reports good intake.  Weight loss has been ongoing; pt and family have confirmed that it has been ongoing since September. They report UBW of around 165#, which is not consistent with weight hx in medical record. Noted a 10.4% wt loss over the past 3 months, per documented wt hx.  Educated pt on nutritional adequacy for health maintenance. Discussed importance of good PO intake to promote healing process. Pt reports she "really enjoys the taste of food", but is also agreeable supplements to help maximize nutritional intake.  Plans are to discharge back to facility with hospice, per [facility].  Labs reviewed. BUN/Creat: 46/2.02.  Nutrition Focused Physical Exam:  Subcutaneous Fat:  Orbital Region: severe depletion Upper Arm Region: moderate depletion Thoracic and Lumbar Region: moderate depletion  Muscle:  Temple Region: moderate depletion Clavicle Bone Region: severe depletion Clavicle and Acromion Bone Region: severe depletion Scapular Bone Region: severe depletion Dorsal Hand: severe depletion Patellar Region: severe depletion Anterior Thigh Region: moderate depletion Posterior Calf Region: moderate depletion  Edema: none present  Height: Ht Readings from Last 1 Encounters:  02/16/14 5\' 7"  (1.702 m)    Weight: Wt Readings from Last 1 Encounters:  02/16/14 145 lb 15.1 oz (66.2 kg)    Ideal Body Weight: 135#  % Ideal Body Weight: 107%  Wt Readings from Last 25 Encounters:  02/16/14 145 lb 15.1 oz (66.2 kg)  02/08/14 152 lb (68.947 kg)  01/26/14 152 lb (68.947 kg)  01/19/14 153 lb 9.6 oz (  69.673 kg)  12/02/13 139 lb 1.8 oz (63.1 kg)  12/02/13 162 lb (73.483 kg)  10/21/13 180 lb (81.647 kg)  10/20/13 180 lb (81.647 kg)  10/16/13 194 lb (87.998 kg)  10/01/13 194 lb (87.998 kg)  09/11/13 188 lb (85.276 kg)  09/08/13 180 lb (81.647 kg)  09/08/13 188 lb (85.276 kg)  09/07/13  184 lb (83.462 kg)  08/04/13 189 lb (85.73 kg)  07/09/13 188 lb (85.276 kg)  06/09/13 190 lb (86.183 kg)  05/02/13 194 lb 12.8 oz (88.361 kg)  03/14/13 191 lb (86.637 kg)  02/28/13 193 lb (87.544 kg)  05/06/12 190 lb 6.4 oz (86.365 kg)  04/08/12 189 lb 1.9 oz (85.784 kg)  10/09/11 191 lb (86.637 kg)  09/13/11 187 lb 0.6 oz (84.841 kg)  07/02/11 180 lb (81.647 kg)   Usual Body Weight: 185#  % Usual Body Weight: 78%  BMI:  Body mass index is 22.85 kg/(m^2). Normal weight range  Estimated Nutritional Needs: Kcal: 1700-1900 Protein: 72-82 grams Fluid: 1.7-1.9 L  Skin: Intact  Diet Order: Diet clear liquid  EDUCATION NEEDS: -Education needs addressed   Intake/Output Summary (Last 24 hours) at 02/17/14 0940 Last data filed at 02/17/14 0509  Gross per 24 hour  Intake   1085 ml  Output      0 ml  Net   1085 ml    Last BM: 02/16/14  Labs:   Recent Labs Lab 02/16/14 1235 02/17/14 0547  NA 135 139  K 3.8 3.9  CL 101 105  CO2 25 22  BUN 47* 46*  CREATININE 2.29* 2.02*  CALCIUM 8.9 8.7  GLUCOSE 97 79    CBG (last 3)  No results for input(s): GLUCAP in the last 72 hours.  Scheduled Meds: . aspirin EC  81 mg Oral q morning - 10a  . atenolol  50 mg Oral q morning - 10a  . donepezil  5 mg Oral q morning - 10a  . enoxaparin (LOVENOX) injection  30 mg Subcutaneous Q24H  . levothyroxine  100 mcg Oral QAC breakfast  . pantoprazole  40 mg Oral Daily  . risperiDONE  0.25 mg Oral Daily  . risperiDONE  0.5 mg Oral QHS  . sertraline  50 mg Oral q morning - 10a    Continuous Infusions: . sodium chloride 100 mL/hr at 02/17/14 4650    Past Medical History  Diagnosis Date  . GERD (gastroesophageal reflux disease)   . Dyslipidemia   . Essential hypertension, benign   . Hypothyroidism   . IBS (irritable bowel syndrome)   . Diverticulitis   . Sinoatrial node dysfunction   . Chronic diastolic heart failure   . Pacemaker   . Anxiety   . Back pain   . Kyphosis    . Dementia     Past Surgical History  Procedure Laterality Date  . Cholecystectomy    . Appendectomy    . Colectomy      secondary to diverticulitis  . Ectopic pregnancy surgery    . Pacemaker placement    . Abdominal hysterectomy    . Colonoscopy  2003    Dr. Arnoldo Morale: few scattered diverticula in transverse and descending colon  . ?thyroid surgery?    . Colonoscopy N/A 10/17/2013    Dr. Gala Romney: Noncritical Schatzki ring, not manipulated, 3 cm hiatal hernia  . Esophagogastroduodenoscopy N/A 10/17/2013    Dr. Gala Romney: Pancolonic diverticulosis, splenic flexure and ileocecal valve polyps removed, tubular adenomas. Next TCS 5 years if health permits  Sharae Zappulla A. Jimmye [facility], RD, LDN, CDE Pager: [phone]

## 2014-02-17 NOTE — Progress Notes (Signed)
PROGRESS NOTE  Kari Hall JGG:[id number] DOB: 26-Mar-1934 DOA: 02/16/2014 PCP: Glo Herring., MD  HPI/Recap of past 44 hours: 80 year old female with past mental history of chronic diastolic heart failure, dementia, stage III chronic kidney disease and who has been an overall decline in regards to mobility and eating status for the past several months was brought in on 1/4 by her daughter as patient has had very poor by mouth intake, and has had diarrhea for the past few weeks.  In the emergency room, patient found to have acute on chronic renal failure with a creatinine of 1.3 (baseline about 1.7), and albumin of 2.8. Patient admitted to the hospitalist service.  Assessment/Plan: Active Problems:   Essential hypertension: Continue home medications   Cardiac pacemaker in situ   Chronic diastolic congestive heart failure: Following daily weights. Patient does not look volume overloaded at this time.    Acute renal failure in the setting of stage III chronic kidney disease: Suspect secondary to poor by mouth intake and GI losses from diarrhea. With hydration, creatinine improving, currently down to 2. Watching given heart failure for volume overload.    Protein-calorie malnutrition, severe: Patient was criteria for severe malnutrition in the context of chronic illness as evidenced by 10.4% weight loss 3 months, moderate to severe fat muscle depletion. They're recommending resource breeze by mouth 3 times a day, however the difficulty may be getting patient to want to eat.    Dementia with behavioral disturbance with overall cognitive decline and some components of depression and anxiety.: Upon my examination, patient with flattened affect, very poor short-term memory. On Aricept and Zoloft currently. Risperdal had recently been stopped. I had an extensive discussion with the patient's daughter as well as son-in-law. Explained that while we may be able to treat certain things acutely like the  infection, what concerns me is her more withdrawn affect as well as her memory loss. They also explained that in the past month, she has had a great decline and is getting more and more bed bound. In fact, patient has a small left hip decubitus ulcer from being bedbound. After extensive discussion, we are referring as per their request a hospice as a suspect with patient's advancing dementia and poor by mouth intake with protein calorie malnutrition and increasing bedbound status, she will not survive the next 6 months    Diarrhea in adult patient: Does not look to be infectious. Review of her medications, I do not think that she is hyper thyroid as TSH is normal. She is on Zoloft and Aricept, both of which have side effects of diarrhea. Stool cultures preliminary negative. We'll discuss with family and try to treat for now with when necessary Imodium. As patient trend his to hospice, may be able to stop one or both of these medications.  Left hip decubitus ulcer: Occurred prior to admission, having wound care see for recommendations.  Hypothyroidism: Currently TSH within normal limits   Code Status: Full code, although patient would likely benefit from DO NOT RESUSCITATE status, which we can follow-up on prior to discharge  Family Communication: Discussed with patient's daughter and husband at the bedside  Disposition Plan: Looking at hospice services at patient's facility   Consultants:  None  Procedures:  None  Antibiotics:  None, patient has diarrhea appears noninfectious, no white count and no fever   Objective: BP 97/52 mmHg  Pulse 57  Temp(Src) 97.4 F (36.3 C) (Oral)  Resp 18  Ht 5\' 7"  (1.702 m)  Wt 66.2 kg (145 lb 15.1 oz)  BMI 22.85 kg/m2  SpO2 96%  Intake/Output Summary (Last 24 hours) at 02/17/14 1552 Last data filed at 02/17/14 1300  Gross per 24 hour  Intake   1325 ml  Output      0 ml  Net   1325 ml   Filed Weights   02/16/14 1122 02/16/14 1629  Weight:  67.132 kg (148 lb) 66.2 kg (145 lb 15.1 oz)    Exam:   General:  Flattened affect, somewhat interactive, oriented 1  Cardiovascular: Regular rate and rhythm, O9-G2, 2/6 systolic ejection murmur  Respiratory: Clear to auscultation bilaterally  Abdomen: Soft, nonspecific tenderness, hypoactive bowel sounds  Musculoskeletal: No clubbing or cyanosis or edema, noted small 1-2 centimeter stage II decubitus ulcer on left hip  Data Reviewed: Basic Metabolic Panel:  Recent Labs Lab 02/16/14 1235 02/17/14 0547  NA 135 139  K 3.8 3.9  CL 101 105  CO2 25 22  GLUCOSE 97 79  BUN 47* 46*  CREATININE 2.29* 2.02*  CALCIUM 8.9 8.7   Liver Function Tests:  Recent Labs Lab 02/16/14 1235 02/17/14 0547  AST 22 19  ALT 18 16  ALKPHOS 66 64  BILITOT 0.9 0.8  PROT 5.9* 5.5*  ALBUMIN 3.4* 3.2*   No results for input(s): LIPASE, AMYLASE in the last 168 hours. No results for input(s): AMMONIA in the last 168 hours. CBC:  Recent Labs Lab 02/16/14 1208 02/17/14 0547  WBC 8.1 4.9  NEUTROABS 5.9  --   HGB 14.3 12.5  HCT 43.6 37.6  MCV 97.1 98.4  PLT 132* 127*   Cardiac Enzymes:   No results for input(s): CKTOTAL, CKMB, CKMBINDEX, TROPONINI in the last 168 hours. BNP (last 3 results)  Recent Labs  09/07/13 0850 09/08/13 0506 09/08/13 1935  PROBNP 180.7 226.7 278.3   CBG: No results for input(s): GLUCAP in the last 168 hours.  Recent Results (from the past 240 hour(s))  Urine culture     Status: None   Collection Time: 02/08/14 11:20 AM  Result Value Ref Range Status   Specimen Description URINE, CLEAN CATCH  Final   Special Requests NONE  Final   Colony Count   Final    60,000 COLONIES/ML Performed at [facility]    Culture   Final    Multiple bacterial morphotypes present, none predominant. Suggest appropriate recollection if clinically indicated. Performed at [facility]    Report Status 02/09/2014 FINAL  Final  MRSA PCR Screening      Status: None   Collection Time: 02/17/14 10:23 AM  Result Value Ref Range Status   MRSA by PCR NEGATIVE NEGATIVE Final    Comment:        The GeneXpert MRSA Assay (FDA approved for NASAL specimens only), is one component of a comprehensive MRSA colonization surveillance program. It is not intended to diagnose MRSA infection nor to guide or monitor treatment for MRSA infections.      Studies: Dg Abd Acute W/chest  02/16/2014   CLINICAL DATA:  Recently diagnosed urinary tract infection. Taking antibiotics but not eating and drinking much. Dr. also could not get blood pressure on patient. Son also says that pt is "depressed and wants to die" however denies SI. Son says he feels like pt is "going down hill." Pt c/o pain all over. Pt unable to tolerate any repeats, all images done with pt in stretcher  EXAM: ACUTE ABDOMEN SERIES (ABDOMEN 2 VIEW & CHEST 1 VIEW)  COMPARISON:  02/08/2014  FINDINGS: Left-sided transvenous pacemaker leads overlie the right atrium and right ventricle. The heart size is accentuated by the patient position. Lungs are clear. No pulmonary edema.  Bowel gas pattern is nonobstructive. Ectatic versus aneurysmal abdominal aorta. The urinary bladder is probably distended, displacing bowel gas from the pelvis.  IMPRESSION: 1.  No evidence for acute cardiopulmonary abnormality. 2. Nonobstructive bowel gas pattern.  Distended urinary bladder.   Electronically Signed   By: Shon Hale M.D.   On: 02/16/2014 13:46    Scheduled Meds: . aspirin EC  81 mg Oral q morning - 10a  . atenolol  50 mg Oral q morning - 10a  . donepezil  5 mg Oral q morning - 10a  . enoxaparin (LOVENOX) injection  30 mg Subcutaneous Q24H  . feeding supplement (RESOURCE BREEZE)  1 Container Oral TID BM  . levothyroxine  100 mcg Oral QAC breakfast  . pantoprazole  40 mg Oral Daily  . sertraline  50 mg Oral q morning - 10a    Continuous Infusions: . sodium chloride 75 mL/hr at 02/17/14 1409     Time  spent: 35 kinutes  [facility] Pager [phone]. If 7PM-7AM, please contact night-coverage at [email or web address], password [facility] 02/17/2014, 3:52 PM  LOS: 1 day

## 2014-02-17 NOTE — Care Management Utilization Note (Signed)
UR completed 

## 2014-02-18 ENCOUNTER — Inpatient Hospital Stay (HOSPITAL_COMMUNITY): Payer: Commercial Managed Care - HMO

## 2014-02-18 DIAGNOSIS — F0391 Unspecified dementia with behavioral disturbance: Secondary | ICD-10-CM

## 2014-02-18 DIAGNOSIS — K529 Noninfective gastroenteritis and colitis, unspecified: Secondary | ICD-10-CM

## 2014-02-18 DIAGNOSIS — R339 Retention of urine, unspecified: Secondary | ICD-10-CM

## 2014-02-18 DIAGNOSIS — F411 Generalized anxiety disorder: Secondary | ICD-10-CM | POA: Diagnosis not present

## 2014-02-18 LAB — URINALYSIS, ROUTINE W REFLEX MICROSCOPIC
Bilirubin Urine: NEGATIVE
Glucose, UA: NEGATIVE mg/dL
Hgb urine dipstick: NEGATIVE
KETONES UR: NEGATIVE mg/dL
Leukocytes, UA: NEGATIVE
NITRITE: NEGATIVE
Protein, ur: NEGATIVE mg/dL
Specific Gravity, Urine: 1.02 (ref 1.005–1.030)
UROBILINOGEN UA: 0.2 mg/dL (ref 0.0–1.0)
pH: 5 (ref 5.0–8.0)

## 2014-02-18 LAB — VITAMIN B12: VITAMIN B 12: 385 pg/mL (ref 211–911)

## 2014-02-18 LAB — BASIC METABOLIC PANEL
Anion gap: 6 (ref 5–15)
BUN: 33 mg/dL — ABNORMAL HIGH (ref 6–23)
CHLORIDE: 109 meq/L (ref 96–112)
CO2: 24 mmol/L (ref 19–32)
CREATININE: 1.29 mg/dL — AB (ref 0.50–1.10)
Calcium: 8.6 mg/dL (ref 8.4–10.5)
GFR, EST AFRICAN AMERICAN: 45 mL/min — AB (ref >90)
GFR, EST NON AFRICAN AMERICAN: 39 mL/min — AB (ref >90)
Glucose, Bld: 81 mg/dL (ref 70–99)
Potassium: 3.7 mmol/L (ref 3.5–5.1)
SODIUM: 139 mmol/L (ref 135–145)

## 2014-02-18 MED ORDER — ALPRAZOLAM 0.25 MG PO TABS
0.2500 mg | ORAL_TABLET | Freq: Four times a day (QID) | ORAL | Status: DC | PRN
  Administered 2014-02-18 – 2014-02-19 (×4): 0.25 mg via ORAL
  Filled 2014-02-18 (×4): qty 1

## 2014-02-18 MED ORDER — ENOXAPARIN SODIUM 40 MG/0.4ML ~~LOC~~ SOLN
40.0000 mg | SUBCUTANEOUS | Status: DC
  Administered 2014-02-18: 40 mg via SUBCUTANEOUS
  Filled 2014-02-18: qty 0.4

## 2014-02-18 NOTE — Progress Notes (Signed)
Patient c/o difficulty voiding.  Bladder scan patient, and it showed greater than 999. Attempted I & O cath, had difficulty with catheterization.  Notified daytime rn, she will attempt I&O cath.

## 2014-02-18 NOTE — Progress Notes (Signed)
Wound to L hip cleansed with NS and patted dry.  Foam dressing applied.  Patient turned on R hip at this time.  Very scant amount of serous drainage noted on wound.  Surrounding skin blanchable and intact.

## 2014-02-18 NOTE — Progress Notes (Signed)
[facility] PROGRESS NOTE  Kari Hall WCB:[id number] DOB: 1934/12/25 DOA: 02/16/2014 PCP: Glo Herring., MD    Code Status: Full code Family Communication: Family not available Disposition Plan: Discharge back to home facility when medically appropriate.   Consultants:  None  Procedures:  None  Antibiotics:  None  HPI/Subjective: The patient is sitting up in bed and complains of being nervous. Otherwise she has no complaints. Nursing reports that the patient has had urinary retention and an in and out catheter yielded 1800 cc of urine earlier this morning. No reported active diarrhea.  Objective: Filed Vitals:   02/18/14 0617  BP: 111/55  Pulse: 62  Temp: 97.9 F (36.6 C)  Resp: 18   oxygen saturation 96% on room air.  Intake/Output Summary (Last 24 hours) at 02/18/14 1335 Last data filed at 02/18/14 1214  Gross per 24 hour  Intake 1347.5 ml  Output   2000 ml  Net -652.5 ml   Filed Weights   02/16/14 1629 02/17/14 1200 02/18/14 0617  Weight: 66.2 kg (145 lb 15.1 oz) 67.4 kg (148 lb 9.4 oz) 66.3 kg (146 lb 2.6 oz)    Exam:   General:  Debilitated appearing 80 year old woman sitting up in bed, in no acute distress.  Cardiovascular: S1, S2, with a soft systolic murmur.  Respiratory: Clear anteriorly with decreased breath sounds in the bases. Breathing nonlabored.  Abdomen: Positive bowel sounds, soft, mildly tender and mildly distended over the bladder; no masses palpated.  Musculoskeletal/extremities: No acute hot red joints. No pedal edema. Left hip ulcer with dressing on (not taken off).  Neurologic/psychiatric: She is anxious. She is oriented to herself, hospital, and year. Cranial nerves II through XII appear to be grossly intact. She complains of being nervous and then starts a tremor of her right upper extremity which resolves with support.  Data Reviewed: Basic Metabolic Panel:  Recent Labs Lab 02/16/14 1235 02/17/14 0547  02/18/14 0519  NA 135 139 139  K 3.8 3.9 3.7  CL 101 105 109  CO2 25 22 24   GLUCOSE 97 79 81  BUN 47* 46* 33*  CREATININE 2.29* 2.02* 1.29*  CALCIUM 8.9 8.7 8.6   Liver Function Tests:  Recent Labs Lab 02/16/14 1235 02/17/14 0547  AST 22 19  ALT 18 16  ALKPHOS 66 64  BILITOT 0.9 0.8  PROT 5.9* 5.5*  ALBUMIN 3.4* 3.2*   No results for input(s): LIPASE, AMYLASE in the last 168 hours. No results for input(s): AMMONIA in the last 168 hours. CBC:  Recent Labs Lab 02/16/14 1208 02/17/14 0547  WBC 8.1 4.9  NEUTROABS 5.9  --   HGB 14.3 12.5  HCT 43.6 37.6  MCV 97.1 98.4  PLT 132* 127*   Cardiac Enzymes: No results for input(s): CKTOTAL, CKMB, CKMBINDEX, TROPONINI in the last 168 hours. BNP (last 3 results)  Recent Labs  09/07/13 0850 09/08/13 0506 09/08/13 1935  PROBNP 180.7 226.7 278.3   CBG: No results for input(s): GLUCAP in the last 168 hours.  Recent Results (from the past 240 hour(s))  MRSA PCR Screening     Status: None   Collection Time: 02/17/14 10:23 AM  Result Value Ref Range Status   MRSA by PCR NEGATIVE NEGATIVE Final    Comment:        The GeneXpert MRSA Assay (FDA approved for NASAL specimens only), is one component of a comprehensive MRSA colonization surveillance program. It is not intended to diagnose MRSA infection nor to guide or monitor treatment  for MRSA infections.      Studies: Dg Abd Acute W/chest  02/16/2014   CLINICAL DATA:  Recently diagnosed urinary tract infection. Taking antibiotics but not eating and drinking much. Dr. also could not get blood pressure on patient. Son also says that pt is "depressed and wants to die" however denies SI. Son says he feels like pt is "going down hill." Pt c/o pain all over. Pt unable to tolerate any repeats, all images done with pt in stretcher  EXAM: ACUTE ABDOMEN SERIES (ABDOMEN 2 VIEW & CHEST 1 VIEW)  COMPARISON:  02/08/2014  FINDINGS: Left-sided transvenous pacemaker leads overlie the  right atrium and right ventricle. The heart size is accentuated by the patient position. Lungs are clear. No pulmonary edema.  Bowel gas pattern is nonobstructive. Ectatic versus aneurysmal abdominal aorta. The urinary bladder is probably distended, displacing bowel gas from the pelvis.  IMPRESSION: 1.  No evidence for acute cardiopulmonary abnormality. 2. Nonobstructive bowel gas pattern.  Distended urinary bladder.   Electronically Signed   By: Shon Hale M.D.   On: 02/16/2014 13:46    Scheduled Meds: . aspirin EC  81 mg Oral q morning - 10a  . atenolol  50 mg Oral q morning - 10a  . donepezil  5 mg Oral q morning - 10a  . enoxaparin (LOVENOX) injection  30 mg Subcutaneous Q24H  . feeding supplement (RESOURCE BREEZE)  1 Container Oral TID BM  . levothyroxine  100 mcg Oral QAC breakfast  . pantoprazole  40 mg Oral Daily  . sertraline  50 mg Oral q morning - 10a   Continuous Infusions: . sodium chloride 75 mL/hr at 02/17/14 1409   Assessment and plan:  Principal Problem:   Diarrhea in adult patient Active Problems:   Acute on chronic renal failure   Dementia with behavioral disturbance   CKD (chronic kidney disease), stage III   Urinary retention   Chronic diastolic congestive heart failure   Cognitive decline   Decubitus ulcer of hip, stage 2   Essential hypertension   Cardiac pacemaker in situ   Thrombocytopenia   Protein-calorie malnutrition, severe   Hypothyroid   Anxiety state   1. Diarrhea. Apparently, the patient has had no significant diarrhea per nursing. C. difficile PCR and stool culture were ordered and are pending to rule out an infectious etiology. Could be medication induced from Zoloft and Aricept. We'll continue to monitor.  Acute renal failure superimposed on stage 2-3 chronic kidney disease. Apparently, the patient's baseline creatinine is 1.3-1.7. It was 2.29 on admission. This is likely prerenal from poor oral intake and diarrhea, but also consider  bladder outlet obstruction. Her renal function has improved and is now back to baseline, status post IV fluids. We'll decrease her IV fluids.  Urinary retention. The patient has had an in and out catheter 2 with 1 L and 1.8 L output. We'll order an indwelling Foley catheter, check an urinalysis, and renal ultrasound. We'll also check an abdominal x-[place] to assess for constipation. We refer her to urology outpatient. She will likely be discharged with a Foley catheter in place.  Overall failure to thrive/senile decline/dementia with behavioral disturbance/poor intake-anorexia. Previous hospitalists, Dr. Lyman Speller note regarding the patient's presentation and conversation with her family noted. It appears that the patient's overall decline is likely secondary to worsening dementia and possibly superimposed depression. She also has some issues with anxiety per my exam today. We'll continue Aricept and Zoloft. We'll add when necessary Xanax. The patient has  been referred to hospice and apparently they will see/assess her when she returns to her home facility upon discharge. We'll order a vitamin B12 level to rule out deficiency.  Protein calorie malnutrition/severe. She has had 10.4% weight loss 3 months, moderate to severe fat muscle depletion. We'll continue resource supplement beverage. Registered dietitian consulted for further recommendations.  Chronic diastolic heart failure. Appears to be compensated.  Hypertension. Currently stable on metoprolol.  Hypothyroidism. TSH is within normal limits. Continue Synthroid.  Left hip stage II decubitus ulcer. Wound care consult recommendations noted and appreciated. Patient currently has dressing oversight.  Thrombocytopenia. Etiology unknown. We'll order a vitamin B12 level to rule out deficiency.   Time spent: 35 minutes    [facility] Pager [phone]. If 7PM-7AM, please contact night-coverage at  [email or web address], password [facility] 02/18/2014, 1:35 PM  LOS: 2 days

## 2014-02-18 NOTE — Plan of Care (Signed)
Problem: Phase I Progression Outcomes Goal: Voiding-avoid urinary catheter unless indicated Outcome: Not Progressing Patient has been unable to void.  Foley placed today for retention

## 2014-02-18 NOTE — Progress Notes (Addendum)
14 Fr. Foley inserted, peri care performed before and after insertion.  Orange colored urine noted in tubing.  UA sent per orders.  Moisturizer and barrier cream applied afterwards as well for redness.  Patient also has poor appetite today.  Assisted with meals, but will only take a few bites before stating she has had enough.  Has drank about half of one resource drink.

## 2014-02-18 NOTE — Progress Notes (Signed)
I&O cath ordered this morning, night shift nurse was unable to do.  Was able to complete this morning with assist of NT.  Peri care completed prior to.  Upon I&O cath, 1800 cc orange cloudy urine resulted.  Peri care repeated after cath removed.  Peri area is reddened  - MASD.   Skin cleansed and moisturizer and barrier cream applied.  Will continue to use as needed for skin care.

## 2014-02-18 NOTE — Consult Note (Signed)
WOC wound consult note Reason for Consult:Area of Non-blanchable erythema (Stage I) with a partial thickness (Stage II) pressure ulcer embedded within on the left hip.   Wound type:Pressure Pressure Ulcer POA: Yes Measurement: 4.5cm x 10cm area of non-blanchable erythema with a 2cm x 2cm x 0.2cm partial thickness skin loss in the center.  Area is not warm to the touch or indurated. Wound bed: Denuded area is pink, moist and basement cell membrane (capillary buds) are evident  Drainage (amount, consistency, odor) scant serous; area is currently open to air Periwound:As described above. Dressing procedure/placement/frequency: I will implement covering this area with a soft silicone foam dressing (due to the size, I will suggest using our sacral dressing).  I have instructed the nursing staff by way of an order to peel back this dressing to assess for evolution/resolution of this area. Additionally, because the patient is at risk for other pressure ulcer formation due to mentation, incontinence, diminished oral intake and decreased likelihood to to change position independently, I have provided guidance to the nursing staff for turning and repositioning, provided bilateral pressure redistribution boots for her feet/heels and provided a pressure redistribution chair cushion for when she is again OOB in a chair. [facility] nursing team will not follow, but will remain available to this patient, the nursing and medical teams.  Please re-consult if needed. Thanks, Maudie Flakes, MSN, RN, [place], [place], [place] [phone])

## 2014-02-19 LAB — BASIC METABOLIC PANEL
Anion gap: 6 (ref 5–15)
BUN: 22 mg/dL (ref 6–23)
CALCIUM: 8.6 mg/dL (ref 8.4–10.5)
CO2: 22 mmol/L (ref 19–32)
CREATININE: 0.87 mg/dL (ref 0.50–1.10)
Chloride: 112 mEq/L (ref 96–112)
GFR calc Af Amer: 73 mL/min — ABNORMAL LOW (ref >90)
GFR, EST NON AFRICAN AMERICAN: 63 mL/min — AB (ref >90)
Glucose, Bld: 86 mg/dL (ref 70–99)
Potassium: 3.6 mmol/L (ref 3.5–5.1)
SODIUM: 140 mmol/L (ref 135–145)

## 2014-02-19 LAB — CBC
HEMATOCRIT: 35.3 % — AB (ref 36.0–46.0)
Hemoglobin: 11.9 g/dL — ABNORMAL LOW (ref 12.0–15.0)
MCH: 33.2 pg (ref 26.0–34.0)
MCHC: 33.7 g/dL (ref 30.0–36.0)
MCV: 98.6 fL (ref 78.0–100.0)
Platelets: 112 10*3/uL — ABNORMAL LOW (ref 150–400)
RBC: 3.58 MIL/uL — ABNORMAL LOW (ref 3.87–5.11)
RDW: 13.9 % (ref 11.5–15.5)
WBC: 4.2 10*3/uL (ref 4.0–10.5)

## 2014-02-19 MED ORDER — LOPERAMIDE HCL 2 MG PO CAPS: 2.0000 mg | ORAL_CAPSULE | ORAL | Status: DC | PRN

## 2014-02-19 MED ORDER — ONDANSETRON HCL 4 MG PO TABS: 4.0000 mg | ORAL_TABLET | Freq: Four times a day (QID) | ORAL | Status: AC | PRN

## 2014-02-19 MED ORDER — TRAMADOL HCL 50 MG PO TABS: 50.0000 mg | ORAL_TABLET | Freq: Four times a day (QID) | ORAL | Status: AC | PRN

## 2014-02-19 MED ORDER — ALPRAZOLAM 0.25 MG PO TABS: 0.2500 mg | ORAL_TABLET | Freq: Four times a day (QID) | ORAL | Status: AC | PRN

## 2014-02-19 NOTE — Clinical Social Work Note (Signed)
Pt d/c today back to [facility]. Pt's son/administrator at facility is aware and agreeable. He will pick pt up this afternoon. Elta Guadeloupe is aware that pt will have foley. Hospice to begin services tomorrow. Elta Guadeloupe is aware that MD wants to speak to him before they leave.   Benay Pike, [place]

## 2014-02-19 NOTE — Discharge Summary (Signed)
Physician Discharge Summary  LEN KLUVER ZOX:[id number] DOB: 12/30/1934 DOA: 02/16/2014  PCP: Glo Herring., MD  Admit date: 02/16/2014 Discharge date: 02/19/2014  Time spent: Greater than 30 minutes  Recommendations for Outpatient Follow-up:  1. The patient will need to be referred to outpatient urology by her primary care physician per her insurance provider. 2. The patient is being discharged to assisted living with an indwelling Foley catheter. Recommend consistent Foley catheter care. 3. Outpatient hospice will evaluate the patient for management of her chronic and advancing dementia. 4. Recommend daily left hip ulcer management with wound care. 5. Recommend monitoring of bowel movement habit and if she does not have a bowel movement in the next 24-48 hours, consider Senokot or Dulcolax.  Discharge Diagnoses:    1. Progressive failure to thrive, secondary to worsening dementia. Hospice will evaluate the patient as an outpatient. 2. Dementia, possibly Alzheimer's, with behavioral disturbance and associated depression with anxiety. 3. Reported diarrhea, none present during hospitalization. 4. Acute renal failure secondary to prerenal azotemia/poor oral intake. 5. Reported chronic kidney disease, but the patient's creatinine normalized to 0.87 at the time of discharge. 6. Urinary retention of unknown etiology. Patient discharged with an indwelling Foley catheter. 7. Severe protein calorie malnutrition. Recommend supplemental nutritious beverages such as Ensure or Boost. 8. Cardiac pacemaker in situ. 9. Hypothyroidism. 10. Stage II left hip ulcer. 11. Chronic diastolic heart failure without decompensation. 12. Essential hypertension. 13. Thrombocytopenia of unknown etiology.   Discharge Condition: Stable  Diet recommendation: General as tolerated with supplemental beverages.  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 In the ED, she was afebrile and hemodynamically stable. Her lab data were significant for BUN of 47 and a creatinine of 2.29. Her chest x-Hostler revealed no acute cardiopulmonary abnormality. Her abdominal x-Makepeace revealed a distended urinary bladder and nonobstructive bowel gas pattern. Her urinalysis was unremarkable. She was admitted for further evaluation and management.  Hospital Course:   Diarrhea. Apparently, the patient has had no significant diarrhea per nursing. C. difficile PCR and stool culture were ordered, but she did not have diarrhea, so a specimen was not collected. It was thought that her diarrhea could've been medication induced from Zoloft or Aricept, but these medications were continued and she had no diarrhea. Recommend monitoring her bowel pattern at the assisted living and consider starting Dulcolax or Senokot if she has no bowel movement in the next 24-48 hours.  Acute renal failure superimposed chronic kidney disease. Apparently, the patient's baseline creatinine is 1.3-1.7. It was 2.29 on admission. This was likely prerenal from poor oral intake and diarrhea, but also consider bladder outlet obstruction. Her renal function has improved and is now back to better than baseline at 0.87, status post IV fluids and indwelling Foley catheter.  Urinary retention. The patient has had an in and out catheter 2 with 1 L and 1.8 L output. An indwelling Foley catheter was placed. A urinalysis and renal ultrasound were ordered for further evaluation. Her urinalysis revealed no sign of infection. Her renal ultrasound revealed bilateral renal cortical atrophy  with a left renal  cyst, but no hydronephrosis. Abdominal x-Escalona was ordered to rule out constipation and it was negative for constipation. She was discharged with indwelling Foley catheter left in place. Outpatient urology appointment was attempted, but apparently her insurance carrier requires that her PCP make the referral.  Overall failure to thrive/senile decline/dementia with behavioral disturbance/poor intake-anorexia. Previous hospitalist, Dr. Lyman Speller note regarding the patient's presentation and conversation with her family noted. It appears that the patient's overall decline is likely secondary to worsening dementia and possibly superimposed depression. She also has some issues with anxiety per my exam and observation. She was continued on Aricept and Zoloft. As needed Xanax was added. Vitamin B12 was ordered to rule out deficiency and it was within normal limits. The patient has been referred to hospice and apparently they will see/assess her when she returns to her home facility upon discharge.  Protein calorie malnutrition/severe.  She has had 10.4% weight loss 3 months, moderate to severe fat muscle depletion. She was started on nutritional supplement and her diet was liberalized. The patient's overall poor intake may be the consequence of advancing dementia.  Chronic diastolic heart failure. Appeared to have been  compensated.  Hypertension. Currently stable on atenolol.  Hypothyroidism. TSH was within normal limits. She was continued on Synthroid.  Left hip stage II decubitus ulcer. Wound care consult recommendations noted and appreciated. Her recommendations are as follows: Dressing procedure/placement/frequency: Cover this area with a soft silicone foam dressing;  peel back this dressing to assess for evolution/resolution of this area. Additionally, because the patient is at risk for other pressure ulcer formation due to mentation, incontinence, diminished oral intake and  decreased likelihood to to change position independently, guidance to the nursing staff for turning and repositioning and provide bilateral pressure redistribution boots for her feet/heels and provide a pressure redistribution chair cushion for when she is again OOB in a chair.   Thrombocytopenia. Etiology unknown. Her vitamin B12 level and TSH were within normal limits. Recommend continue monitoring in the outpatient setting. There was no evidence of bleeding.   Procedures:  None  Consultations:  None  Discharge Exam: Filed Vitals:   02/19/14 0459  BP: 133/68  Pulse: 71  Temp: 97.7 F (36.5 C)  Resp: 16    General: Elderly 80 year old woman sitting up in bed, anxious. Cardiovascular: S1, S2, no murmurs rubs or gallops. Respiratory: Decreased breath sounds in the bases, otherwise clear. Abdomen: Positive bowel sounds, soft, nontender, nondistended. Indwelling Foley catheter draining clear yellow urine.  Discharge Instructions   Discharge Instructions    Diet general    Complete by:  As directed      Discharge instructions    Complete by:  As directed   Recommend close Foley care as the patient is being discharged with an indwelling Foley catheter. She will need to be referred to outpatient urology, but this will need to be arranged by her primary care physician.     Increase activity slowly    Complete by:  As directed           Current Discharge Medication List    START taking these medications   Details  ALPRAZolam (XANAX) 0.25 MG tablet Take 1 tablet (0.25 mg total) by mouth every 6 (six) hours as needed for anxiety. Qty: 30 tablet, Refills: 0    loperamide (IMODIUM) 2 MG capsule Take 1 capsule (2 mg total) by mouth as needed for diarrhea or loose stools.    traMADol (ULTRAM) 50 MG tablet Take 1 tablet (50  mg total) by mouth every 6 (six) hours as needed for moderate pain. Qty: 30 tablet, Refills: 0      CONTINUE these medications which have CHANGED    Details  ondansetron (ZOFRAN) 4 MG tablet Take 1 tablet (4 mg total) by mouth every 6 (six) hours as needed for nausea or vomiting. Qty: 30 tablet, Refills: 1      CONTINUE these medications which have NOT CHANGED   Details  aspirin EC 81 MG tablet Take 81 mg by mouth every morning.     atenolol (TENORMIN) 50 MG tablet Take 50 mg by mouth every morning.     donepezil (ARICEPT) 5 MG tablet Take 5 mg by mouth every morning.     furosemide (LASIX) 20 MG tablet Take 20 mg by mouth daily.    levothyroxine (SYNTHROID, LEVOTHROID) 100 MCG tablet Take 100 mcg by mouth daily before breakfast.    pantoprazole (PROTONIX) 40 MG tablet TAKE 1 TABLET BY MOUTH ONCE DAILY. Qty: 28 tablet, Refills: 5    sertraline (ZOLOFT) 50 MG tablet Take 50 mg by mouth every morning.     acetaminophen (TYLENOL) 500 MG tablet Take 500 mg by mouth 4 (four) times daily as needed for moderate pain (pain).       STOP taking these medications     potassium chloride 20 MEQ/15ML (10%) SOLN      potassium chloride SA (K-DUR,KLOR-CON) 20 MEQ tablet      risperiDONE (RISPERDAL) 0.25 MG tablet      risperiDONE (RISPERDAL) 0.5 MG tablet      risperiDONE (RISPERDAL) 1 MG tablet      cephALEXin (KEFLEX) 500 MG capsule        No Known Allergies Follow-up Information    Follow up with Malka So, MD On 03/09/2014.   Specialty:  Urology   Why:  at 3:00 pm in [facility] information:   [place] [address] [phone]       Follow up with Glo Herring., MD On 02/26/2014.   Specialty:  Internal Medicine   Why:  at :9:45 am   Contact information:   [address] [place]  [address] [phone]        The results of significant diagnostics from this hospitalization (including imaging, microbiology, ancillary and laboratory) are listed below for reference.    Significant Diagnostic Studies: Dg Abd 1 View  02/18/2014   CLINICAL DATA:  Acute abdominal pain and  diarrhea. Initial encounter.  EXAM: ABDOMEN - 1 VIEW  COMPARISON:  02/16/2014 and prior studies  FINDINGS: The bowel gas pattern is normal. No radio-opaque calculi or other significant radiographic abnormality are seen. A metallic clip within the upper left pelvis is again identified.  IMPRESSION: Negative.   Electronically Signed   By: Hassan Rowan M.D.   On: 02/18/2014 18:14   US Renal  02/18/2014   CLINICAL DATA:  Urinary retention  EXAM: RENAL/URINARY TRACT ULTRASOUND COMPLETE  COMPARISON:  01/28/2008  FINDINGS: Right Kidney:  Length: 9.8 cm. Echogenicity is normal. There is no mass or hydronephrosis. There is cortical thinning.  Left Kidney:  Length: 10.2 cm. Normal echogenicity. Cortical thinning similar to the contralateral side. There is a lower pole cystic lesion measuring 31 x 32 x 37 mm. It does not show evidence of septation or mural nodule.  Bladder:  Not evaluated as there is a Foley catheter present  IMPRESSION: Bilateral renal cortical atrophy with left renal cyst. No hydronephrosis.   Electronically  Signed   By: Skipper Cliche M.D.   On: 02/18/2014 16:19   Dg Chest Portable 1 View  02/08/2014   CLINICAL DATA:  Shortness of breath for long time, recent weakness, loss of appetite, history dementia, hypertension, former smoker, GERD  EXAM: PORTABLE CHEST - 1 VIEW  COMPARISON:  Portable exam 1116 hr compared to 10/16/2013  FINDINGS: LEFT subclavian transvenous pacemaker leads project over RIGHT atrium and RIGHT ventricle.  Borderline enlargement of cardiac silhouette.  Mediastinal contours and pulmonary vascularity normal.  Emphysematous changes consistent with COPD.  No definite acute infiltrate, pleural effusion or pneumothorax.  Bones demineralized.  Old fracture of the posterior LEFT seventh rib.  IMPRESSION: COPD changes.  Pacemaker.  No acute abnormalities.   Electronically Signed   By: Lavonia Dana M.D.   On: 02/08/2014 11:51   Dg Abd Acute W/chest  02/16/2014   CLINICAL DATA:  Recently  diagnosed urinary tract infection. Taking antibiotics but not eating and drinking much. Dr. also could not get blood pressure on patient. Son also says that pt is "depressed and wants to die" however denies SI. Son says he feels like pt is "going down hill." Pt c/o pain all over. Pt unable to tolerate any repeats, all images done with pt in stretcher  EXAM: ACUTE ABDOMEN SERIES (ABDOMEN 2 VIEW & CHEST 1 VIEW)  COMPARISON:  02/08/2014  FINDINGS: Left-sided transvenous pacemaker leads overlie the right atrium and right ventricle. The heart size is accentuated by the patient position. Lungs are clear. No pulmonary edema.  Bowel gas pattern is nonobstructive. Ectatic versus aneurysmal abdominal aorta. The urinary bladder is probably distended, displacing bowel gas from the pelvis.  IMPRESSION: 1.  No evidence for acute cardiopulmonary abnormality. 2. Nonobstructive bowel gas pattern.  Distended urinary bladder.   Electronically Signed   By: Shon Hale M.D.   On: 02/16/2014 13:46    Microbiology: Recent Results (from the past 240 hour(s))  MRSA PCR Screening     Status: None   Collection Time: 02/17/14 10:23 AM  Result Value Ref Range Status   MRSA by PCR NEGATIVE NEGATIVE Final    Comment:        The GeneXpert MRSA Assay (FDA approved for NASAL specimens only), is one component of a comprehensive MRSA colonization surveillance program. It is not intended to diagnose MRSA infection nor to guide or monitor treatment for MRSA infections.      Labs: Basic Metabolic Panel:  Recent Labs Lab 02/16/14 1235 02/17/14 0547 02/18/14 0519 02/19/14 0521  NA 135 139 139 140  K 3.8 3.9 3.7 3.6  CL 101 105 109 112  CO2 25 22 24 22   GLUCOSE 97 79 81 86  BUN 47* 46* 33* 22  CREATININE 2.29* 2.02* 1.29* 0.87  CALCIUM 8.9 8.7 8.6 8.6   Liver Function Tests:  Recent Labs Lab 02/16/14 1235 02/17/14 0547  AST 22 19  ALT 18 16  ALKPHOS 66 64  BILITOT 0.9 0.8  PROT 5.9* 5.5*  ALBUMIN 3.4* 3.2*    No results for input(s): LIPASE, AMYLASE in the last 168 hours. No results for input(s): AMMONIA in the last 168 hours. CBC:  Recent Labs Lab 02/16/14 1208 02/17/14 0547 02/19/14 0521  WBC 8.1 4.9 4.2  NEUTROABS 5.9  --   --   HGB 14.3 12.5 11.9*  HCT 43.6 37.6 35.3*  MCV 97.1 98.4 98.6  PLT 132* 127* 112*   Cardiac Enzymes: No results for input(s): CKTOTAL, CKMB, CKMBINDEX, TROPONINI in the  last 168 hours. BNP: BNP (last 3 results)  Recent Labs  09/07/13 0850 09/08/13 0506 09/08/13 1935  PROBNP 180.7 226.7 278.3   CBG: No results for input(s): GLUCAP in the last 168 hours.     Signed:  Bessye Stith  [facility] 02/19/2014, 2:00 PM

## 2014-02-19 NOTE — Progress Notes (Signed)
Patients UO over night was 200 cc.  Approx 100 cc in foley bag at this time.  Bladder scan revealed 0 cc in bladder, though.  Foley appears to be draining correctly.

## 2014-02-19 NOTE — Progress Notes (Signed)
Quick Note:  Kari Hall, I reviewed hospital records. Patient does not need any further labs or stool studies. Appropriate studies done while inpatient. Diarrhea no longer an issue. Hospice to be initiated per discharge summary. ______

## 2014-02-21 LAB — STOOL CULTURE

## 2014-04-02 ENCOUNTER — Ambulatory Visit (INDEPENDENT_AMBULATORY_CARE_PROVIDER_SITE_OTHER): Payer: Commercial Managed Care - HMO | Admitting: Neurology

## 2014-04-02 ENCOUNTER — Encounter: Payer: Self-pay | Admitting: Neurology

## 2014-04-02 VITALS — BP 102/60 | HR 68 | Temp 98.2°F | Resp 20 | Ht 64.0 in

## 2014-04-02 DIAGNOSIS — F329 Major depressive disorder, single episode, unspecified: Secondary | ICD-10-CM

## 2014-04-02 DIAGNOSIS — F0391 Unspecified dementia with behavioral disturbance: Secondary | ICD-10-CM

## 2014-04-02 DIAGNOSIS — F03918 Unspecified dementia, unspecified severity, with other behavioral disturbance: Secondary | ICD-10-CM

## 2014-04-02 DIAGNOSIS — F32A Depression, unspecified: Secondary | ICD-10-CM

## 2014-04-02 MED ORDER — MEMANTINE HCL 10 MG PO TABS: ORAL_TABLET | ORAL | Status: AC

## 2014-04-02 NOTE — Patient Instructions (Signed)
There is probably dementia however it is uncertain how much is related to depression.  1.  Start Namenda (memantine) 10mg  tablets.  Take 1/2 tablet at bedtime for 7 days, then 1/2 tablet twice daily for 7 days, then 1/2 tablet in morning and 1 tablet at bedtime for 7 days, then 1 tablet twice daily.   Side effects include dizziness, headache, diarrhea or constipation.  Call with any questions or concerns. 2.  Discontinue Aricept. 3.  Continue Zoloft, Zyprexa and alprazolam as per psychiatry.   4.  Will need 24 hour care 5.  Consider neuropsychological testing, however I don't think she would be able to participate. 6.  Follow up in 6 months.

## 2014-04-02 NOTE — Progress Notes (Signed)
NEUROLOGY CONSULTATION NOTE  Kari Hall MRN: [id number] DOB: 02-Aug-1934  Referring provider: Dr. Gerarda Fraction Primary care provider: Dr. Gerarda Fraction  Reason for consult:  dementia  HISTORY OF PRESENT ILLNESS: Kari Hall is a 80 year old right-handed woman with Seidel atrial node dysfunction status post pacemaker insertion, depression, hypertension, chronic diastolic heart failure, hypothyroidism, who presents for dementia.  Records, labs and CT of head reviewed.  She is accompanied by her son who provides most of the history as patient is poor historian.  At least a year ago, she started exhibited subtle changes.  At that time, she began preparing meals for no reason.  Also, the quality of her cooking declined.  She was also confused about dates.  Prior to this, she was living by herself.  She was working as a Building control surveyor for elderly people.  Beginning about 6 to 8 months ago, she started have a fairly drastic decline.  Was no longer eating.  She was not taking her medications correctly.  She was not tending to her hygiene.  She became more irritable and angry.  Gradually, she became less ambulatory and has not been able to walk on her own for about 5 months.  She also complained of frequent abdominal pain with nausea and vomiting.  She called 911 due to feeling agitated and depressed.  In September, she moved to assisted living.  She requires assistance with all activities of daily living, such as hygiene, dressing and taking medications.  She is incontinent.  Her children handle her bills.  She sometimes thinks certain family members who are deceased are still living.  She is incorrect about her memory of certain events, both recent and remote.  She does not exhibit hallucinations.  She sleeps well.  She has no difficulty recognizing people.  She was diagnosed with dementia and was started on Aricept 5mg .  She is followed by a psychiatrist who diagnosed her with severe depression.  She was started on Zyprexa,  Xanax and sertraline, which has helped with agitation and has provided some stability.  Due to her chronic abdominal pain, nausea and poor appetite, she was evaluated by GI, with negative workup.  When she has to see doctors, she becomes more anxious which triggers her feeling ill.  Due to failure to thrive, she had been admitted to the hospital twice over the past few months, most recently in early January.  At that time, she had acute on chronic renal failure and diarrhea.  She was found to have a 10.4% weight loss over the previous 3 months.  She has had poor intake.  She was started on nutritional supplement.  She also had urinary retention and required an indwelling Foley catheter.  She had thrombocytopenia of unknown etiology.  B12 was 385.  TSH was 0.747.  UA was negative.  Most recent brain imaging was a CT of the head from 06/09/13, which showed generalized cerebral atrophy and chronic small vessel periventricular ischemic changes.  She is a retired Tour manager.  She had some college but never graduated.  She had a boyfriend with dementia, whom she care for until he passed away a couple of years ago.  There is no known family history of dementia but her sister exhibited unusual behavior, as per the son.  PAST MEDICAL HISTORY: Past Medical History  Diagnosis Date  . GERD (gastroesophageal reflux disease)   . Dyslipidemia   . Essential hypertension, benign   . Hypothyroidism   . IBS (irritable bowel syndrome)   .  Diverticulitis   . Sinoatrial node dysfunction   . Chronic diastolic heart failure   . Pacemaker   . Anxiety   . Back pain   . Kyphosis   . Dementia     PAST SURGICAL HISTORY: Past Surgical History  Procedure Laterality Date  . Cholecystectomy    . Appendectomy    . Colectomy      secondary to diverticulitis  . Ectopic pregnancy surgery    . Pacemaker placement    . Abdominal hysterectomy    . Colonoscopy  2003    Dr. Arnoldo Morale: few scattered diverticula in  transverse and descending colon  . ?thyroid surgery?    . Colonoscopy N/A 10/17/2013    Dr. Gala Romney: Noncritical Schatzki ring, not manipulated, 3 cm hiatal hernia  . Esophagogastroduodenoscopy N/A 10/17/2013    Dr. Gala Romney: Pancolonic diverticulosis, splenic flexure and ileocecal valve polyps removed, tubular adenomas. Next TCS 5 years if health permits    MEDICATIONS: Current Outpatient Prescriptions on File Prior to Visit  Medication Sig Dispense Refill  . acetaminophen (TYLENOL) 500 MG tablet Take 500 mg by mouth 4 (four) times daily as needed for moderate pain (pain).     Marland Kitchen ALPRAZolam (XANAX) 0.25 MG tablet Take 1 tablet (0.25 mg total) by mouth every 6 (six) hours as needed for anxiety. 30 tablet 0  . aspirin EC 81 MG tablet Take 81 mg by mouth every morning.     Marland Kitchen atenolol (TENORMIN) 50 MG tablet Take 50 mg by mouth every morning.     . furosemide (LASIX) 20 MG tablet Take 20 mg by mouth daily.    Marland Kitchen levothyroxine (SYNTHROID, LEVOTHROID) 100 MCG tablet Take 100 mcg by mouth daily before breakfast.    . loperamide (IMODIUM) 2 MG capsule Take 1 capsule (2 mg total) by mouth as needed for diarrhea or loose stools.    . ondansetron (ZOFRAN) 4 MG tablet Take 1 tablet (4 mg total) by mouth every 6 (six) hours as needed for nausea or vomiting. 30 tablet 1  . pantoprazole (PROTONIX) 40 MG tablet TAKE 1 TABLET BY MOUTH ONCE DAILY. 28 tablet 5  . sertraline (ZOLOFT) 50 MG tablet Take 50 mg by mouth every morning.     . traMADol (ULTRAM) 50 MG tablet Take 1 tablet (50 mg total) by mouth every 6 (six) hours as needed for moderate pain. 30 tablet 0  . [DISCONTINUED] ezetimibe-simvastatin (VYTORIN) 10-40 MG per tablet Take 1 tablet by mouth at bedtime.       No current facility-administered medications on file prior to visit.    ALLERGIES: No Known Allergies  FAMILY HISTORY: Family History  Problem Relation Age of Onset  . Stroke Mother   . Emphysema Father   . Colon cancer Neg Hx   . COPD  Brother   . Dementia Sister   . Hypertension Son   . Cancer Daughter     breast     SOCIAL HISTORY: History   Social History  . Marital Status: Divorced    Spouse Name: N/A  . Number of Children: 3  . Years of Education: N/A   Occupational History  . retired from post office    Social History Main Topics  . Smoking status: Former Smoker -- 0.50 packs/day for 45 years    Types: Cigarettes    Quit date: 02/24/1979  . Smokeless tobacco: Never Used  . Alcohol Use: No  . Drug Use: No  . Sexual Activity: No   Other Topics Concern  .  Not on file   Social History Narrative    REVIEW OF SYSTEMS: Constitutional: Decreased appetite, fatigue Eyes: No visual changes, double vision, eye pain Ear, nose and throat: No hearing loss, ear pain, nasal congestion, sore throat Cardiovascular: No chest pain, palpitations Respiratory:  No shortness of breath at rest or with exertion, wheezes GastrointestinaI: Frequent nausea and abdominal pain Genitourinary:  Incontinent Musculoskeletal:  No neck pain, back pain Integumentary: No rash, pruritus, skin lesions Neurological: as above Psychiatric: depression anxiety Endocrine: fatigue, decreased appetite, mood swings Hematologic/Lymphatic:  No anemia, purpura, petechiae. Allergic/Immunologic: no itchy/runny eyes, nasal congestion, recent allergic reactions, rashes  PHYSICAL EXAM: Filed Vitals:   04/02/14 1029  BP: 102/60  Pulse: 68  Temp: 98.2 F (36.8 C)  Resp: 20   General: No acute distress Head:  Normocephalic/atraumatic Eyes:  fundi unremarkable, without vessel changes, exudates, hemorrhages or papilledema. Neck: supple, no paraspinal tenderness, full range of motion Back: No paraspinal tenderness Heart: regular rate and rhythm Lungs: Clear to auscultation bilaterally. Vascular: No carotid bruits. Neurological Exam: Mental status: Exam is somewhat limited as she is not feeling well and therefore not as talkative.  She is  alert and oriented to person only, delayed recall poor, remote memory fair, fund of knowledge intact, attention and concentration impaired, speech fluent and not dysarthric, language intact. Montreal Cognitive Assessment  04/02/2014  Visuospatial/ Executive (0/5) 2  Naming (0/3) 3  Attention: Read list of digits (0/2) 1  Attention: Read list of letters (0/1) 1  Attention: Serial 7 subtraction starting at 100 (0/3) 0  Language: Repeat phrase (0/2) 1  Language : Fluency (0/1) 0  Abstraction (0/2) 1  Delayed Recall (0/5) 0  Orientation (0/6) 0  Total 9  Adjusted Score (based on education) 9   Cranial nerves: CN I: not tested CN II: pupils equal, round and reactive to light, visual fields intact, fundi unremarkable, without vessel changes, exudates, hemorrhages or papilledema. CN III, IV, VI:  full range of motion, no nystagmus, no ptosis CN V: facial sensation intact CN VII: upper and lower face symmetric CN VIII: hearing intact CN IX, X: gag intact, uvula midline CN XI: sternocleidomastoid and trapezius muscles intact CN XII: tongue midline Bulk & Tone: normal, no fasciculations. Motor:  Reduced effort.  At least 3/5 throughout Sensation:  Pinprick intact.  Vibration reduced in feet. Deep Tendon Reflexes:  1+ throughout except absent in ankles.  Toes downgoing. Finger to nose testing:  No dysmetrai Gait:  Needs assistance to stand up.  Cannot stand unassisted.  Unsteady when trying to step. Romberg positive.  IMPRESSION: 1.  Dementia with behavioral changes.  It is difficulty to categorize etiology.  Also, it is uncertain how much is related to depression.  Ideally, I would like to get neuropsychological testing, however I don't suspect she would be able to participate due to her anxiety and feeling ill. 2.  Depression  PLAN: 1.  We will discontinue Aricept and instead start Namenda to goal of 10mg  twice daily 2.  Continue follow up with psychiatry (sertraline, Zyprexa, Xanax as  needed) 3.  Will check another CT of the head to look for any dramatic changes over the past several months since she has progressed. 4.  Needs 24 hour care. 5.  Follow up in 6 months.  Thank you for allowing me to take part in the care of this patient.  Metta Clines, DO  CC:  Redmond School, MD

## 2014-04-02 NOTE — Addendum Note (Signed)
Addended by: Charyl Bigger E on: 04/02/2014 02:46 PM   Modules accepted: Orders

## 2014-04-03 ENCOUNTER — Telehealth: Payer: Self-pay | Admitting: *Deleted

## 2014-04-03 NOTE — Telephone Encounter (Signed)
Kari Hall and Kari Hall are aware that patient has a CT appt at [facility] on 04/09/14 at 1:45pm and form for PCP to fill out it at front desk .

## 2014-04-09 ENCOUNTER — Ambulatory Visit (HOSPITAL_COMMUNITY)
Admission: RE | Admit: 2014-04-09 | Discharge: 2014-04-09 | Disposition: A | Discharge status: Home / Self Care | Payer: Commercial Managed Care - HMO | Source: Ambulatory Visit | Attending: Neurology | Admitting: Neurology

## 2014-04-09 DIAGNOSIS — F329 Major depressive disorder, single episode, unspecified: Secondary | ICD-10-CM

## 2014-04-09 DIAGNOSIS — F32A Depression, unspecified: Secondary | ICD-10-CM

## 2014-04-09 DIAGNOSIS — F0391 Unspecified dementia with behavioral disturbance: Secondary | ICD-10-CM | POA: Diagnosis not present

## 2014-04-09 DIAGNOSIS — F03918 Unspecified dementia, unspecified severity, with other behavioral disturbance: Secondary | ICD-10-CM

## 2014-04-10 ENCOUNTER — Encounter: Payer: Self-pay | Admitting: *Deleted

## 2014-04-10 ENCOUNTER — Telehealth: Payer: Self-pay | Admitting: Neurology

## 2014-04-10 ENCOUNTER — Telehealth: Payer: Self-pay | Admitting: *Deleted

## 2014-04-10 ENCOUNTER — Other Ambulatory Visit: Payer: Self-pay | Admitting: *Deleted

## 2014-04-10 DIAGNOSIS — F0391 Unspecified dementia with behavioral disturbance: Secondary | ICD-10-CM

## 2014-04-10 NOTE — Telephone Encounter (Signed)
-----   Message from Dudley Major, DO sent at 04/10/2014  7:16 AM EST ----- CT of head does show progression of small vessel disease related to stroke risk factors.  She is already on aspirin.  She is on medication for cholesterol, which is managed by her PCP.  I would like to check a fasting lipid panel and carotid doppler. ----- Message -----    From: Rad Results In Interface    Sent: 04/09/2014   3:26 PM      To: Dudley Major, DO

## 2014-04-10 NOTE — Telephone Encounter (Signed)
Caretaker is aware of  CT results well will cancel the doppler and fax order to Arthur at hospice to do fasting lipid

## 2014-04-10 NOTE — Telephone Encounter (Signed)
Note faxed to Dr Redmond School at [phone] with confirmation received.

## 2014-04-10 NOTE — Telephone Encounter (Signed)
Daughter Kari Hall is aware and Lab slip has been faxed to [facility]

## 2014-04-24 ENCOUNTER — Telehealth: Payer: Self-pay | Admitting: Neurology

## 2014-04-24 NOTE — Telephone Encounter (Signed)
[name], pt's daughter called returning your call at 9:17AM. C/b [phone]

## 2014-04-24 NOTE — Telephone Encounter (Signed)
[name], pt's daughter called wanting to speak to a nurse regarding have pt's blood work order sent to her hospice care. Please call [name]. C/b [phone]

## 2014-04-28 ENCOUNTER — Telehealth: Payer: Self-pay | Admitting: *Deleted

## 2014-04-28 NOTE — Telephone Encounter (Signed)
Pt got a letter to have her device checked. She is in hospice care and is bed ridden.

## 2014-04-29 NOTE — Telephone Encounter (Signed)
LMOVM for pt to return call 

## 2014-04-30 NOTE — Telephone Encounter (Signed)
Spoke w/ pt son and he stated that pt is in hospice she is not doing well but is not close to end of life. Informed pt son in law that a new monitor and wirex has been ordered and he should have it no later than Jun 25, 2014 and he can send transmission once monitor is received. He verbalized understanding.

## 2014-05-01 NOTE — Telephone Encounter (Signed)
I spoke with [name] and faxed lab slip to [facility] for labs to be drawn

## 2014-05-01 NOTE — Telephone Encounter (Signed)
See previous documentation. Have you spoken with this patient? Is encounter ready to be closed? Please follow up / Gayleen Orem

## 2014-05-12 ENCOUNTER — Other Ambulatory Visit: Payer: Self-pay | Admitting: *Deleted

## 2014-05-12 MED ORDER — MEMANTINE HCL 10 MG PO TABS: 10.0000 mg | ORAL_TABLET | Freq: Two times a day (BID) | ORAL | Status: AC

## 2014-05-14 ENCOUNTER — Encounter: Payer: Self-pay | Admitting: *Deleted

## 2014-06-09 ENCOUNTER — Other Ambulatory Visit: Payer: Self-pay | Admitting: Gastroenterology

## 2014-06-10 ENCOUNTER — Encounter: Payer: Self-pay | Admitting: *Deleted

## 2014-07-17 ENCOUNTER — Encounter: Payer: Self-pay | Admitting: *Deleted

## 2014-08-21 NOTE — Patient Outreach (Deleted)
[facility]) Care Management  08/21/2014  Kari Hall [birth year] [id number]   Referral from [facility] Tier 4 list, assigned to Sherrin Daisy, RN for patient outreach.  Eugean Arnott L. Elbert Ewings [facility] Care Management Assistant [phone]

## 2014-10-01 ENCOUNTER — Ambulatory Visit: Payer: Self-pay | Admitting: Neurology

## 2014-11-18 NOTE — Patient Outreach (Signed)
[facility]) Care Management  11/18/2014  MAKAYLE KRAHN 1935-01-01 [id number]   Referral from [facility] tier 4 list, assigned to Jacqlyn Larsen, RN for patient outreach.  Ryenne Lynam L. Brilynn Biasi, [place] Care Management Assistant

## 2014-11-26 ENCOUNTER — Other Ambulatory Visit: Payer: Self-pay | Admitting: *Deleted

## 2014-11-26 ENCOUNTER — Encounter: Payer: Self-pay | Admitting: *Deleted

## 2014-11-26 NOTE — Consult Note (Signed)
11/26/14- Referral received from [place] 4 list, noted in epic pt is being followed by hospice. RN CM sent note to Damita Rhodie to notify.   Jacqlyn Larsen [facility], [place] Coordinator [phone]

## 2014-11-26 NOTE — Patient Outreach (Signed)
[facility]) Care Management  11/26/2014  LUA FENG 1935/01/31 [id number]   Notification received from Jacqlyn Larsen, RN to close case due to patient being followed by hospice.  Azriella Mattia L. Philipe Laswell, [place] Care Management Assistant

## 2015-03-26 ENCOUNTER — Emergency Department (HOSPITAL_COMMUNITY): Payer: Medicare Other

## 2015-03-26 ENCOUNTER — Encounter (HOSPITAL_COMMUNITY): Payer: Self-pay | Admitting: Cardiology

## 2015-03-26 ENCOUNTER — Emergency Department (HOSPITAL_COMMUNITY)
Admission: EM | Admit: 2015-03-26 | Discharge: 2015-03-26 | Disposition: A | Discharge status: Home / Self Care | Payer: Medicare Other | Attending: Emergency Medicine | Admitting: Emergency Medicine

## 2015-03-26 DIAGNOSIS — D649 Anemia, unspecified: Secondary | ICD-10-CM | POA: Insufficient documentation

## 2015-03-26 DIAGNOSIS — Z950 Presence of cardiac pacemaker: Secondary | ICD-10-CM | POA: Insufficient documentation

## 2015-03-26 DIAGNOSIS — I5032 Chronic diastolic (congestive) heart failure: Secondary | ICD-10-CM | POA: Insufficient documentation

## 2015-03-26 DIAGNOSIS — Z87891 Personal history of nicotine dependence: Secondary | ICD-10-CM | POA: Insufficient documentation

## 2015-03-26 DIAGNOSIS — Z8739 Personal history of other diseases of the musculoskeletal system and connective tissue: Secondary | ICD-10-CM | POA: Insufficient documentation

## 2015-03-26 DIAGNOSIS — J019 Acute sinusitis, unspecified: Secondary | ICD-10-CM | POA: Diagnosis not present

## 2015-03-26 DIAGNOSIS — Z79899 Other long term (current) drug therapy: Secondary | ICD-10-CM | POA: Diagnosis not present

## 2015-03-26 DIAGNOSIS — K219 Gastro-esophageal reflux disease without esophagitis: Secondary | ICD-10-CM | POA: Insufficient documentation

## 2015-03-26 DIAGNOSIS — F419 Anxiety disorder, unspecified: Secondary | ICD-10-CM | POA: Insufficient documentation

## 2015-03-26 DIAGNOSIS — R410 Disorientation, unspecified: Secondary | ICD-10-CM | POA: Diagnosis not present

## 2015-03-26 DIAGNOSIS — Z87448 Personal history of other diseases of urinary system: Secondary | ICD-10-CM | POA: Insufficient documentation

## 2015-03-26 DIAGNOSIS — I10 Essential (primary) hypertension: Secondary | ICD-10-CM | POA: Insufficient documentation

## 2015-03-26 DIAGNOSIS — R4182 Altered mental status, unspecified: Secondary | ICD-10-CM | POA: Diagnosis present

## 2015-03-26 DIAGNOSIS — Z7982 Long term (current) use of aspirin: Secondary | ICD-10-CM | POA: Diagnosis not present

## 2015-03-26 DIAGNOSIS — E039 Hypothyroidism, unspecified: Secondary | ICD-10-CM | POA: Diagnosis not present

## 2015-03-26 DIAGNOSIS — F0390 Unspecified dementia without behavioral disturbance: Secondary | ICD-10-CM | POA: Diagnosis not present

## 2015-03-26 HISTORY — DX: Bed confinement status: Z74.01

## 2015-03-26 HISTORY — DX: Disorder of kidney and ureter, unspecified: N28.9

## 2015-03-26 HISTORY — DX: Heart failure, unspecified: I50.9

## 2015-03-26 LAB — COMPREHENSIVE METABOLIC PANEL
ALT: 24 U/L (ref 14–54)
ANION GAP: 9 (ref 5–15)
AST: 32 U/L (ref 15–41)
Albumin: 2.8 g/dL — ABNORMAL LOW (ref 3.5–5.0)
Alkaline Phosphatase: 93 U/L (ref 38–126)
BUN: 38 mg/dL — ABNORMAL HIGH (ref 6–20)
CALCIUM: 9 mg/dL (ref 8.9–10.3)
CO2: 26 mmol/L (ref 22–32)
CREATININE: 1.51 mg/dL — AB (ref 0.44–1.00)
Chloride: 103 mmol/L (ref 101–111)
GFR calc non Af Amer: 32 mL/min — ABNORMAL LOW (ref >60)
GFR, EST AFRICAN AMERICAN: 37 mL/min — AB (ref >60)
Glucose, Bld: 113 mg/dL — ABNORMAL HIGH (ref 65–99)
Potassium: 4.6 mmol/L (ref 3.5–5.1)
Sodium: 138 mmol/L (ref 135–145)
Total Bilirubin: 0.5 mg/dL (ref 0.3–1.2)
Total Protein: 6.3 g/dL — ABNORMAL LOW (ref 6.5–8.1)

## 2015-03-26 LAB — CBC
HCT: 27.2 % — ABNORMAL LOW (ref 36.0–46.0)
Hemoglobin: 8.8 g/dL — ABNORMAL LOW (ref 12.0–15.0)
MCH: 29.3 pg (ref 26.0–34.0)
MCHC: 32.4 g/dL (ref 30.0–36.0)
MCV: 90.7 fL (ref 78.0–100.0)
PLATELETS: 227 10*3/uL (ref 150–400)
RBC: 3 MIL/uL — AB (ref 3.87–5.11)
RDW: 13.5 % (ref 11.5–15.5)
WBC: 6 10*3/uL (ref 4.0–10.5)

## 2015-03-26 LAB — URINALYSIS, ROUTINE W REFLEX MICROSCOPIC
Bilirubin Urine: NEGATIVE
GLUCOSE, UA: NEGATIVE mg/dL
Hgb urine dipstick: NEGATIVE
KETONES UR: NEGATIVE mg/dL
LEUKOCYTES UA: NEGATIVE
Nitrite: NEGATIVE
PH: 5.5 (ref 5.0–8.0)
Protein, ur: NEGATIVE mg/dL
Specific Gravity, Urine: 1.02 (ref 1.005–1.030)

## 2015-03-26 LAB — TROPONIN I

## 2015-03-26 MED ORDER — OLANZAPINE 5 MG PO TBDP
5.0000 mg | ORAL_TABLET | Freq: Once | ORAL | Status: AC
  Administered 2015-03-26: 5 mg via ORAL
  Filled 2015-03-26: qty 1

## 2015-03-26 MED ORDER — AMOXICILLIN-POT CLAVULANATE 875-125 MG PO TABS: 1.0000 | ORAL_TABLET | Freq: Two times a day (BID) | ORAL | Status: AC

## 2015-03-26 MED ORDER — SODIUM CHLORIDE 0.9 % IV SOLN: INTRAVENOUS | Status: DC

## 2015-03-26 NOTE — ED Provider Notes (Signed)
CSN: [id number]     Arrival date & time 03/26/15  1414 History   First MD Initiated Contact with Patient 03/26/15 1443     Chief Complaint  Patient presents with  . Altered Mental Status      Patient is a 81 y.o. female presenting with altered mental status. The history is provided by a relative, the EMS personnel, the nursing home and the patient. The history is limited by the condition of the patient (Hx dementia).  Altered Mental Status Pt was seen at 1450. Per EMD, NH report, pt's family and pt: Pt has been "more confused" than her usual baseline dementia for the past 1 week. Family states the NH "checked for a UTI but didn't find one." Pt also "rolled out of bed" 2 days ago, hitting the right side of her neck and hand. Family is concerned regarding pt's continued "confusion." No reported fevers, no vomiting/diarrhea, no focal motor weakness.   Past Medical History  Diagnosis Date  . GERD (gastroesophageal reflux disease)   . Dyslipidemia   . Essential hypertension, benign   . Hypothyroidism   . IBS (irritable bowel syndrome)   . Diverticulitis   . Sinoatrial node dysfunction (HCC)   . Chronic diastolic heart failure ([place])   . Pacemaker   . Anxiety   . Back pain   . Kyphosis   . Dementia   . CHF (congestive heart failure) ([place])   . Renal disorder   . Dementia   . Bedridden    Past Surgical History  Procedure Laterality Date  . Cholecystectomy    . Appendectomy    . Colectomy      secondary to diverticulitis  . Ectopic pregnancy surgery    . Pacemaker placement    . Abdominal hysterectomy    . Colonoscopy  2003    Dr. Arnoldo Morale: few scattered diverticula in transverse and descending colon  . ?thyroid surgery?    . Colonoscopy N/A 10/17/2013    Dr. Gala Romney: Noncritical Schatzki ring, not manipulated, 3 cm hiatal hernia  . Esophagogastroduodenoscopy N/A 10/17/2013    Dr. Gala Romney: Pancolonic diverticulosis, splenic flexure and ileocecal valve polyps removed, tubular adenomas.  Next TCS 5 years if health permits  . Cardiac pacemaker placement     Family History  Problem Relation Age of Onset  . Stroke Mother   . Emphysema Father   . Colon cancer Neg Hx   . COPD Brother   . Dementia Sister   . Hypertension Son   . Cancer Daughter     breast    Social History  Substance Use Topics  . Smoking status: Former Smoker -- 0.50 packs/day for 45 years    Types: Cigarettes    Quit date: 02/24/1979  . Smokeless tobacco: Never Used  . Alcohol Use: No   OB History    Gravida Para Term Preterm AB TAB SAB Ectopic Multiple Living   3 3 3       3      Review of Systems  Unable to perform ROS: Dementia      Allergies  Review of patient's allergies indicates no known allergies.  Home Medications   Prior to Admission medications   Medication Sig Start Date End Date Taking? Authorizing Provider  acetaminophen (TYLENOL) 500 MG tablet Take 500 mg by mouth 4 (four) times daily as needed for moderate pain (pain).     Historical Provider, MD  ALPRAZolam Duanne Moron) 0.25 MG tablet Take 1 tablet (0.25 mg total) by mouth every  6 (six) hours as needed for anxiety. 02/19/14   Rexene Alberts, MD  aspirin EC 81 MG tablet Take 81 mg by mouth every morning.     Historical Provider, MD  atenolol (TENORMIN) 50 MG tablet Take 50 mg by mouth every morning.     Historical Provider, MD  furosemide (LASIX) 20 MG tablet Take 20 mg by mouth daily.    Historical Provider, MD  levothyroxine (SYNTHROID, LEVOTHROID) 100 MCG tablet Take 100 mcg by mouth daily before breakfast.    Historical Provider, MD  loperamide (IMODIUM) 2 MG capsule Take 1 capsule (2 mg total) by mouth as needed for diarrhea or loose stools. 02/19/14   Rexene Alberts, MD  memantine (NAMENDA) 10 MG tablet Take 0.5tab qhs x7 days, then 0.5tab BID x7 days, then 0.5tab qAM and 1 tab qhs x7 days, then 1tab BID 04/02/14   Pieter Partridge, DO  memantine (NAMENDA) 10 MG tablet Take 1 tablet (10 mg total) by mouth 2 (two) times daily. 05/12/14    Pieter Partridge, DO  OLANZapine zydis (ZYPREXA) 5 MG disintegrating tablet Take 5 mg by mouth at bedtime.    Historical Provider, MD  ondansetron (ZOFRAN) 4 MG tablet Take 1 tablet (4 mg total) by mouth every 6 (six) hours as needed for nausea or vomiting. 02/19/14   Rexene Alberts, MD  pantoprazole (PROTONIX) 40 MG tablet TAKE 1 TABLET BY MOUTH ONCE DAILY. 06/09/14   Orvil Feil, NP  potassium chloride SA (K-DUR,KLOR-CON) 20 MEQ tablet Take 20 mEq by mouth daily.    Historical Provider, MD  sertraline (ZOLOFT) 50 MG tablet Take 50 mg by mouth every morning.     Historical Provider, MD  traMADol (ULTRAM) 50 MG tablet Take 1 tablet (50 mg total) by mouth every 6 (six) hours as needed for moderate pain. 02/19/14   Rexene Alberts, MD   BP 123/73 mmHg  Pulse 91  Temp(Src) 98.1 F (36.7 C) (Oral)  Resp 18  Ht 5\' 5"  (1.651 m)  Wt 112 lb (50.803 kg)  BMI 18.64 kg/m2  SpO2 98% Physical Exam  1455: Physical examination:  Nursing notes reviewed; Vital signs and O2 SAT reviewed;  Constitutional: Well developed, Well nourished, In no acute distress; Head:  Normocephalic, atraumatic; Eyes: EOMI, PERRL, No scleral icterus; ENMT: Mouth and pharynx normal, Mucous membranes dry; Neck: Supple, Full range of motion, No lymphadenopathy; Cardiovascular: Regular rate and rhythm, No gallop; Respiratory: Breath sounds clear & equal bilaterally, No wheezes.  Speaking full sentences with ease, Normal respiratory effort/excursion; Chest: Nontender, Movement normal; Abdomen: Soft, Nontender, Nondistended, Normal bowel sounds; Genitourinary: No CVA tenderness; Extremities: Pulses normal, No tenderness, No edema, No calf edema or asymmetry.; Neuro: Awake, alert, confused per hx dementia. Major CN grossly intact. No facial droop. Speech clear. Grips equal. Pt moves all extremities spontaneously and to command without apparent gross focal motor deficits.; Skin: Color normal, Warm, Dry.   ED Course  Procedures (including critical care  time) Labs Review  Imaging Review  I have personally reviewed and evaluated these images and lab results as part of my medical decision-making.   EKG Interpretation   Date/Time:  Friday March 26 2015 15:59:49 EST Ventricular Rate:  82 PR Interval:  215 QRS Duration: 74 QT Interval:  380 QTC Calculation: 444 R Axis:   16 Text Interpretation:  Sinus or ectopic atrial rhythm Borderline prolonged  PR interval Low voltage, precordial leads Abnormal R-wave progression,  early transition Baseline wander Artifact When compared with ECG of  12/02/2013 No significant change was found Confirmed by [facility]  MD,  Nunzio Cory [phone]) on 03/26/2015 4:06:19 PM      MDM  MDM Reviewed: previous chart, nursing note and vitals Reviewed previous: labs and ECG Interpretation: labs, ECG, x-Schmelter and CT scan      Results for orders placed or performed during the hospital encounter of 03/26/15  Comprehensive metabolic panel  Result Value Ref Range   Sodium 138 135 - 145 mmol/L   Potassium 4.6 3.5 - 5.1 mmol/L   Chloride 103 101 - 111 mmol/L   CO2 26 22 - 32 mmol/L   Glucose, Bld 113 (H) 65 - 99 mg/dL   BUN 38 (H) 6 - 20 mg/dL   Creatinine, Ser 1.51 (H) 0.44 - 1.00 mg/dL   Calcium 9.0 8.9 - 10.3 mg/dL   Total Protein 6.3 (L) 6.5 - 8.1 g/dL   Albumin 2.8 (L) 3.5 - 5.0 g/dL   AST 32 15 - 41 U/L   ALT 24 14 - 54 U/L   Alkaline Phosphatase 93 38 - 126 U/L   Total Bilirubin 0.5 0.3 - 1.2 mg/dL   GFR calc non Af Amer 32 (L) >60 mL/min   GFR calc Af Amer 37 (L) >60 mL/min   Anion gap 9 5 - 15  CBC  Result Value Ref Range   WBC 6.0 4.0 - 10.5 K/uL   RBC 3.00 (L) 3.87 - 5.11 MIL/uL   Hemoglobin 8.8 (L) 12.0 - 15.0 g/dL   HCT 27.2 (L) 36.0 - 46.0 %   MCV 90.7 78.0 - 100.0 fL   MCH 29.3 26.0 - 34.0 pg   MCHC 32.4 30.0 - 36.0 g/dL   RDW 13.5 11.5 - 15.5 %   Platelets 227 150 - 400 K/uL  Troponin I  Result Value Ref Range   Troponin I <0.03 <0.031 ng/mL  Urinalysis, Routine w reflex  microscopic  Result Value Ref Range   Color, Urine YELLOW YELLOW   APPearance CLEAR CLEAR   Specific Gravity, Urine 1.020 1.005 - 1.030   pH 5.5 5.0 - 8.0   Glucose, UA NEGATIVE NEGATIVE mg/dL   Hgb urine dipstick NEGATIVE NEGATIVE   Bilirubin Urine NEGATIVE NEGATIVE   Ketones, ur NEGATIVE NEGATIVE mg/dL   Protein, ur NEGATIVE NEGATIVE mg/dL   Nitrite NEGATIVE NEGATIVE   Leukocytes, UA NEGATIVE NEGATIVE   Dg Chest 2 View 03/26/2015  CLINICAL DATA:  Altered mental status status post fall. EXAM: CHEST  2 VIEW COMPARISON:  Radiographs 02/16/2014 and 02/08/2014.  CT 09/08/2013. FINDINGS: 1518 hours. The patient is rotated to the right on the frontal examination. The heart size and mediastinal contours are stable with aortic atherosclerosis. Left subclavian pacemaker leads appear unchanged in the right atrium and right ventricle. The lungs are mildly hyperinflated but clear. There is no pleural effusion or pneumothorax. Old rib fractures are present bilaterally. Mild thoracic compression deformities appear unchanged. IMPRESSION: No acute cardiopulmonary process. Electronically Signed   By: Richardean Sale M.D.   On: 03/26/2015 15:56   Ct Head Wo Contrast 03/26/2015  CLINICAL DATA:  Confusion and possible fall. EXAM: CT HEAD WITHOUT CONTRAST CT CERVICAL SPINE WITHOUT CONTRAST TECHNIQUE: Multidetector CT imaging of the head and cervical spine was performed following the standard protocol without intravenous contrast. Multiplanar CT image reconstructions of the cervical spine were also generated. COMPARISON:  04/09/2014 FINDINGS: CT HEAD FINDINGS No mass effect or midline shift. No evidence of acute intracranial hemorrhage, or infarction. No abnormal extra-axial fluid collections. There is  moderate brain parenchymal atrophy and chronic small vessel disease changes. Basal cisterns are preserved. No depressed skull fractures. There is partial opacification of the left mastoid air cells. Associated periosteal  thickening suggest chronic mastoiditis. Air-fluid level and mucous material is also seen partially filling the bilateral maxillary and sphenoid sinuses. CT CERVICAL SPINE FINDINGS There is exaggerated cervical lordosis. There is no evidence for acute fracture or dislocation. Multilevel osteoarthritic changes are seen. Prevertebral soft tissues have a normal appearance. Lung apices have a normal appearance. IMPRESSION: No CT evidence of acute traumatic injury to the head or cervical spine. No acute intracranial abnormality. Moderate brain parenchymal atrophy and chronic microvascular disease. Acute sinusitis involving bilateral maxillary and sphenoid sinuses. Left mastoiditis, likely chronic. Electronically Signed   By: Fidela Salisbury M.D.   On: 03/26/2015 16:52   Ct Cervical Spine Wo Contrast 03/26/2015  CLINICAL DATA:  Confusion and possible fall. EXAM: CT HEAD WITHOUT CONTRAST CT CERVICAL SPINE WITHOUT CONTRAST TECHNIQUE: Multidetector CT imaging of the head and cervical spine was performed following the standard protocol without intravenous contrast. Multiplanar CT image reconstructions of the cervical spine were also generated. COMPARISON:  04/09/2014 FINDINGS: CT HEAD FINDINGS No mass effect or midline shift. No evidence of acute intracranial hemorrhage, or infarction. No abnormal extra-axial fluid collections. There is moderate brain parenchymal atrophy and chronic small vessel disease changes. Basal cisterns are preserved. No depressed skull fractures. There is partial opacification of the left mastoid air cells. Associated periosteal thickening suggest chronic mastoiditis. Air-fluid level and mucous material is also seen partially filling the bilateral maxillary and sphenoid sinuses. CT CERVICAL SPINE FINDINGS There is exaggerated cervical lordosis. There is no evidence for acute fracture or dislocation. Multilevel osteoarthritic changes are seen. Prevertebral soft tissues have a normal appearance.  Lung apices have a normal appearance. IMPRESSION: No CT evidence of acute traumatic injury to the head or cervical spine. No acute intracranial abnormality. Moderate brain parenchymal atrophy and chronic microvascular disease. Acute sinusitis involving bilateral maxillary and sphenoid sinuses. Left mastoiditis, likely chronic. Electronically Signed   By: Fidela Salisbury M.D.   On: 03/26/2015 16:52   Results for Kari Hall, Kari Hall (MRN [id number]) as of 03/26/2015 17:39  Ref. Range 02/08/2014 15:07 02/16/2014 12:08 02/17/2014 05:47 02/19/2014 05:21 03/26/2015 14:30  Hemoglobin Latest Ref Range: 12.0-15.0 g/dL 14.3 14.3 12.5 11.9 (L) 8.8 (L)  HCT Latest Ref Range: 36.0-46.0 % 42.0 43.6 37.6 35.3 (L) 27.2 (L)   Results for Kari Hall, Kari Hall (MRN [id number]) as of 03/26/2015 17:39  Ref. Range 02/08/2014 15:07 02/16/2014 12:35 02/17/2014 05:47 02/18/2014 05:19 02/19/2014 05:21 03/26/2015 14:30  BUN Latest Ref Range: 6-20 mg/dL 38 (H) 47 (H) 46 (H) 33 (H) 22 38 (H)  Creatinine Latest Ref Range: 0.44-1.00 mg/dL 1.70 (H) 2.29 (H) 2.02 (H) 1.29 (H) 0.87 1.51 (H)     1735:  BUN/Cr per baseline. H/H lower than 1 year ago:  Rectal exam performed w/permission of pt and ED RN chaperone present.  Anal tone normal.  Non-tender, soft brown stool in rectal vault, heme neg.  No fissures, no external hemorrhoids, no palp masses. VS remain stable, resps easy, abd soft/NT. No clear indication for admission at this time. Will tx for acute sinusitis. Pt will need PMD and GI f/u for H/H. Family is agreeable with this plan and would like her d/c back to NH. Dx and testing d/w pt's family.  Questions answered.  Verb understanding, agreeable to d/c home with outpt f/u.    Francine Graven, DO 03/29/15 2159

## 2015-03-26 NOTE — ED Notes (Signed)
[facility] called to transport pt back to [facility] in [place].

## 2015-03-26 NOTE — ED Notes (Signed)
Family at bedside.Pt in bed resting, questions answered for family member.

## 2015-03-26 NOTE — Discharge Instructions (Signed)
°Emergency Department Resource Guide °1) Find a Doctor and Pay Out of Pocket °Although you won't have to find out who is covered by your insurance plan, it is a good idea to ask around and get recommendations. You will then need to call the office and see if the doctor you have chosen will accept you as a new patient and what types of options they offer for patients who are self-pay. Some doctors offer discounts or will set up payment plans for their patients who do not have insurance, but you will need to ask so you aren't surprised when you get to your appointment. ° °2) Contact Your Local Health Department °Not all health departments have doctors that can see patients for sick visits, but many do, so it is worth a call to see if yours does. If you don't know where your local health department is, you can check in your phone book. The CDC also has a tool to help you locate your state's health department, and many state websites also have listings of all of their local health departments. ° °3) Find a Walk-in Clinic °If your illness is not likely to be very severe or complicated, you may want to try a walk in clinic. These are popping up all over the country in pharmacies, drugstores, and shopping centers. They're usually staffed by nurse practitioners or physician assistants that have been trained to treat common illnesses and complaints. They're usually fairly quick and inexpensive. However, if you have serious medical issues or chronic medical problems, these are probably not your best option. ° °No Primary Care Doctor: °- Call [facility] Connect at  832-8000 - they can help you locate a primary care doctor that  accepts your insurance, provides certain services, etc. °- Physician Referral Service- 1-800-533-3463 ° °Chronic Pain Problems: °Organization         Address  Phone   Notes  °[facility]  (336) 297-2271 Patients need to be referred by their primary care doctor.  ° °Medication  Assistance: °Organization         Address  Phone   Notes  °[place] [facility] [address] °[place], [place] [address] (336) 641-8030 --Must be a resident of [place] °-- Must have NO insurance coverage whatsoever (no Medicaid/ Medicare, etc.) °-- The pt. MUST have a primary care doctor that directs their care regularly and follows them in the community °  °[facility]  (866) 331-1348   °[employer]  (888) 892-1162   ° °Agencies that provide inexpensive medical care: °Organization         Address  Phone   Notes  °[facility]  (336) 832-8035   °[facility]    (336) 832-7272   °[facility] [address] °[place], [place] [address] (336) 832-4777   °[facility] [address], °[place] (336) 271-4999   °[facility]    (336) 373-0678   °[facility]    (336) 272-1050   °[facility] ° [address], [place] Phone:  (336) 832-4444, Fax:  (336) 832-4440 Hours of Operation:  9 am - 6 pm, M-F.  Also accepts Medicaid/Medicare and self-pay.  °[facility] ° [address], [place] Phone: (336) 832-3150, Fax: (336) [phone]. Hours of Operation:  8:30 am - 5:30 pm, M-F.  Also accepts Medicaid and self-pay.  °[facility] [place] [address], [place] Phone: (336) 878-6027   °[facility] [address], [place], [place] (336)723-1848, Ext. 123 Mondays & Thursdays: 7-9 AM.  First 15 patients are seen on a first come, first serve basis. °  ° °Medicaid-accepting [place] Providers: ° °Organization         Address  Phone   Notes  °[facility] [address], [place] (336) 641-2100 Also accepts self-pay patients.  °[facility] [address], [place] ° (336) 856-9996   °[facility] [address], [place]  (336) 288-8857   °[facility] [address] [place] [address], [place] (336) 299-7000   °[facility] [address], [place]  ° (336) 373-1557 Only accepts [place] Access Medicaid patients after they have their name applied to their card.  ° °Self-Pay (no insurance) in [place]: ° °Organization         Address  Phone   Notes  °Sickle Cell Patients, [facility] [address], [place] (336) 832-1970   °[facility] [address], [place] (336) 832-4400   °[facility] ° [address], [place] (336) 992-4800   °[facility]/Dr. Osei-Bonsu ° [address] [place] [address], [place] or [address], [place] (336) 841-8500 Phone number for both [place] and [place] locations is the same.  °[facility] [address], [place] (336) 299-0000   °[facility] [address] [place] [address], [place] or [address] (336) 852-7530 °(336) 878-2260   °[facility] [address], [place] (336) 350-1642, phone; (336) 294-5005, fax Sees patients 1st and 3rd Saturday of every month.  Must not qualify for public or private insurance (i.e. Medicaid, Medicare, [place] Health Choice, Veterans' Benefits) • Household income should be no more than 200% of the poverty level •The clinic cannot treat you if you are pregnant or think you are pregnant • Sexually transmitted diseases are not treated at the clinic.  ° ° °Dental Care: °Organization         Address  Phone  Notes  °[place] [facility] [address], [place] (336) 641-6152 Accepts children up to age 21 who are enrolled in Medicaid or [place] Health Choice; pregnant women with a Medicaid card; and children who have applied for Medicaid or [place] Health Choice, but were declined, whose parents can pay a reduced fee at time of service.  °[place]  [facility] [place]  [address], [place] (336) 641-7733 Accepts children up to age 21 who are enrolled in Medicaid or [place] Health Choice; pregnant women with a Medicaid card; and children who have applied for Medicaid or [place] Health Choice, but were declined, whose parents can pay a reduced fee at time of service.  °[facility] ° [address], [place] (336) 641-4533 Patients are seen by appointment only. Walk-ins are not accepted. [facility] will see patients 18 years of age and older. °Monday - Tuesday (8am-5pm) °Most Wednesdays (8:30-5pm) °$30 per visit, cash only  °[facility] ° [address], [place] (336) 641-4533 Patients are seen by appointment only. Walk-ins are not accepted. [facility] will see patients 18 years of age and older. °One   Wednesday Evening (Monthly: Volunteer Based).  $30 per visit, cash only  °[facility]  (919) 537-3737 for adults; Children under age 4, call Graduate Pediatric Dentistry at (919) [phone]. Children aged 4-14, please call (919) [phone] to request a pediatric application. ° Dental services are provided in all areas of dental care including fillings, crowns and bridges, complete and partial dentures, implants, gum treatment, root canals, and extractions. Preventive care is also provided. Treatment is provided to both adults and children. °Patients are selected via a lottery and there is often a waiting list. °  °[facility] [address], °[place] ° (336) 763-8833 [email or web address].drcivils.[email or web address] °  °[facility] [address], [place], [place] (336)723-1848, Ext. 123 Second and Fourth Thursday of each month, opens at 6:30 AM; Clinic ends at 9 AM.  Patients are seen on a first-come first-served basis, and a limited number are seen during each clinic.  ° °[facility] ° [address], [place], [place] (336) [phone]    Eligibility Requirements °You must have lived in [place], [place], or [place] counties for at least the last three months. °  You cannot be eligible for state or federal sponsored healthcare insurance, including Veterans Administration, Medicaid, or Medicare. °  You generally cannot be eligible for healthcare insurance through your employer.  °  How to apply: °Eligibility screenings are held every Tuesday and Wednesday afternoon from 1:00 pm until 4:00 pm. You do not need an appointment for the interview!  °[facility] [address], [place], [place] 336-631-2330   °[facility]  336-342-8273   °[facility]  336-703-3100   °[facility]  336-570-6415   ° °Behavioral Health Resources in the Community: °Intensive Outpatient Programs °Organization         Address  Phone  Notes  °[facility] [address], [place], [place] 336-878-6098   °[facility] [address], [place], [place] 336-832-9800   °[facility] [address], [place], [place] ° 336-882-2125   °[facility] [address],  °[place], [place] 1-800-853-5163 or 336-641-4981   °Substance Abuse Resources °Organization         Address  Phone  Notes  °[facility]  336-882-2125   °[facility]  336-784-9470   °[facility]  336-285-9073   °[facility]  336-845-3988   °Residential & Outpatient Substance Abuse Program  1-800-659-3381   °Psychological Services °Organization         Address  Phone  Notes  °[facility]  336- 832-9600   °[facility]  336- 378-7881   °[facility] [address], [place] 1-800-853-5163 or 336-641-4981   ° °Mobile Crisis Teams °Organization         Address  Phone  Notes  °[facility], Mobile Crisis Care Unit  1-877-626-1772   °Assertive °[facility] ° [address]  [place], [place] 336-834-9664   °Sharon DeEsch [address] °[place] 336-554-5454   ° °Self-Help/Support Groups °Organization         Address  Phone             Notes  °[facility]  - variety of support groups  336- 373-1402 Call for more information  °Narcotics Anonymous (NA), [facility] [address], °[place]  2 meetings at this location  ° °  Residential Treatment Programs Organization         Address  Phone  Notes  [facility] [address],    [place]  1-337-818-0074   [facility]  [address], [place] [id number], [place], [place]   [facility] [place], [place] 365-694-2659 Admissions: 8am-3pm M-F  [facility] [address]    [place], [place] [id number]   [facility] [address] [place], [place], [place]   [facility] [address],  [place], [place]   [facility] Intensive Outpatient [address], [place], [place]   [facility] ([place] [place]  [place], [place] 1-9712973509 or 516-154-1684   [facility] [address], [place], [place] Accepts Medicaid  [facility] [address]  [place] 1-479-073-9300 Substance Abuse/Addiction Treatment   [facility]         Address  Phone  Notes  [facility]  8566596862   Domenic Schwab, PhD [address] [place], [place]   (832)128-7073 or 848-624-4556   [facility] [place], [place] 215-843-8992   [facility] [address], [place], [place] (314)569-5126 Insurance/Medicaid/sponsorship through [facility] [address]                                    [place], [place] [phone] [place] [address] [place] [address], [place] [phone]    Dr. Adele Schilder  [phone]   [facility] Dept. 1) [address], [place] 2) [place] 3)  [address], [place] [phone] ([phone]  (815) [phone]   [place] 304-833-8940 or ([phone] (After Hours)      Take the prescription as directed. Your hemoglobin level was lower today than 1 year ago. Call your regular medical doctor on Monday to schedule a follow up appointment within the next 3 to 4 days to follow up this finding. Call the GI doctor on Monday to schedule a follow up appointment within the next week. Return to the Emergency Department immediately sooner if worsening.

## 2015-03-26 NOTE — ED Notes (Signed)
Pt confused times one week.  Also states she fell this week.  Per family she did not fall,  She was lowered to floor.

## 2015-03-26 NOTE — ED Notes (Signed)
Unable to get orthostatics.  Pt bed ridden

## 2015-03-28 LAB — URINE CULTURE: CULTURE: NO GROWTH

## 2015-03-29 LAB — POC OCCULT BLOOD, ED: FECAL OCCULT BLD: NEGATIVE

## 2015-05-04 ENCOUNTER — Emergency Department (HOSPITAL_COMMUNITY): Payer: Medicare Other

## 2015-05-04 ENCOUNTER — Emergency Department (HOSPITAL_COMMUNITY)
Admission: EM | Admit: 2015-05-04 | Discharge: 2015-05-04 | Disposition: A | Discharge status: Home / Self Care | Payer: Medicare Other | Attending: Emergency Medicine | Admitting: Emergency Medicine

## 2015-05-04 ENCOUNTER — Encounter (HOSPITAL_COMMUNITY): Payer: Self-pay

## 2015-05-04 DIAGNOSIS — Z87891 Personal history of nicotine dependence: Secondary | ICD-10-CM | POA: Insufficient documentation

## 2015-05-04 DIAGNOSIS — I509 Heart failure, unspecified: Secondary | ICD-10-CM | POA: Diagnosis not present

## 2015-05-04 DIAGNOSIS — S42001A Fracture of unspecified part of right clavicle, initial encounter for closed fracture: Secondary | ICD-10-CM

## 2015-05-04 DIAGNOSIS — S4991XA Unspecified injury of right shoulder and upper arm, initial encounter: Secondary | ICD-10-CM | POA: Diagnosis present

## 2015-05-04 DIAGNOSIS — Y939 Activity, unspecified: Secondary | ICD-10-CM | POA: Insufficient documentation

## 2015-05-04 DIAGNOSIS — I110 Hypertensive heart disease with heart failure: Secondary | ICD-10-CM | POA: Diagnosis not present

## 2015-05-04 DIAGNOSIS — Y929 Unspecified place or not applicable: Secondary | ICD-10-CM | POA: Insufficient documentation

## 2015-05-04 DIAGNOSIS — E039 Hypothyroidism, unspecified: Secondary | ICD-10-CM | POA: Insufficient documentation

## 2015-05-04 DIAGNOSIS — F0390 Unspecified dementia without behavioral disturbance: Secondary | ICD-10-CM | POA: Insufficient documentation

## 2015-05-04 DIAGNOSIS — X58XXXA Exposure to other specified factors, initial encounter: Secondary | ICD-10-CM | POA: Insufficient documentation

## 2015-05-04 DIAGNOSIS — S42021A Displaced fracture of shaft of right clavicle, initial encounter for closed fracture: Secondary | ICD-10-CM | POA: Diagnosis not present

## 2015-05-04 DIAGNOSIS — Y999 Unspecified external cause status: Secondary | ICD-10-CM | POA: Diagnosis not present

## 2015-05-04 NOTE — ED Notes (Signed)
EMS here for transport

## 2015-05-04 NOTE — ED Notes (Signed)
Called [facility] for transport back to [facility] in [place]. 

## 2015-05-04 NOTE — ED Notes (Signed)
Pt here from [facility] in [place] for evaluation of right clavicle fracture by x-[place]

## 2015-05-04 NOTE — ED Provider Notes (Signed)
CSN: [id number]     Arrival date & time 05/04/15  1445 History   First MD Initiated Contact with Patient 05/04/15 1505     Chief Complaint  Patient presents with  . Clavicle Injury     (Consider location/radiation/quality/duration/timing/severity/associated sxs/prior Treatment) HPI.... Level V caveat for dementia. Patient sent to the emergency department for evaluation of a right clavicle fracture. Unknown mechanism of injury. Patient is bedridden and has dementia. No other obvious bony tenderness. Her behavior is baseline.  Past Medical History  Diagnosis Date  . GERD (gastroesophageal reflux disease)   . Dyslipidemia   . Essential hypertension, benign   . Hypothyroidism   . IBS (irritable bowel syndrome)   . Diverticulitis   . Sinoatrial node dysfunction (H[facility])   . Chronic diastolic heart failure ([place])   . Pacemaker   . Anxiety   . Back pain   . Kyphosis   . Dementia   . CHF (congestive heart failure) ([place])   . Renal disorder   . Dementia   . Bedridden    Past Surgical History  Procedure Laterality Date  . Cholecystectomy    . Appendectomy    . Colectomy      secondary to diverticulitis  . Ectopic pregnancy surgery    . Pacemaker placement    . Abdominal hysterectomy    . Colonoscopy  2003    Dr. Arnoldo Morale: few scattered diverticula in transverse and descending colon  . ?thyroid surgery?    . Colonoscopy N/A 10/17/2013    Dr. Gala Romney: Noncritical Schatzki ring, not manipulated, 3 cm hiatal hernia  . Esophagogastroduodenoscopy N/A 10/17/2013    Dr. Gala Romney: Pancolonic diverticulosis, splenic flexure and ileocecal valve polyps removed, tubular adenomas. Next TCS 5 years if health permits  . Cardiac pacemaker placement     Family History  Problem Relation Age of Onset  . Stroke Mother   . Emphysema Father   . Colon cancer Neg Hx   . COPD Brother   . Dementia Sister   . Hypertension Son   . Cancer Daughter     breast    Social History  Substance Use Topics  . Smoking  status: Former Smoker -- 0.50 packs/day for 45 years    Types: Cigarettes    Quit date: 02/24/1979  . Smokeless tobacco: Never Used  . Alcohol Use: No   OB History    Gravida Para Term Preterm AB TAB SAB Ectopic Multiple Living   3 3 3       3      Review of Systems  Unable to perform ROS: Dementia (dementia)      Allergies  Review of patient's allergies indicates no known allergies.  Home Medications   Prior to Admission medications   Medication Sig Start Date End Date Taking? Authorizing Provider  acetaminophen (TYLENOL) 500 MG tablet Take 500 mg by mouth 4 (four) times daily as needed for moderate pain (pain).     Historical Provider, MD  ALPRAZolam Duanne Moron) 0.25 MG tablet Take 1 tablet (0.25 mg total) by mouth every 6 (six) hours as needed for anxiety. 02/19/14   Rexene Alberts, MD  amoxicillin-clavulanate (AUGMENTIN) 875-125 MG tablet Take 1 tablet by mouth every 12 (twelve) hours. 03/26/15   Francine Graven, DO  amoxicillin-clavulanate (AUGMENTIN) 875-125 MG tablet Take 1 tablet by mouth 2 (two) times daily. For UTI starting on 03/22/15 and to end 03/27/2015    Historical Provider, MD  aspirin EC 81 MG tablet Take 81 mg by mouth every morning.  Historical Provider, MD  cefTRIAXone (ROCEPHIN) 1 g injection Inject 1 g into the muscle once. Course started on 03/25/15 and to end 03/26/15    Historical Provider, MD  furosemide (LASIX) 20 MG tablet Take 10 mg by mouth daily.     Historical Provider, MD  levothyroxine (SYNTHROID, LEVOTHROID) 100 MCG tablet Take 100 mcg by mouth daily before breakfast.    Historical Provider, MD  memantine (NAMENDA) 10 MG tablet Take 0.5tab qhs x7 days, then 0.5tab BID x7 days, then 0.5tab qAM and 1 tab qhs x7 days, then 1tab BID Patient taking differently: Take 10 mg by mouth daily.  04/02/14   Pieter Partridge, DO  memantine (NAMENDA) 10 MG tablet Take 1 tablet (10 mg total) by mouth 2 (two) times daily. Patient not taking: Reported on 03/26/2015 05/12/14   Pieter Partridge, DO  mirtazapine (REMERON) 7.5 MG tablet Take 7.5 mg by mouth at bedtime.    Historical Provider, MD  Naphazoline HCl (CLEAR EYES OP) Apply 1 drop to eye 2 (two) times daily.    Historical Provider, MD  OLANZapine (ZYPREXA) 2.5 MG tablet Take 2.5 mg by mouth at bedtime.    Historical Provider, MD  ondansetron (ZOFRAN) 4 MG tablet Take 1 tablet (4 mg total) by mouth every 6 (six) hours as needed for nausea or vomiting. Patient not taking: Reported on 03/26/2015 02/19/14   Rexene Alberts, MD  potassium chloride SA (K-DUR,KLOR-CON) 20 MEQ tablet Take 20 mEq by mouth daily.    Historical Provider, MD  sertraline (ZOLOFT) 50 MG tablet Take 50 mg by mouth every morning.     Historical Provider, MD  traMADol (ULTRAM) 50 MG tablet Take 1 tablet (50 mg total) by mouth every 6 (six) hours as needed for moderate pain. 02/19/14   Rexene Alberts, MD   BP 135/70 mmHg  Pulse 77  Temp(Src) 98.5 F (36.9 C) (Oral)  Resp 16  SpO2 95% Physical Exam  Constitutional:  Pleasant, demented  HENT:  Head: Normocephalic and atraumatic.  Eyes: Conjunctivae are normal. Pupils are equal, round, and reactive to light.  Neck: Normal range of motion. Neck supple.  Cardiovascular: Normal rate and regular rhythm.   Pulmonary/Chest: Effort normal and breath sounds normal.  Tender over midshaft right clavicle  Abdominal: Soft. Bowel sounds are normal.  Musculoskeletal: Normal range of motion.  Neurological:  Unable to do complete exam.  Skin: Skin is warm and dry.  Psychiatric:  Flat affect  Nursing note and vitals reviewed.   ED Course  Procedures (including critical care time) Labs Review Labs Reviewed - No data to display  Imaging Review Dg Clavicle Right  05/04/2015  CLINICAL DATA:  Pt states right shoulder pain/unsure of any injury-pt has dementia EXAM: RIGHT CLAVICLE - 2+ VIEWS COMPARISON:  None. FINDINGS: Fracture of the mid clavicle. Proximal fracture fragment displaced full shaft width cranially.  IMPRESSION: Acute clavicle fracture. Electronically Signed   By: Skipper Cliche M.D.   On: 05/04/2015 16:06   I have personally reviewed and evaluated these images and lab results as part of my medical decision-making.   EKG Interpretation None      MDM   Final diagnoses:  Right clavicle fracture, closed, initial encounter    Plain films of right clavicle reveal an acute midshaft fracture.  Patient is not a candidate for any aggressive orthopedic procedures. Will attempt to splint the clavicle and shoulder and follow-up with orthopedics.    [name], MD 05/04/15 [phone]

## 2015-05-04 NOTE — Discharge Instructions (Signed)
Clavicle Fracture A clavicle fracture is a broken collarbone. The collarbone is the long bone that connects your shoulder to your rib cage. A broken collarbone may be treated with a sling, a wrap, or surgery. Treatment depends on whether the broken ends of the bone are out of place or not. HOME CARE  Put ice on the injured area:  Put ice in a plastic bag.  Place a towel between your skin and the bag.  Leave the ice on for 20 minutes, 2-3 times a day.  If you have a wrap or splint:  Wear it all the time, and remove it only to take a bath or shower.  When you bathe or shower, keep your shoulder in the same place as when the sling or wrap is on.  Do not lift your arm.  If you have a wrap:  Another person must tighten it every day.  It should be tight enough to hold your shoulders back.  Make sure you have enough room to put your pointer finger between your body and the strap.  Loosen the wrap right away if you cannot feel your arm or your hands tingle.  Only take medicines as told by your doctor.  Avoid activities that make the injury or pain worse for 4-6 weeks after surgery.  Keep all follow-up appointments. GET HELP IF:  Your medicine is not making you feel less pain.  Your medicine is not making swelling better. GET HELP RIGHT AWAY IF:   Your cannot feel your arm.  Your arm is cold.  Your arm is a lighter color than normal. MAKE SURE YOU:   Understand these instructions.  Will watch your condition.  Will get help right away if you are not doing well or get worse.   This information is not intended to replace advice given to you by your health care provider. Make sure you discuss any questions you have with your health care provider.   Document Released: 07/19/2007 Document Revised: 02/04/2013 Document Reviewed: 12/23/2012 Elsevier Interactive Patient Education 2016 [place] confirms clavicle fracture. Will attempt to put her in a shoulder  immobilizer. Tylenol for pain. Ice pack. Follow-up with orthopedics surgeon. Phone number given.

## 2015-05-04 NOTE — ED Notes (Signed)
Patient screaming and keeps pulling off shoulder immobilizer.

## 2015-05-04 NOTE — ED Notes (Signed)
Report called Microbiologist at [facility]. Report given to patient's daughter- [name].

## 2015-05-04 NOTE — ED Notes (Signed)
Per EMS it will be awhile.

## 2015-05-04 NOTE — ED Notes (Signed)
Called [facility] to check status of transfer back to [facility]

## 2015-06-07 ENCOUNTER — Emergency Department (HOSPITAL_COMMUNITY): Payer: Medicare Other

## 2015-06-07 ENCOUNTER — Encounter (HOSPITAL_COMMUNITY): Payer: Self-pay | Admitting: Emergency Medicine

## 2015-06-07 ENCOUNTER — Emergency Department (HOSPITAL_COMMUNITY)
Admission: EM | Admit: 2015-06-07 | Discharge: 2015-06-07 | Disposition: A | Discharge status: Skilled Nursing Facility | Payer: Medicare Other | Attending: Emergency Medicine | Admitting: Emergency Medicine

## 2015-06-07 DIAGNOSIS — Z79899 Other long term (current) drug therapy: Secondary | ICD-10-CM | POA: Insufficient documentation

## 2015-06-07 DIAGNOSIS — Z87891 Personal history of nicotine dependence: Secondary | ICD-10-CM | POA: Diagnosis not present

## 2015-06-07 DIAGNOSIS — E039 Hypothyroidism, unspecified: Secondary | ICD-10-CM | POA: Diagnosis not present

## 2015-06-07 DIAGNOSIS — I5023 Acute on chronic systolic (congestive) heart failure: Secondary | ICD-10-CM | POA: Insufficient documentation

## 2015-06-07 DIAGNOSIS — I110 Hypertensive heart disease with heart failure: Secondary | ICD-10-CM | POA: Insufficient documentation

## 2015-06-07 DIAGNOSIS — R079 Chest pain, unspecified: Secondary | ICD-10-CM | POA: Diagnosis present

## 2015-06-07 LAB — URINALYSIS, ROUTINE W REFLEX MICROSCOPIC
Bilirubin Urine: NEGATIVE
GLUCOSE, UA: NEGATIVE mg/dL
Hgb urine dipstick: NEGATIVE
KETONES UR: NEGATIVE mg/dL
LEUKOCYTES UA: NEGATIVE
Nitrite: NEGATIVE
PROTEIN: NEGATIVE mg/dL
Specific Gravity, Urine: 1.01 (ref 1.005–1.030)
pH: 6 (ref 5.0–8.0)

## 2015-06-07 LAB — HEPATIC FUNCTION PANEL
ALT: 9 U/L — AB (ref 14–54)
AST: 15 U/L (ref 15–41)
Albumin: 3.6 g/dL (ref 3.5–5.0)
Alkaline Phosphatase: 126 U/L (ref 38–126)
BILIRUBIN DIRECT: 0.1 mg/dL (ref 0.1–0.5)
BILIRUBIN INDIRECT: 0.3 mg/dL (ref 0.3–0.9)
TOTAL PROTEIN: 6.6 g/dL (ref 6.5–8.1)
Total Bilirubin: 0.4 mg/dL (ref 0.3–1.2)

## 2015-06-07 LAB — BASIC METABOLIC PANEL
Anion gap: 7 (ref 5–15)
BUN: 26 mg/dL — ABNORMAL HIGH (ref 6–20)
CALCIUM: 9.4 mg/dL (ref 8.9–10.3)
CO2: 32 mmol/L (ref 22–32)
CREATININE: 1.22 mg/dL — AB (ref 0.44–1.00)
Chloride: 104 mmol/L (ref 101–111)
GFR calc non Af Amer: 41 mL/min — ABNORMAL LOW (ref >60)
GFR, EST AFRICAN AMERICAN: 48 mL/min — AB (ref >60)
Glucose, Bld: 80 mg/dL (ref 65–99)
Potassium: 4.4 mmol/L (ref 3.5–5.1)
SODIUM: 143 mmol/L (ref 135–145)

## 2015-06-07 LAB — CBC
HCT: 37.1 % (ref 36.0–46.0)
HEMOGLOBIN: 11.9 g/dL — AB (ref 12.0–15.0)
MCH: 29 pg (ref 26.0–34.0)
MCHC: 32.1 g/dL (ref 30.0–36.0)
MCV: 90.5 fL (ref 78.0–100.0)
PLATELETS: 134 10*3/uL — AB (ref 150–400)
RBC: 4.1 MIL/uL (ref 3.87–5.11)
RDW: 16.1 % — AB (ref 11.5–15.5)
WBC: 4.7 10*3/uL (ref 4.0–10.5)

## 2015-06-07 LAB — TROPONIN I

## 2015-06-07 NOTE — ED Notes (Signed)
Per pts daughter, residence called this morning stating that patient was complaining of chest pain.  Pts daughter states staff said they gave the pt x1 nitroglycerin, with no relief in pain.  Pts daughter states that this is pts baseline mental status.

## 2015-06-07 NOTE — ED Provider Notes (Addendum)
CSN: [id number]     Arrival date & time 06/07/15  1159 History  By signing my name below, I, Kari Hall, attest that this documentation has been prepared under the direction and in the presence of Milton Ferguson, MD. Electronically Signed: Terressa Hall, ED Scribe. 06/07/2015. 12:51 PM.  Chief Complaint  Patient presents with  . Chest Pain   Patient is a 81 y.o. female presenting with chest pain. The history is provided by the patient, the EMS personnel and a relative (daughter). No language interpreter was used.  Chest Pain Pain location:  L chest Pain quality: pressure   Pain severity:  No pain Progression:  Resolved Relieved by:  Nitroglycerin Associated symptoms: no abdominal pain, no back pain, no cough, no fatigue and no headache   Risk factors: hypertension   Risk factors: not female, not obese and not pregnant    PCP: Glo Herring., MD HPI Comments: Kari Hall is a 81 y.o. female, with PMHx noted below including dementia, HTN, chronic diastolic heart failure, pacemaker placement, who presents to the Emergency Department, via EMS from [facility] and accompanied by her daughter, complaining of atraumatic, intermittent chest pain onset 2 days ago. Associated Sx include intermittent urinary incontinence.` Pt denies any current chest pain, nausea, or any other Sx at this time. Pt's daughter reports she received a dose of nitroglycerin at the [facility] P[facility] to the ED.    Past Medical History  Diagnosis Date  . GERD (gastroesophageal reflux disease)   . Dyslipidemia   . Essential hypertension, benign   . Hypothyroidism   . IBS (irritable bowel syndrome)   . Diverticulitis   . Sinoatrial node dysfunction (HCC)   . Chronic diastolic heart failure ([place])   . Pacemaker   . Anxiety   . Back pain   . Kyphosis   . Dementia   . CHF (congestive heart failure) ([place])   . Renal disorder   . Dementia   . Bedridden    Past Surgical History  Procedure Laterality Date  .  Cholecystectomy    . Appendectomy    . Colectomy      secondary to diverticulitis  . Ectopic pregnancy surgery    . Pacemaker placement    . Abdominal hysterectomy    . Colonoscopy  2003    Dr. Arnoldo Morale: few scattered diverticula in transverse and descending colon  . ?thyroid surgery?    . Colonoscopy N/A 10/17/2013    Dr. Gala Romney: Noncritical Schatzki ring, not manipulated, 3 cm hiatal hernia  . Esophagogastroduodenoscopy N/A 10/17/2013    Dr. Gala Romney: Pancolonic diverticulosis, splenic flexure and ileocecal valve polyps removed, tubular adenomas. Next TCS 5 years if health permits  . Cardiac pacemaker placement     Family History  Problem Relation Age of Onset  . Stroke Mother   . Emphysema Father   . Colon cancer Neg Hx   . COPD Brother   . Dementia Sister   . Hypertension Son   . Cancer Daughter     breast    Social History  Substance Use Topics  . Smoking status: Former Smoker -- 0.50 packs/day for 45 years    Types: Cigarettes    Quit date: 02/24/1979  . Smokeless tobacco: Never Used  . Alcohol Use: No   OB History    Gravida Para Term Preterm AB TAB SAB Ectopic Multiple Living   3 3 3       3      Review of Systems  Constitutional: Negative  for appetite change and fatigue.  HENT: Negative for congestion, ear discharge and sinus pressure.   Eyes: Negative for discharge.  Respiratory: Negative for cough.   Cardiovascular: Positive for chest pain.  Gastrointestinal: Negative for abdominal pain and diarrhea.  Genitourinary: Negative for frequency and hematuria.       Positive for urinary incontinence   Musculoskeletal: Negative for back pain.  Skin: Negative for rash.  Neurological: Negative for seizures and headaches.  Psychiatric/Behavioral: Negative for hallucinations.   Allergies  Review of patient's allergies indicates no known allergies.  Home Medications   Prior to Admission medications   Medication Sig Start Date End Date Taking? Authorizing Provider   acetaminophen (TYLENOL) 500 MG tablet Take 500 mg by mouth 4 (four) times daily as needed for moderate pain (pain).     Historical Provider, MD  ALPRAZolam Duanne Moron) 0.25 MG tablet Take 1 tablet (0.25 mg total) by mouth every 6 (six) hours as needed for anxiety. 02/19/14   Rexene Alberts, MD  amoxicillin-clavulanate (AUGMENTIN) 875-125 MG tablet Take 1 tablet by mouth every 12 (twelve) hours. 03/26/15   Francine Graven, DO  amoxicillin-clavulanate (AUGMENTIN) 875-125 MG tablet Take 1 tablet by mouth 2 (two) times daily. For UTI starting on 03/22/15 and to end 03/27/2015    Historical Provider, MD  aspirin EC 81 MG tablet Take 81 mg by mouth every morning.     Historical Provider, MD  cefTRIAXone (ROCEPHIN) 1 g injection Inject 1 g into the muscle once. Course started on 03/25/15 and to end 03/26/15    Historical Provider, MD  furosemide (LASIX) 20 MG tablet Take 10 mg by mouth daily.     Historical Provider, MD  levothyroxine (SYNTHROID, LEVOTHROID) 100 MCG tablet Take 100 mcg by mouth daily before breakfast.    Historical Provider, MD  memantine (NAMENDA) 10 MG tablet Take 0.5tab qhs x7 days, then 0.5tab BID x7 days, then 0.5tab qAM and 1 tab qhs x7 days, then 1tab BID Patient taking differently: Take 10 mg by mouth daily.  04/02/14   Pieter Partridge, DO  memantine (NAMENDA) 10 MG tablet Take 1 tablet (10 mg total) by mouth 2 (two) times daily. Patient not taking: Reported on 03/26/2015 05/12/14   Pieter Partridge, DO  mirtazapine (REMERON) 7.5 MG tablet Take 7.5 mg by mouth at bedtime.    Historical Provider, MD  Naphazoline HCl (CLEAR EYES OP) Apply 1 drop to eye 2 (two) times daily.    Historical Provider, MD  OLANZapine (ZYPREXA) 2.5 MG tablet Take 2.5 mg by mouth at bedtime.    Historical Provider, MD  ondansetron (ZOFRAN) 4 MG tablet Take 1 tablet (4 mg total) by mouth every 6 (six) hours as needed for nausea or vomiting. Patient not taking: Reported on 03/26/2015 02/19/14   Rexene Alberts, MD  potassium chloride  SA (K-DUR,KLOR-CON) 20 MEQ tablet Take 20 mEq by mouth daily.    Historical Provider, MD  sertraline (ZOLOFT) 50 MG tablet Take 50 mg by mouth every morning.     Historical Provider, MD  traMADol (ULTRAM) 50 MG tablet Take 1 tablet (50 mg total) by mouth every 6 (six) hours as needed for moderate pain. 02/19/14   Rexene Alberts, MD   Triage Vitals: BP 130/69 mmHg  Pulse 66  Temp(Src) 98.2 F (36.8 C) (Oral)  Resp 14  Ht 5\' 7"  (1.702 m)  Wt 115 lb (52.164 kg)  BMI 18.01 kg/m2  SpO2 97% Physical Exam  Constitutional: She is oriented to person, place, and  time. She appears well-developed.  HENT:  Head: Normocephalic.  Eyes: Conjunctivae and EOM are normal. No scleral icterus.  Neck: Neck supple. No thyromegaly present.  Cardiovascular: Normal rate and regular rhythm.  Exam reveals no gallop and no friction rub.   No murmur heard. Pulmonary/Chest: No stridor. She has no wheezes. She has no rales. She exhibits no tenderness.  Abdominal: She exhibits no distension. There is no tenderness. There is no rebound.  Musculoskeletal: Normal range of motion. She exhibits tenderness (mild tenderness left chest ). She exhibits no edema.  Lymphadenopathy:    She has no cervical adenopathy.  Neurological: She is oriented to person, place, and time. She exhibits normal muscle tone. Coordination normal.  Skin: No rash noted. No erythema.  Psychiatric: She has a normal mood and affect. Her behavior is normal.    ED Course  Procedures (including critical care time) DIAGNOSTIC STUDIES: Oxygen Saturation is 97% on ra, nl by my interpretation.    COORDINATION OF CARE: 12:46 PM: Discussed treatment plan with pt at bedside; patient verbalizes understanding and agrees with treatment plan.  Labs Review Labs Reviewed  CBC - Abnormal; Notable for the following:    Hemoglobin 11.9 (*)    RDW 16.1 (*)    Platelets 134 (*)    All other components within normal limits  BASIC METABOLIC PANEL  TROPONIN I   HEPATIC FUNCTION PANEL  URINALYSIS, ROUTINE W REFLEX MICROSCOPIC (NOT AT [facility])    Imaging Review No results found. I have personally reviewed and evaluated these images and lab results as part of my medical decision-making.   EKG Interpretation   Date/Time:  Monday June 07 2015 12:06:56 EDT Ventricular Rate:  67 PR Interval:  224 QRS Duration: 76 QT Interval:  427 QTC Calculation: 451 R Axis:   -20 Text Interpretation:  Sinus rhythm Prolonged PR interval Borderline left  axis deviation Probable anteroseptal infarct, old Confirmed by [email or web address]   MD, SCOTT [phone]) on 06/07/2015 12:21:38 PM      MDM   Final diagnoses:  None    Labs unremarkable EKG unremarkable patient feeling much better after this episode of chest pain. Patient will be discharged home to her nursing home.   Milton Ferguson, MD 06/07/15 1535  Milton Ferguson, MD 06/07/15 1537

## 2015-06-07 NOTE — ED Notes (Signed)
Per EMS, pt from [facility], pt was experiencing chest pain on Saturday, and pts daughter wanted her transported to hospital in order to be evaluatedb today for same .  Pt doesn't have any complaints of pain at this time. Pt on comfort care at facility, and pts DNR and MOST forms are at bedside.  EMS stated [facility] staff gave pt a tramadol x1 hr ago.

## 2015-06-07 NOTE — Discharge Instructions (Signed)
Follow up as needed

## 2016-05-21 IMAGING — CR DG CHEST 2V
2 series · 2 of 2 positions shown · non-contrast
Comparison: Chest radiograph 09/07/2013

CLINICAL DATA: Shortness of breath. Chronic diastolic heart
failure. Hypertension. Former smoker.

EXAM:
CHEST  2 VIEW

[view not recorded (1 of 2)]
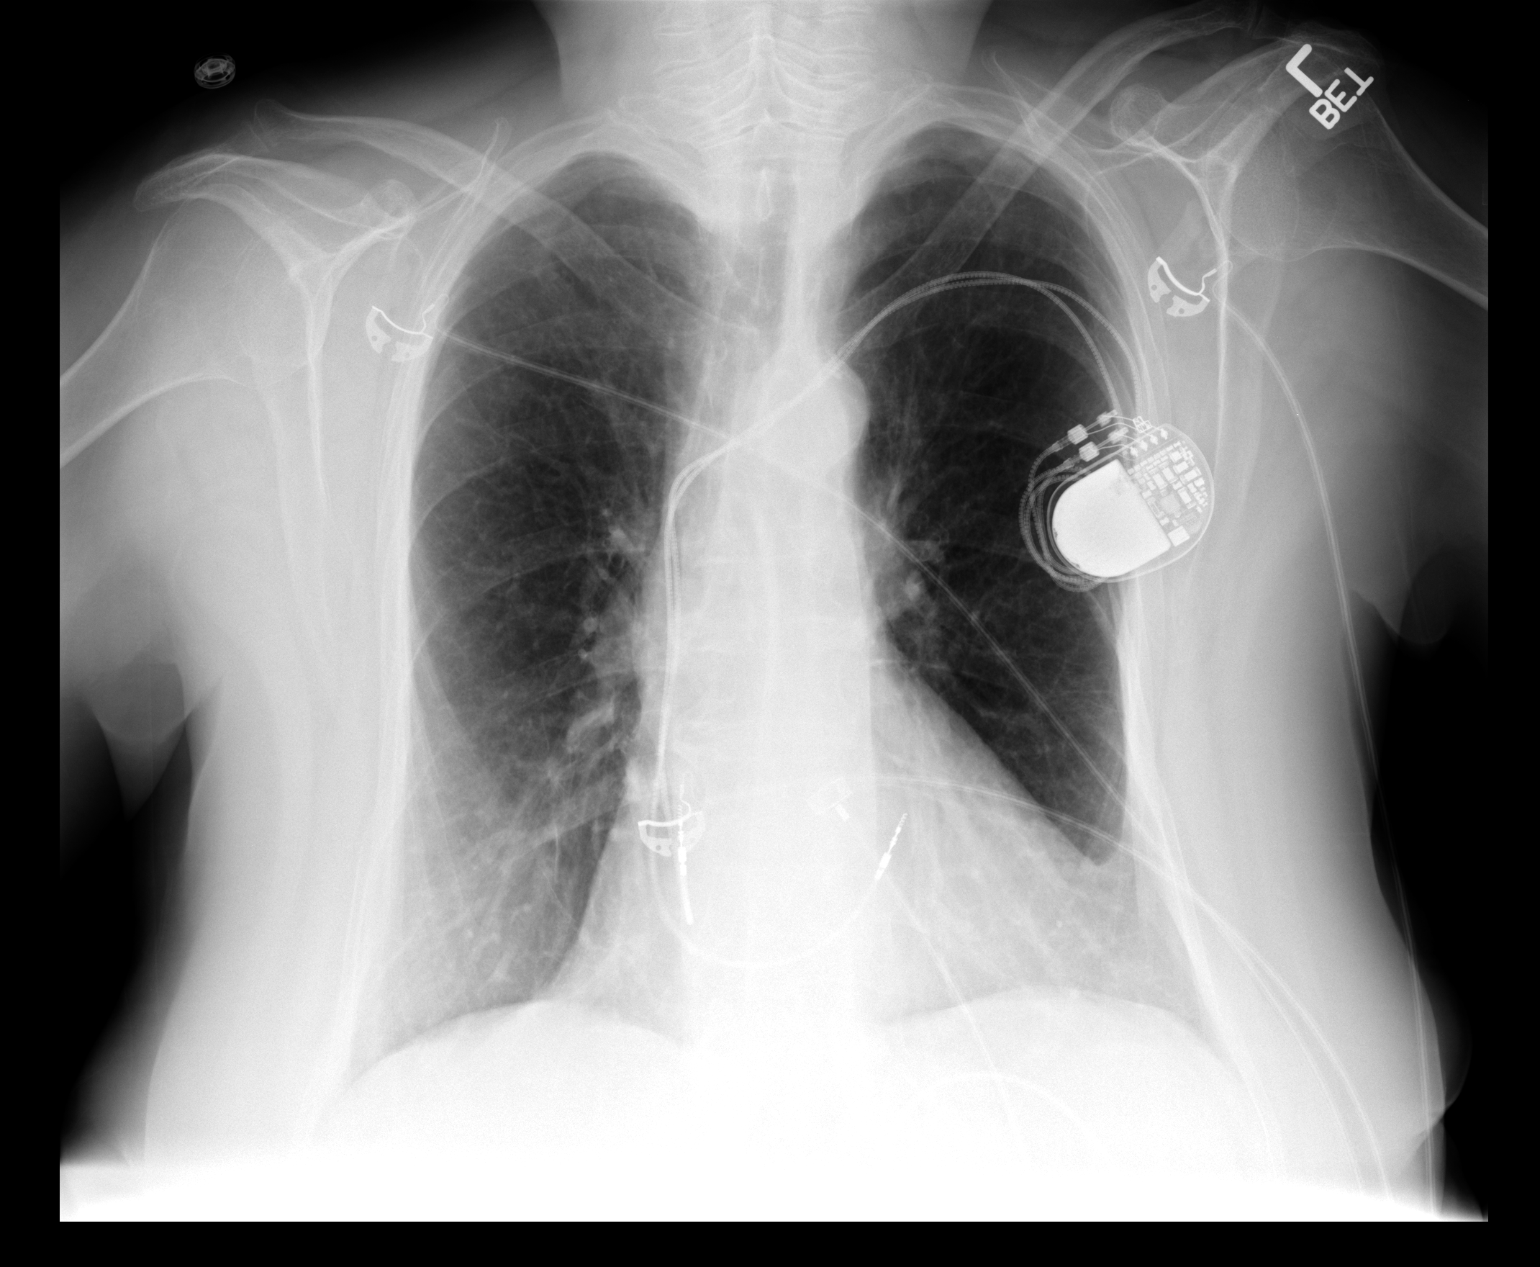

[view not recorded (2 of 2)]
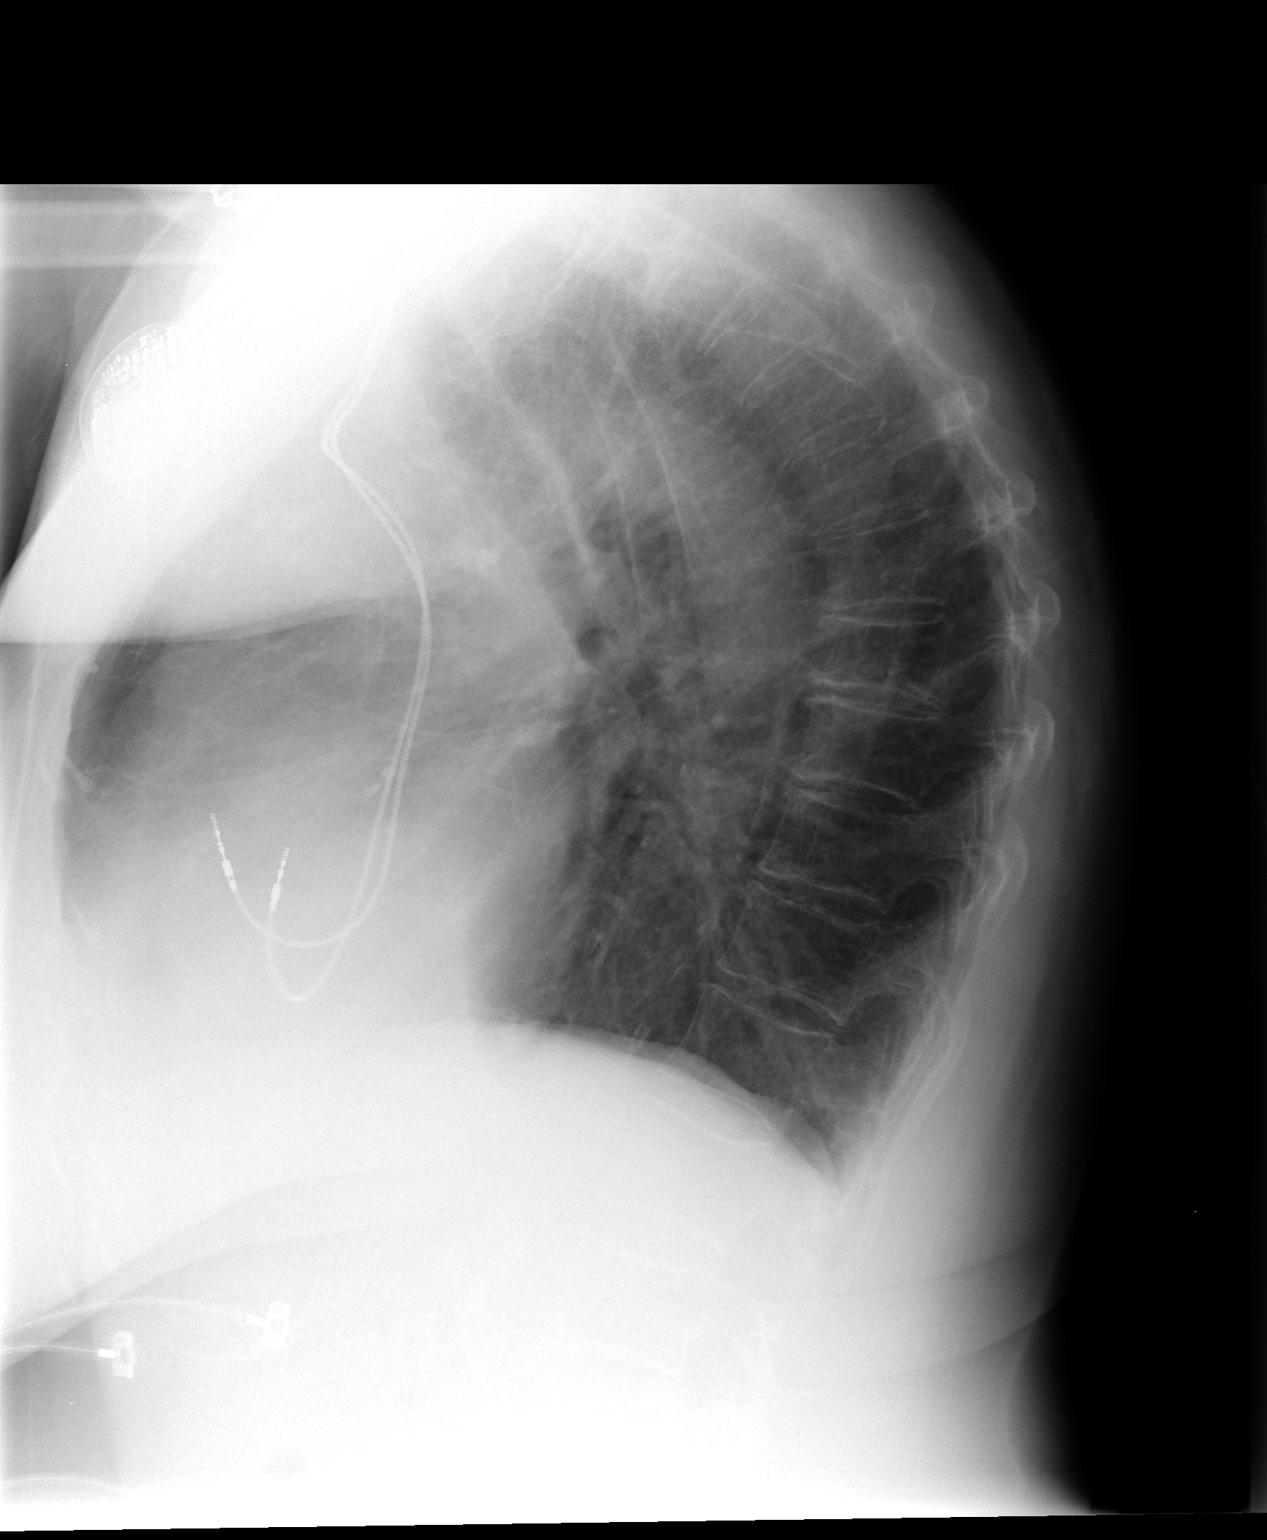

[2 of 2 positions shown; findings below may reference images not displayed]

FINDINGS: There is a left chest wall dual lead pacemaker with leads in the
expected location of the right atrium and right ventricle. Mild
cardiomegaly is stable. Mediastinal and hilar contours are within
normal limits. Pulmonary vascularity is normal patient is slightly
kyphotic in position. The lungs are clear and appear emphysematous.
No airspace disease, effusion, or pneumothorax.

Diffuse osteopenia of the bones.  No acute bony abnormality.
IMPRESSION: Suspect at emphysema.  No acute superimposed abnormality.

Stable mild cardiomegaly with dual lead pacemaker present.

## 2016-05-21 IMAGING — CT CT ANGIO CHEST
1 of 6 series · 5 of 36 positions shown · IV contrast (Omnipaque 300)
Comparison: None.

CLINICAL DATA: Shortness of breath for 3 days.

EXAM:
CT ANGIOGRAPHY CHEST WITH CONTRAST
TECHNIQUE: Multidetector CT imaging of the chest was performed using the
standard protocol during bolus administration of intravenous
contrast. Multiplanar CT image reconstructions and MIPs were
obtained to evaluate the vascular anatomy.
CONTRAST:  80mL OMNIPAQUE IOHEXOL 350 MG/ML SOLN

[Series 4: pe 3.0 b40f · axial · 0.52mm/px · z∈[-224,-56]mm · 5 of 86 slices shown]
[im 15/86  lung]
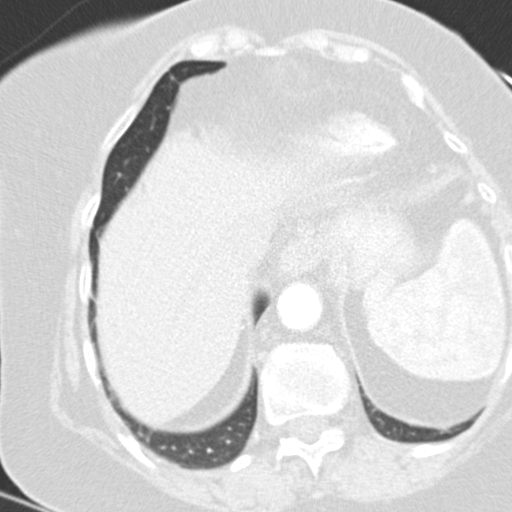
[im 29/86  mediastinal]
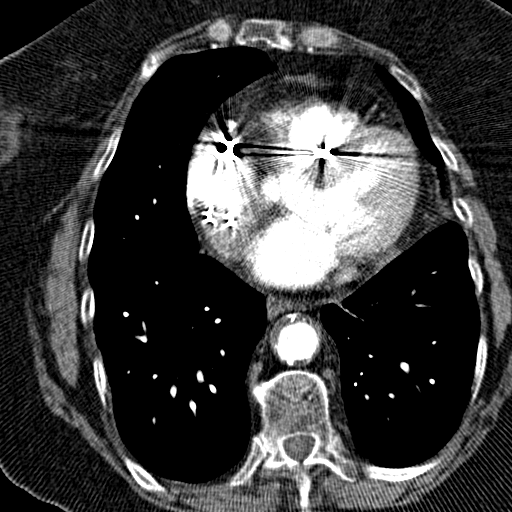
[im 43/86  lung]
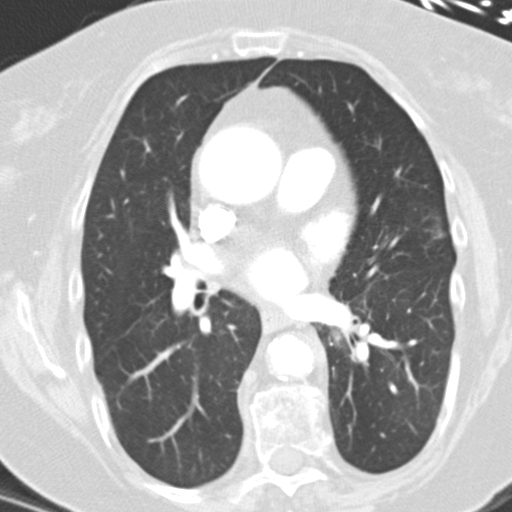
[im 57/86  mediastinal]
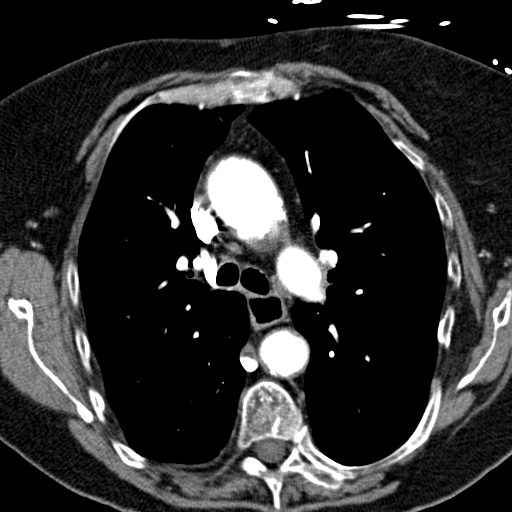
[im 71/86  lung]
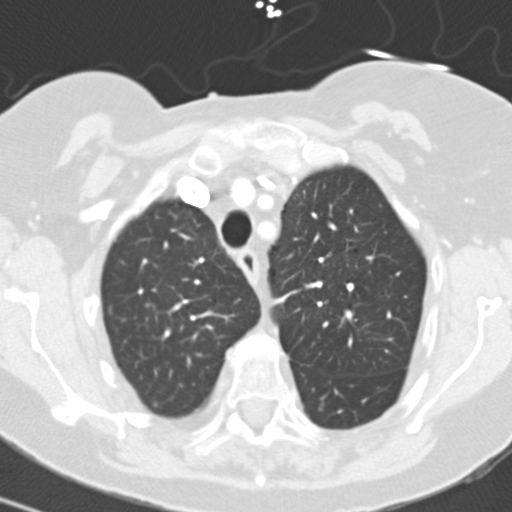

[5 of 36 positions shown; findings below may reference images not displayed]

FINDINGS: Technically adequate study with good opacification of the central
and segmental pulmonary arteries. No focal filling defects are
demonstrated. No evidence of significant pulmonary embolus.

Normal heart size. Coronary artery and aortic calcifications.
Ascending thoracic aorta is dilated with maximal AP diameter of
about 4 cm. No dissection. Mural plaque formation demonstrated
throughout the aortic arch and descending aorta. Great vessel
origins are patent. Esophagus is air-filled but not significantly
distended. No significant lymphadenopathy in the chest.

Scattered emphysematous changes in the lungs. No focal consolidation
or airspace disease. No pneumothorax. No pleural effusion. Airways
appear patent.

Visualized portions of the upper abdominal organs are grossly
unremarkable. Degenerative changes in the thoracic spine. No
destructive bone lesions are appreciated.

Review of the MIP images confirms the above findings.
IMPRESSION: No evidence of significant pulmonary embolus. Emphysematous changes
in the lungs. Ascending aortic dilatation with AP diameter of 4 cm.

## 2017-08-13 DEATH — deceased

## 2018-10-28 ENCOUNTER — Encounter: Payer: Self-pay | Admitting: Internal Medicine
